# Patient Record
Sex: Female | Born: 1995 | Race: White | Hispanic: No | Marital: Single | Smoking: Current every day smoker
Health system: Southern US, Community
[De-identification: ages and names within clinical notes are randomized; demographics above are authoritative.]

## PROBLEM LIST (undated history)

## (undated) ENCOUNTER — Inpatient Hospital Stay (HOSPITAL_COMMUNITY): Payer: Self-pay

## (undated) DIAGNOSIS — F909 Attention-deficit hyperactivity disorder, unspecified type: Secondary | ICD-10-CM

## (undated) DIAGNOSIS — F40240 Claustrophobia: Secondary | ICD-10-CM

## (undated) DIAGNOSIS — H7191 Unspecified cholesteatoma, right ear: Secondary | ICD-10-CM

## (undated) HISTORY — PX: WISDOM TOOTH EXTRACTION: SHX21

## (undated) HISTORY — PX: OTHER SURGICAL HISTORY: SHX169

---
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
## 2017-08-21 ENCOUNTER — Encounter (HOSPITAL_COMMUNITY): Payer: Self-pay | Admitting: *Deleted

## 2017-08-21 ENCOUNTER — Emergency Department (HOSPITAL_COMMUNITY): Payer: No Typology Code available for payment source

## 2017-08-21 ENCOUNTER — Emergency Department (HOSPITAL_COMMUNITY)
Admission: EM | Admit: 2017-08-21 | Discharge: 2017-08-21 | Disposition: A | Discharge status: Home / Self Care | Payer: No Typology Code available for payment source | Attending: Emergency Medicine | Admitting: Emergency Medicine

## 2017-08-21 DIAGNOSIS — Y92410 Unspecified street and highway as the place of occurrence of the external cause: Secondary | ICD-10-CM | POA: Insufficient documentation

## 2017-08-21 DIAGNOSIS — S1091XA Abrasion of unspecified part of neck, initial encounter: Secondary | ICD-10-CM | POA: Insufficient documentation

## 2017-08-21 DIAGNOSIS — S0091XA Abrasion of unspecified part of head, initial encounter: Secondary | ICD-10-CM | POA: Diagnosis not present

## 2017-08-21 DIAGNOSIS — S060X0A Concussion without loss of consciousness, initial encounter: Secondary | ICD-10-CM

## 2017-08-21 DIAGNOSIS — F17210 Nicotine dependence, cigarettes, uncomplicated: Secondary | ICD-10-CM | POA: Diagnosis not present

## 2017-08-21 DIAGNOSIS — S298XXA Other specified injuries of thorax, initial encounter: Secondary | ICD-10-CM | POA: Diagnosis not present

## 2017-08-21 DIAGNOSIS — S838X2A Sprain of other specified parts of left knee, initial encounter: Secondary | ICD-10-CM | POA: Diagnosis not present

## 2017-08-21 DIAGNOSIS — Y999 Unspecified external cause status: Secondary | ICD-10-CM | POA: Insufficient documentation

## 2017-08-21 DIAGNOSIS — Z79899 Other long term (current) drug therapy: Secondary | ICD-10-CM | POA: Diagnosis not present

## 2017-08-21 DIAGNOSIS — S838X1A Sprain of other specified parts of right knee, initial encounter: Secondary | ICD-10-CM | POA: Diagnosis not present

## 2017-08-21 DIAGNOSIS — Y9389 Activity, other specified: Secondary | ICD-10-CM | POA: Insufficient documentation

## 2017-08-21 DIAGNOSIS — S29001A Unspecified injury of muscle and tendon of front wall of thorax, initial encounter: Secondary | ICD-10-CM | POA: Diagnosis present

## 2017-08-21 MED ORDER — TETRACAINE HCL 0.5 % OP SOLN
1.0000 [drp] | Freq: Once | OPHTHALMIC | Status: AC
  Administered 2017-08-21: 2 [drp] via OPHTHALMIC
  Filled 2017-08-21: qty 4

## 2017-08-21 MED ORDER — IBUPROFEN 600 MG PO TABS: 600.0000 mg | ORAL_TABLET | Freq: Three times a day (TID) | ORAL | 0 refills | Status: DC | PRN

## 2017-08-21 MED ORDER — ERYTHROMYCIN 5 MG/GM OP OINT
TOPICAL_OINTMENT | Freq: Three times a day (TID) | OPHTHALMIC | Status: DC
  Administered 2017-08-21: 06:00:00 via OPHTHALMIC
  Filled 2017-08-21: qty 3.5

## 2017-08-21 MED ORDER — FENTANYL CITRATE (PF) 100 MCG/2ML IJ SOLN
50.0000 ug | Freq: Once | INTRAMUSCULAR | Status: AC
  Administered 2017-08-21: 50 ug via INTRAVENOUS
  Filled 2017-08-21: qty 2

## 2017-08-21 MED ORDER — FLUORESCEIN SODIUM 0.6 MG OP STRP
ORAL_STRIP | OPHTHALMIC | Status: AC
  Administered 2017-08-21: 1
  Filled 2017-08-21: qty 1

## 2017-08-21 NOTE — ED Notes (Signed)
Dried blood removed from pt forehead & brow. Abrasions cleaned & bleeding controled.
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
## 2017-08-21 NOTE — ED Notes (Signed)
Left eye flushed w/ 30ml of normal saline.

## 2017-08-21 NOTE — ED Notes (Signed)
Pt alert & oriented x4, stable gait. Patient given discharge instructions, paperwork & prescription(s). Patient  instructed to stop at the registration desk to finish any additional paperwork. Patient verbalized understanding. Pt left department w/ no further questions. 

## 2017-08-21 NOTE — ED Notes (Signed)
Pt ambulated around the nurses station w/o complication.
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
## 2018-06-09 ENCOUNTER — Other Ambulatory Visit: Payer: Self-pay | Admitting: Obstetrics and Gynecology

## 2018-06-09 DIAGNOSIS — O3680X0 Pregnancy with inconclusive fetal viability, not applicable or unspecified: Secondary | ICD-10-CM

## 2018-06-10 ENCOUNTER — Ambulatory Visit (INDEPENDENT_AMBULATORY_CARE_PROVIDER_SITE_OTHER): Payer: Medicaid Other

## 2018-06-10 ENCOUNTER — Encounter (INDEPENDENT_AMBULATORY_CARE_PROVIDER_SITE_OTHER): Payer: Self-pay

## 2018-06-10 ENCOUNTER — Other Ambulatory Visit: Payer: Self-pay | Admitting: Obstetrics and Gynecology

## 2018-06-10 ENCOUNTER — Other Ambulatory Visit: Payer: Medicaid Other

## 2018-06-10 DIAGNOSIS — O3680X0 Pregnancy with inconclusive fetal viability, not applicable or unspecified: Secondary | ICD-10-CM

## 2018-06-10 NOTE — Progress Notes (Signed)
US 6+1 wks,single IUP w/ys,crl 1.59 mm,no fht visualized,normal ovaries bilat,GS 1.76 cm,labs ordered today and f/u ultrasound in 10 days,per Victorino DikeJennifer

## 2018-06-11 LAB — BETA HCG QUANT (REF LAB): hCG Quant: 25438 m[IU]/mL

## 2018-06-11 LAB — PROGESTERONE: Progesterone: 6.2 ng/mL

## 2018-06-12 ENCOUNTER — Telehealth: Payer: Self-pay | Admitting: Adult Health

## 2018-06-12 NOTE — Telephone Encounter (Signed)
Left message to call about labs 

## 2018-06-13 ENCOUNTER — Telehealth: Payer: Self-pay | Admitting: Adult Health

## 2018-06-13 MED ORDER — PROGESTERONE MICRONIZED 200 MG PO CAPS: ORAL_CAPSULE | ORAL | 1 refills | Status: DC

## 2018-06-13 NOTE — Telephone Encounter (Signed)
Patient called stating that she is returning Jennifer's phone call regarding her lab work. Please contact pt

## 2018-06-13 NOTE — Telephone Encounter (Signed)
Pt aware of labs and that progesterone is low at 6.2, will rx Prometrium 200 mg in vagina at hs, keep US appt

## 2018-06-20 ENCOUNTER — Other Ambulatory Visit: Payer: Self-pay | Admitting: Adult Health

## 2018-06-20 DIAGNOSIS — O3680X0 Pregnancy with inconclusive fetal viability, not applicable or unspecified: Secondary | ICD-10-CM

## 2018-06-23 ENCOUNTER — Ambulatory Visit (INDEPENDENT_AMBULATORY_CARE_PROVIDER_SITE_OTHER): Payer: Medicaid Other

## 2018-06-23 ENCOUNTER — Encounter: Payer: Self-pay | Admitting: Adult Health

## 2018-06-23 ENCOUNTER — Ambulatory Visit (INDEPENDENT_AMBULATORY_CARE_PROVIDER_SITE_OTHER): Payer: Medicaid Other | Admitting: Adult Health

## 2018-06-23 VITALS — BP 118/72 | HR 69 | Ht 67.0 in | Wt 199.0 lb

## 2018-06-23 DIAGNOSIS — O039 Complete or unspecified spontaneous abortion without complication: Secondary | ICD-10-CM

## 2018-06-23 DIAGNOSIS — Z3A01 Less than 8 weeks gestation of pregnancy: Secondary | ICD-10-CM | POA: Diagnosis not present

## 2018-06-23 DIAGNOSIS — O3680X0 Pregnancy with inconclusive fetal viability, not applicable or unspecified: Secondary | ICD-10-CM

## 2018-06-23 NOTE — Progress Notes (Signed)
  Subjective:     Patient ID: Monica Vazquez, female   DOB: 05/06/1996, 22 y.o.   MRN: [id number]  HPI Monica Vazquez is a 22 year old white female in for F/U US and US was essentially unchanged from 7/9, except sac more irregular   Review of Systems No bleeding Reviewed past medical,surgical, social and family history. Reviewed medications and allergies.     Objective:   Physical Exam BP 118/72 (BP Location: Left Arm, Patient Position: Sitting, Cuff Size: Normal)   Pulse 69   Ht 5\' 7"  (1.702 m)   Wt 199 lb (90.3 kg)   LMP 04/28/2018 (Exact Date)   BMI 31.17 kg/m    Discussed that US showed irregular GS at 6+4 weeks(Sze<dates), no real growth and CRL 1.88 mm, no FHM. Discussed options of following for now or cytotec, will discuss with partner and let me know. Assessment:     Miscarriage     Plan:    Call me in next day or so and let me know if wants to follow for now or wants Cytotec  Check QHCG and ABO RH F/U in 1 week Review handout on miscarriage

## 2018-06-23 NOTE — Patient Instructions (Signed)

## 2018-06-23 NOTE — Progress Notes (Signed)
T/V US: 6+4 wks irregular GS,crl 1.88 mm,no fht,normal ovaries bilat

## 2018-06-24 LAB — ABO/RH: RH TYPE: POSITIVE

## 2018-06-24 LAB — BETA HCG QUANT (REF LAB): hCG Quant: 7780 m[IU]/mL

## 2018-06-25 ENCOUNTER — Telehealth: Payer: Self-pay | Admitting: Adult Health

## 2018-06-25 ENCOUNTER — Inpatient Hospital Stay (HOSPITAL_COMMUNITY)
Admission: AD | Admit: 2018-06-25 | Discharge: 2018-06-26 | Disposition: A | Discharge status: Home / Self Care | Payer: Medicaid Other | Source: Ambulatory Visit | Attending: Obstetrics & Gynecology | Admitting: Obstetrics & Gynecology

## 2018-06-25 DIAGNOSIS — O021 Missed abortion: Secondary | ICD-10-CM | POA: Insufficient documentation

## 2018-06-25 DIAGNOSIS — Z3A08 8 weeks gestation of pregnancy: Secondary | ICD-10-CM | POA: Insufficient documentation

## 2018-06-25 DIAGNOSIS — O99341 Other mental disorders complicating pregnancy, first trimester: Secondary | ICD-10-CM | POA: Insufficient documentation

## 2018-06-25 DIAGNOSIS — F17210 Nicotine dependence, cigarettes, uncomplicated: Secondary | ICD-10-CM | POA: Insufficient documentation

## 2018-06-25 DIAGNOSIS — F909 Attention-deficit hyperactivity disorder, unspecified type: Secondary | ICD-10-CM | POA: Insufficient documentation

## 2018-06-25 DIAGNOSIS — O99331 Smoking (tobacco) complicating pregnancy, first trimester: Secondary | ICD-10-CM | POA: Insufficient documentation

## 2018-06-25 NOTE — Telephone Encounter (Signed)
Left message that blood type A+ and pregnancy numbers are dropping, and let me know if wants to continue to follow or wants cytotec, can let me know next week at visit.

## 2018-06-25 NOTE — MAU Note (Signed)
Pt states she is here for a 2nd opinion. States she was told that she is 6 weeks ago and was told that they could not find a heartbeat. States she had blood work for hormone level and was not told if they had gone up or down. Pt denies vaginal bleeding or pain. [facility]

## 2018-06-26 DIAGNOSIS — O021 Missed abortion: Secondary | ICD-10-CM | POA: Diagnosis not present

## 2018-06-26 DIAGNOSIS — Z3A08 8 weeks gestation of pregnancy: Secondary | ICD-10-CM | POA: Diagnosis not present

## 2018-06-26 DIAGNOSIS — F909 Attention-deficit hyperactivity disorder, unspecified type: Secondary | ICD-10-CM | POA: Diagnosis not present

## 2018-06-26 DIAGNOSIS — O99331 Smoking (tobacco) complicating pregnancy, first trimester: Secondary | ICD-10-CM | POA: Diagnosis not present

## 2018-06-26 DIAGNOSIS — O99341 Other mental disorders complicating pregnancy, first trimester: Secondary | ICD-10-CM | POA: Diagnosis not present

## 2018-06-26 DIAGNOSIS — F17210 Nicotine dependence, cigarettes, uncomplicated: Secondary | ICD-10-CM | POA: Diagnosis not present

## 2018-06-26 NOTE — MAU Provider Note (Signed)
History     CSN: [id number]  Arrival date and time: 06/25/18 2032   First Provider Initiated Contact with Patient 06/26/18 0004      Chief Complaint  Patient presents with  . Possible Pregnancy   Monica Vazquez is a 22 y.o. G1P0000 who is approx 8 weeks by LMP. She states that she is here for a second opinion. [facility] and per the patient she had an [name] and it was "too early", and then she had another [name] and "there was no heart beat". She states that she was offered cytotec by her OB office, but was not sure if she wanted to take it. She is here today for a second opinion. She denies any bleeding or pain.    OB History    Gravida  1   Para  0   Term  0   Preterm  0   AB  0   Living  0     SAB  0   TAB  0   Ectopic  0   Multiple  0   Live Births              Past Medical History:  Diagnosis Date  . ADHD (attention deficit hyperactivity disorder)   . 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 Claustrophobia     Past Surgical History:  Procedure Laterality Date  . implanon    . TYMPANOMASTOIDECTOMY Right 09/12/2015   Procedure: RIGHT MODIFIED RADICAL TYMPANOMASTOIDECTOMY;  Surgeon: Newman Pies, MD;  Location: [facility];  Service: ENT;  Laterality: Right;  . WISDOM TOOTH EXTRACTION      Family History  Problem Relation Age of Onset  . Hypertension Father   . Heart disease Father   . Renal cancer Father     Social History   Tobacco Use  . Smoking status: Current Every Day Smoker    Packs/day: 0.00    Years: 3.00    Pack years: 0.00    Types: Cigarettes  . Smokeless tobacco: Never Used  . Tobacco comment: smokes 3-4 cig daily  Substance Use Topics  . Alcohol use: No  . Drug use: No    Allergies:  Allergies  Allergen Reactions  . Hydrocodone Itching    SKIN BURNING    Medications Prior to Admission  Medication Sig Dispense Refill Last Dose  . progesterone (PROMETRIUM) 200 MG capsule Place 1  capsule in vagina at HS 30 capsule 1 Taking    Review of Systems  Constitutional: Negative for chills and fever.  Gastrointestinal: Negative for nausea and vomiting.  Genitourinary: Negative for dysuria, frequency, pelvic pain, vaginal bleeding and vaginal discharge.   Physical Exam   Blood pressure 126/79, pulse 97, temperature 98.2 F (36.8 C), temperature source Oral, resp. rate 18, height 5\' 7"  (1.702 m), weight 199 lb (90.3 kg), last menstrual period 04/28/2018, SpO2 97 %.  Physical Exam  Nursing note and vitals reviewed. Constitutional: She is oriented to person, place, and time. She appears well-developed and well-nourished. No distress.  HENT:  Head: Normocephalic.  Cardiovascular: Normal rate.  Respiratory: Effort normal.  GI: Soft. There is no tenderness. There is no rebound.  Neurological: She is alert and oriented to person, place, and time.  Skin: Skin is warm and dry.  Psychiatric: She has a normal mood and affect.    MAU Course  Procedures  MDM Reviewed [name] and HCG from 06/09/18 at [facility] and [name] and HCG  from 7/22 with the patient. Told her that this is indicative of a miscarriage, and that waiting or taking cytotec options at this time. Patient states that her office had not sent in cytotec. Offered to send in rx for her at this time. She declines at this time and states, "I need to think about this some more". Emotional support provided. Advised patient to FU with her OB office for further management as needed.   Assessment and Plan   1. Missed abortion    DC home Comfort measures reviewed  Bleeding precautions RX: none  Return to MAU as needed FU with OB as planned  Follow-up Information    [facility] Follow up.   Specialty:  Obstetrics and Gynecology Contact information: [address] [place] [id number] [phone]           Thressa ShellerHeather Pieter Fooks 06/26/2018, 12:08 AM

## 2018-06-26 NOTE — Discharge Instructions (Signed)

## 2018-06-30 ENCOUNTER — Encounter: Payer: Self-pay | Admitting: Adult Health

## 2018-06-30 ENCOUNTER — Ambulatory Visit (INDEPENDENT_AMBULATORY_CARE_PROVIDER_SITE_OTHER): Payer: Medicaid Other | Admitting: Adult Health

## 2018-06-30 VITALS — BP 111/76 | HR 75 | Ht 67.0 in | Wt 202.0 lb

## 2018-06-30 DIAGNOSIS — O039 Complete or unspecified spontaneous abortion without complication: Secondary | ICD-10-CM | POA: Diagnosis not present

## 2018-06-30 NOTE — Progress Notes (Signed)
  Subjective:     Patient ID: Monica Vazquez, female   DOB: 04/17/1996, 22 y.o.   MRN: [id number]  HPI Monica Vazquez is a 22 year old white female, she had US with no growth or FHM and QHCG dropping, no bleeding as of yet, ABO RH is A+.  Review of Systems No bleeding yet Reviewed past medical,surgical, social and family history. Reviewed medications and allergies.     Objective:   Physical Exam BP 111/76 (BP Location: Left Arm, Patient Position: Sitting, Cuff Size: Small)   Pulse 75   Ht 5\' 7"  (1.702 m)   Wt 202 lb (91.6 kg)   LMP 04/28/2018 (Exact Date)   BMI 31.64 kg/m  Skin warm and dry. Lungs: clear to ausculation bilaterally. Cardiovascular: regular rate and rhythm. She still wants to wait for now, [facility],     Assessment:     1. Miscarriage       Plan:     Check QHCG F/U in 2 weeks

## 2018-07-01 ENCOUNTER — Telehealth: Payer: Self-pay | Admitting: Adult Health

## 2018-07-01 LAB — BETA HCG QUANT (REF LAB): hCG Quant: 3329 m[IU]/mL

## 2018-07-01 NOTE — Telephone Encounter (Signed)
Pt aware that numbers have dropped to [phone],329, keep appt as scheduled.

## 2018-07-14 ENCOUNTER — Ambulatory Visit (INDEPENDENT_AMBULATORY_CARE_PROVIDER_SITE_OTHER): Payer: Self-pay | Admitting: Adult Health

## 2018-07-14 ENCOUNTER — Encounter (INDEPENDENT_AMBULATORY_CARE_PROVIDER_SITE_OTHER): Payer: Self-pay

## 2018-07-14 ENCOUNTER — Encounter: Payer: Self-pay | Admitting: Adult Health

## 2018-07-14 VITALS — BP 103/60 | HR 76 | Ht 67.0 in | Wt 203.0 lb

## 2018-07-14 DIAGNOSIS — O039 Complete or unspecified spontaneous abortion without complication: Secondary | ICD-10-CM

## 2018-07-14 NOTE — Progress Notes (Signed)
  Subjective:     Patient ID: Monica Vazquez, female   DOB: 05/17/1996, 22 y.o.   MRN: [id number]  HPI Monica Vazquez is a 22 year old white female back in follow up on miscarriage, and she did bleed for about 6 days, starting 8/5.Last QHCG was 3,329 on 06/30/18. Blood type is A+.   Review of Systems Had bleeding about 8-5 till 8-11, some clots Reviewed past medical,surgical, social and family history. Reviewed medications and allergies.     Objective:   Physical Exam BP 103/60 (BP Location: Left Arm, Patient Position: Sitting, Cuff Size: Large)   Pulse 76   Ht 5\' 7"  (1.702 m)   Wt 203 lb (92.1 kg)   LMP 07/07/2018 (Approximate)   BMI 31.79 kg/m  Skin warm and dry. Neck: mid line trachea, normal thyroid, good ROM, no lymphadenopathy noted. Lungs: clear to ausculation bilaterally. Cardiovascular: regular rate and rhythm.    Assessment:     1. Miscarriage       Plan:     [facility] Use condoms for now,when QHCG<5 can resume trying to get pregnant  F/U prn

## 2018-07-15 ENCOUNTER — Telehealth: Payer: Self-pay | Admitting: Adult Health

## 2018-07-15 LAB — BETA HCG QUANT (REF LAB): hCG Quant: 261 m[IU]/mL

## 2018-07-15 NOTE — Telephone Encounter (Signed)
[phone], recheck in 2 weeks

## 2018-08-28 ENCOUNTER — Emergency Department (HOSPITAL_COMMUNITY)
Admission: EM | Admit: 2018-08-28 | Discharge: 2018-08-28 | Disposition: A | Discharge status: Home / Self Care | Payer: Medicaid Other | Attending: Emergency Medicine | Admitting: Emergency Medicine

## 2018-08-28 ENCOUNTER — Other Ambulatory Visit: Payer: Self-pay

## 2018-08-28 ENCOUNTER — Encounter (HOSPITAL_COMMUNITY): Payer: Self-pay | Admitting: *Deleted

## 2018-08-28 DIAGNOSIS — F17210 Nicotine dependence, cigarettes, uncomplicated: Secondary | ICD-10-CM | POA: Insufficient documentation

## 2018-08-28 DIAGNOSIS — H9201 Otalgia, right ear: Secondary | ICD-10-CM | POA: Insufficient documentation

## 2018-08-28 MED ORDER — OFLOXACIN 0.3 % OT SOLN: 10.0000 [drp] | Freq: Two times a day (BID) | OTIC | 0 refills | Status: DC

## 2018-08-28 NOTE — Discharge Instructions (Signed)
We recommend the use of ofloxacin eardrops as prescribed for 14 days.  You may take 600 mg ibuprofen every 6 hours as needed for ongoing ear ache. Follow up with Dr. Suszanne Conners to ensure resolution of symptoms.

## 2018-08-28 NOTE — ED Triage Notes (Signed)
States she was dx. With a ear disease, c/o seh thinks its coming back c/o left earache x 1 month getting worse.

## 2018-08-28 NOTE — ED Provider Notes (Signed)
[facility] EMERGENCY DEPARTMENT Provider Note   CSN: [id number] Arrival date & time: 08/28/18  0348     History   Chief Complaint Chief Complaint  Patient presents with  . Otalgia    HPI Monica Vazquez is a 22 y.o. female.   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 Notes pain x 3 months which has worsened over the past week. She states that she has experienced increased discharge from the right ear canal with intermittent bleeding. Pain unrelieved with OTC medications. She has been unable to follow up with Dr. Suszanne Conners secondary to loss of insurance coverage. No associated fevers, trauma.  The history is provided by the patient. No language interpreter was used.  Otalgia     Past Medical History:  Diagnosis Date  . ADHD (attention deficit hyperactivity disorder)   . 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 Claustrophobia     There are no active problems to display for this patient.   Past Surgical History:  Procedure Laterality Date  . implanon    . TYMPANOMASTOIDECTOMY Right 09/12/2015   Procedure: RIGHT MODIFIED RADICAL TYMPANOMASTOIDECTOMY;  Surgeon: Newman Pies, MD;  Location:  SUR[facility]ERY C[facility];  Service: ENT;  Laterality: Right;  . WISDOM TOOTH EXTRACTION       OB History    Gravida  1   Para  0   Term  0   Preterm  0   AB  0   Living  0     SAB  0   TAB  0   Ectopic  0   Multiple  0   Live Births               Home Medications    Prior to Admission medications   Medication Sig Start Date End Date Taking? Authorizing Provider  ofloxacin (FLOXIN) 0.3 % OTIC solution Place 10 drops into the right ear 2 (two) times daily. 08/28/18   Antony Madura, PA-C    Family History Family History  Problem Relation Age of Onset  . Hypertension Father   . Heart disease Father   . Renal cancer Father     Social History Social History    Tobacco Use  . Smoking status: Current Every Day Smoker    Packs/day: 0.00    Years: 3.00    Pack years: 0.00    Types: Cigarettes  . Smokeless tobacco: Never Used  . Tobacco comment: smokes 3-4 cig daily  Substance Use Topics  . Alcohol use: No  . Drug use: No     Allergies   Hydrocodone   Review of Systems Review of Systems  HENT: Positive for ear pain.   Ten systems reviewed and are negative for acute change, except as noted in the HPI.    Physical Exam Updated Vital Signs BP 137/77 (BP Location: Right Arm)   Pulse 72   Temp 97.8 F (36.6 C) (Oral)   Resp 18   Ht 5\' 7"  (1.702 m)   Wt 90.7 kg   LMP 07/07/2018 (Approximate)   SpO2 100%   BMI 31.32 kg/m   Physical Exam  Constitutional: She is oriented to person, place, and time. She appears well-developed and well-nourished. No distress.  Nontoxic appearing and in NAD  HENT:  Head: Normocephalic and atraumatic.  Normal left external ear, canal and TM. Right ear with large amount of thick, clumped, waxy  appearing discharge; foul smelling. No active bleeding. TM difficult to discern given hx, but visualized area noted to be scarred. No distinct evidence of TM perforation.  Eyes: Conjunctivae and EOM are normal. No scleral icterus.  Neck: Normal range of motion.  No meningismus   Pulmonary/Chest: Effort normal. No respiratory distress.  Respirations even and unlabored  Musculoskeletal: Normal range of motion.  Neurological: She is alert and oriented to person, place, and time. She exhibits normal muscle tone. Coordination normal.  Skin: Skin is warm and dry. No rash noted. She is not diaphoretic. No erythema. No pallor.  Psychiatric: She has a normal mood and affect. Her behavior is normal.  Nursing note and vitals reviewed.    ED Treatments / Results  Labs (all labs ordered are listed, but only abnormal results are displayed) Labs Reviewed - No data to display  EKG None  Radiology No results  found.  Procedures Procedures (including critical care time)  Medications Ordered in ED Medications - No data to display   Initial Impression / Assessment and Plan / ED Course  I have reviewed the triage vital signs and the nursing notes.  Pertinent labs & imaging results that were available during my care of the patient were reviewed by me and considered in my medical decision making (see chart for details).     Patient presenting for chronic, recurrent R sided otalgia which has been worse x 1 week. Symptoms associated with discharge/drainage from the ear canal with intermittent bleeding. Exam concerning for recurrence of prior cholesteatoma.  Symptoms previously managed with surgery, but patient reports loss of insurance coverage. Discharge in the ear is thick, waxy, foul smelling. The patient is afebrile without meningismus. No erythema posterior to external R ear.   Will manage with topical Ofloxacin otic. Ciprodex would be a better choice, but options are a bit limited due to medication cost. Patient advised to continue Motrin 400mg  q 6 hours for pain. ENT follow up encouraged. Return precautions discussed and provided. Patient discharged in stable condition with no unaddressed concerns.   Final Clinical Impressions(s) / ED Diagnoses   Final diagnoses:  Otalgia of right ear    ED Discharge Orders         Ordered    ofloxacin (FLOXIN) 0.3 % OTIC solution  2 times daily     08/28/18 0443           Antony Madura, PA-C 08/28/18 0454    Dione Booze, MD 08/28/18 [phone]

## 2019-01-28 ENCOUNTER — Other Ambulatory Visit: Payer: Self-pay

## 2019-01-28 ENCOUNTER — Emergency Department (HOSPITAL_COMMUNITY): Payer: Medicaid Other

## 2019-01-28 ENCOUNTER — Emergency Department (HOSPITAL_COMMUNITY)
Admission: EM | Admit: 2019-01-28 | Discharge: 2019-01-28 | Disposition: A | Discharge status: Home / Self Care | Payer: Medicaid Other | Attending: Emergency Medicine | Admitting: Emergency Medicine

## 2019-01-28 ENCOUNTER — Encounter (HOSPITAL_COMMUNITY): Payer: Self-pay

## 2019-01-28 DIAGNOSIS — N390 Urinary tract infection, site not specified: Secondary | ICD-10-CM | POA: Diagnosis not present

## 2019-01-28 DIAGNOSIS — F1390 Sedative, hypnotic, or anxiolytic use, unspecified, uncomplicated: Secondary | ICD-10-CM | POA: Diagnosis not present

## 2019-01-28 DIAGNOSIS — R569 Unspecified convulsions: Secondary | ICD-10-CM | POA: Diagnosis present

## 2019-01-28 DIAGNOSIS — F17210 Nicotine dependence, cigarettes, uncomplicated: Secondary | ICD-10-CM | POA: Insufficient documentation

## 2019-01-28 DIAGNOSIS — F1490 Cocaine use, unspecified, uncomplicated: Secondary | ICD-10-CM | POA: Diagnosis not present

## 2019-01-28 DIAGNOSIS — F1190 Opioid use, unspecified, uncomplicated: Secondary | ICD-10-CM | POA: Insufficient documentation

## 2019-01-28 DIAGNOSIS — F1590 Other stimulant use, unspecified, uncomplicated: Secondary | ICD-10-CM | POA: Diagnosis not present

## 2019-01-28 DIAGNOSIS — F1290 Cannabis use, unspecified, uncomplicated: Secondary | ICD-10-CM | POA: Diagnosis not present

## 2019-01-28 LAB — CBC WITH DIFFERENTIAL/PLATELET
Abs Immature Granulocytes: 0.07 10*3/uL (ref 0.00–0.07)
BASOS ABS: 0 10*3/uL (ref 0.0–0.1)
Basophils Relative: 0 %
EOS PCT: 0 %
Eosinophils Absolute: 0 10*3/uL (ref 0.0–0.5)
HEMATOCRIT: 43.5 % (ref 36.0–46.0)
HEMOGLOBIN: 13.8 g/dL (ref 12.0–15.0)
IMMATURE GRANULOCYTES: 1 %
LYMPHS ABS: 1.6 10*3/uL (ref 0.7–4.0)
LYMPHS PCT: 11 %
MCH: 28 pg (ref 26.0–34.0)
MCHC: 31.7 g/dL (ref 30.0–36.0)
MCV: 88.4 fL (ref 80.0–100.0)
MONO ABS: 1 10*3/uL (ref 0.1–1.0)
MONOS PCT: 7 %
NEUTROS ABS: 11.6 10*3/uL — AB (ref 1.7–7.7)
Neutrophils Relative %: 81 %
Platelets: 249 10*3/uL (ref 150–400)
RBC: 4.92 MIL/uL (ref 3.87–5.11)
RDW: 13.9 % (ref 11.5–15.5)
WBC: 14.3 10*3/uL — ABNORMAL HIGH (ref 4.0–10.5)
nRBC: 0 % (ref 0.0–0.2)

## 2019-01-28 LAB — URINALYSIS, ROUTINE W REFLEX MICROSCOPIC
Bilirubin Urine: NEGATIVE
Glucose, UA: NEGATIVE mg/dL
Ketones, ur: 5 mg/dL — AB
Nitrite: POSITIVE — AB
Protein, ur: NEGATIVE mg/dL
Specific Gravity, Urine: 1.019 (ref 1.005–1.030)
pH: 5 (ref 5.0–8.0)

## 2019-01-28 LAB — RAPID URINE DRUG SCREEN, HOSP PERFORMED
Amphetamines: POSITIVE — AB
BARBITURATES: NOT DETECTED
Benzodiazepines: POSITIVE — AB
COCAINE: POSITIVE — AB
Opiates: POSITIVE — AB
Tetrahydrocannabinol: POSITIVE — AB

## 2019-01-28 LAB — COMPREHENSIVE METABOLIC PANEL
ALT: 13 U/L (ref 0–44)
AST: 17 U/L (ref 15–41)
Albumin: 4.4 g/dL (ref 3.5–5.0)
Alkaline Phosphatase: 50 U/L (ref 38–126)
Anion gap: 10 (ref 5–15)
BUN: 7 mg/dL (ref 6–20)
CO2: 21 mmol/L — AB (ref 22–32)
Calcium: 9.2 mg/dL (ref 8.9–10.3)
Chloride: 107 mmol/L (ref 98–111)
Creatinine, Ser: 0.61 mg/dL (ref 0.44–1.00)
GFR calc Af Amer: >60 mL/min (ref >60)
GFR calc non Af Amer: >60 mL/min (ref >60)
Glucose, Bld: 119 mg/dL — ABNORMAL HIGH (ref 70–99)
Potassium: 3.4 mmol/L — ABNORMAL LOW (ref 3.5–5.1)
Sodium: 138 mmol/L (ref 135–145)
Total Bilirubin: 0.4 mg/dL (ref 0.3–1.2)
Total Protein: 7.3 g/dL (ref 6.5–8.1)

## 2019-01-28 LAB — PREGNANCY, URINE: Preg Test, Ur: NEGATIVE

## 2019-01-28 MED ORDER — CEPHALEXIN 500 MG PO CAPS: 500.0000 mg | ORAL_CAPSULE | Freq: Four times a day (QID) | ORAL | 0 refills | Status: DC

## 2019-01-28 MED ORDER — CEPHALEXIN 500 MG PO CAPS
1000.0000 mg | ORAL_CAPSULE | Freq: Once | ORAL | Status: AC
  Administered 2019-01-28: 1000 mg via ORAL
  Filled 2019-01-28: qty 2

## 2019-01-28 NOTE — ED Notes (Signed)
Patient transported to CT 

## 2019-01-28 NOTE — ED Notes (Signed)
Trula Ore, RN at bedside

## 2019-01-28 NOTE — ED Provider Notes (Signed)
[facility] EMERGENCY DEPARTMENT Provider Note   CSN: [id number] Arrival date & time: 01/28/19  1449    History   Chief Complaint Chief Complaint  Patient presents with  . Seizures    HPI Monica Vazquez is a 23 y.o. female.     Level 5 caveat for uncertainty of history.  Patient reports she had a "seizure" at a local motel today.  She was with her boyfriend and they were "doing drugs".  I asked her how she knew she had a seizure and she replied " I'm not sure".  No postictal behavior noted in the emergency department.  Previous history of seizures.  She claims to be normally healthy.  PMH ADHD     Past Medical History:  Diagnosis Date  . ADHD (attention deficit hyperactivity disorder)   . 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 Claustrophobia     There are no active problems to display for this patient.   Past Surgical History:  Procedure Laterality Date  . implanon    . TYMPANOMASTOIDECTOMY Right 09/12/2015   Procedure: RIGHT MODIFIED RADICAL TYMPANOMASTOIDECTOMY;  Surgeon: Newman Pies, MD;  Location: [facility];  Service: ENT;  Laterality: Right;  . WISDOM TOOTH EXTRACTION       OB History    Gravida  1   Para  0   Term  0   Preterm  0   AB  0   Living  0     SAB  0   TAB  0   Ectopic  0   Multiple  0   Live Births               Home Medications    Prior to Admission medications   Medication Sig Start Date End Date Taking? Authorizing Provider  oxyCODONE (OXY IR/ROXICODONE) 5 MG immediate release tablet Take 5 mg by mouth daily as needed for severe pain.   Yes [provider]  cephALEXin (KEFLEX) 500 MG capsule Take 1 capsule (500 mg total) by mouth 4 (four) times daily. 01/28/19   Donnetta Hutching, MD    Family History Family History  Problem Relation Age of Onset  . Hypertension Father   . Heart disease Father   . Renal cancer Father     Social History Social History   Tobacco Use  . Smoking status:  Current Every Day Smoker    Packs/day: 0.00    Years: 3.00    Pack years: 0.00    Types: Cigarettes  . Smokeless tobacco: Never Used  . Tobacco comment: smokes 3-4 cig daily  Substance Use Topics  . Alcohol use: No  . Drug use: No     Allergies   Hydrocodone   Review of Systems Review of Systems  All other systems reviewed and are negative.    Physical Exam Updated Vital Signs BP 129/79   Pulse 68   Temp 98.5 F (36.9 C) (Oral)   Resp 14   Ht 5\' 7"  (1.702 m)   LMP 12/07/2018   SpO2 100%   BMI 31.32 kg/m   Physical Exam Vitals signs and nursing note reviewed.  Constitutional:      Appearance: She is well-developed.  HENT:     Head: Normocephalic and atraumatic.  Eyes:     Conjunctiva/sclera: Conjunctivae normal.  Neck:     Musculoskeletal: Neck supple.  Cardiovascular:     Rate and Rhythm: Normal rate and regular rhythm.  Pulmonary:     Effort:  Pulmonary effort is normal.     Breath sounds: Normal breath sounds.  Abdominal:     General: Bowel sounds are normal.     Palpations: Abdomen is soft.  Musculoskeletal: Normal range of motion.  Skin:    General: Skin is warm and dry.  Neurological:     Mental Status: She is alert and oriented to person, place, and time.  Psychiatric:        Behavior: Behavior normal.      ED Treatments / Results  Labs (all labs ordered are listed, but only abnormal results are displayed) Labs Reviewed  CBC WITH DIFFERENTIAL/PLATELET - Abnormal; Notable for the following components:      Result Value   WBC 14.3 (*)    Neutro Abs 11.6 (*)    All other components within normal limits  COMPREHENSIVE METABOLIC PANEL - Abnormal; Notable for the following components:   Potassium 3.4 (*)    CO2 21 (*)    Glucose, Bld 119 (*)    All other components within normal limits  RAPID URINE DRUG SCREEN, HOSP PERFORMED - Abnormal; Notable for the following components:   Opiates POSITIVE (*)    Cocaine POSITIVE (*)     Benzodiazepines POSITIVE (*)    Amphetamines POSITIVE (*)    Tetrahydrocannabinol POSITIVE (*)    All other components within normal limits  URINALYSIS, ROUTINE W REFLEX MICROSCOPIC - Abnormal; Notable for the following components:   Color, Urine AMBER (*)    APPearance CLOUDY (*)    Hgb urine dipstick SMALL (*)    Ketones, ur 5 (*)    Nitrite POSITIVE (*)    Leukocytes,Ua MODERATE (*)    Bacteria, UA FEW (*)    All other components within normal limits  PREGNANCY, URINE    EKG EKG Interpretation  Date/Time:  Wednesday January 28 2019 15:14:28 EST Ventricular Rate:  84 PR Interval:    QRS Duration: 95 QT Interval:  366 QTC Calculation: 433 R Axis:   108 Text Interpretation:  Sinus rhythm Borderline right axis deviation Confirmed by Donnetta Hutching ([id number]) on 01/28/2019 3:32:33 PM   Radiology Ct Head Wo Contrast  Result Date: 01/28/2019 CLINICAL DATA:  Seizure EXAM: CT HEAD WITHOUT CONTRAST TECHNIQUE: Contiguous axial images were obtained from the base of the skull through the vertex without intravenous contrast. COMPARISON:  August 21, 2017 FINDINGS: Brain: The ventricles are normal in size and configuration. There is no intracranial mass, hemorrhage, extra-axial fluid collection, or midline shift. Brain parenchyma appears normal. No demonstrable acute infarct. Vascular: No hyperdense vessel. There is no appreciable vascular calcification. Skull: The bony calvarium appears intact. Sinuses/Orbits: There is mucosal thickening in several ethmoid air cells. Other visualized paranasal sinuses are clear. Visualized orbits appear symmetric bilaterally. Other: Mastoids on the left are clear. The patient has had a previous mastoidectomy on the right. There is a radiopaque foreign body in the right external auditory canal. IMPRESSION: Brain parenchyma appears unremarkable.  No mass or hemorrhage. Status post right mastoidectomy. Radiopaque foreign body in the right external auditory canal. There  is mucosal thickening in several ethmoid air cells. Electronically Signed   By: Bretta Bang III M.D.   On: 01/28/2019 16:45    Procedures Procedures (including critical care time)  Medications Ordered in ED Medications  cephALEXin (KEFLEX) capsule 1,000 mg (1,000 mg Oral Given 01/28/19 2008)     Initial Impression / Assessment and Plan / ED Course  I have reviewed the triage vital signs and the nursing notes.  Pertinent labs & imaging results that were available during my care of the patient were reviewed by me and considered in my medical decision making (see chart for details).        Patient has a normal physical exam.  History not specifically suggestive of a seizure.  CT head negative.  Drug screen positive for opiates, cocaine, amphetamines, THC, benzodiazepines.  I suspect that polypharmacy played a role in her symptom complex.  I discussed the findings with the patient and her mother.  We will also Rx for a urinary tract infection with cephalexin 500 mg.   CRITICAL CARE Performed by: Donnetta Hutching Total critical care time: 30 minutes Critical care time was exclusive of separately billable procedures and treating other patients. Critical care was necessary to treat or prevent imminent or life-threatening deterioration. Critical care was time spent personally by me on the following activities: development of treatment plan with patient and/or surrogate as well as nursing, discussions with consultants, evaluation of patient's response to treatment, examination of patient, obtaining history from patient or surrogate, ordering and performing treatments and interventions, ordering and review of laboratory studies, ordering and review of radiographic studies, pulse oximetry and re-evaluation of patient's condition.  Final Clinical Impressions(s) / ED Diagnoses   Final diagnoses:  Urinary tract infection without hematuria, site unspecified    ED Discharge Orders          Ordered    cephALEXin (KEFLEX) 500 MG capsule  4 times daily     01/28/19 2038           Donnetta Hutching, MD 01/28/19 2209

## 2019-01-28 NOTE — ED Notes (Signed)
Patient attempted to provide urine specimen.  Unable to provide at this time.  Patient stated that she went to bathroom approximately 1 hour ago and did not notify anyone she was going to the bathroom.  We advised patient that we needed urine specimen asap.  She stated she will try again in 15-20 minutes.  She has urine cup for specimen.

## 2019-01-28 NOTE — Discharge Instructions (Signed)
Increase fluids.  Prescription for antibiotic.  You need to get a primary care relationship.  Phone number given.

## 2019-01-28 NOTE — ED Notes (Signed)
Patient given oral fluids. Patient comfortable.

## 2019-01-28 NOTE — ED Triage Notes (Addendum)
Pts boyfriend stated she had a seizure and turned blue. Boyfriend started CPR and stated she stopped breathing.  Was postictal upon EMS arrival. Boyfriend threw cold water all over her. Pt alert and oriented upon arrival to ED

## 2019-01-28 NOTE — ED Notes (Signed)
Notified by another nurse that patient walked out in hospital gown. This nurse went outside and found patient on sidewalk smoking. Advised patient she could not leave with IV in place. Patient states she removed IV herself due to pain to IV site. Patient states she does not want to leave. Escorted patient back to room and advised of need for urine sample. Patient ambulated to bathroom to obtain urine sample.

## 2019-01-28 NOTE — ED Notes (Addendum)
Pt admits to smoking weed, taking a Roxi, and also drinking alcohol. Does NOT want her family aware that she took any drugs.

## 2019-03-16 ENCOUNTER — Telehealth: Payer: Self-pay | Admitting: *Deleted

## 2019-03-16 NOTE — Telephone Encounter (Signed)
Spoke with pt. Has appt tomorrow for pregnancy test. Advised we can do this as webex visit. Pt installed app while on the phone. Advised we will call as close to her appt time as possible. Pt voiced understanding. JSY

## 2019-03-17 ENCOUNTER — Encounter: Payer: Self-pay | Admitting: Adult Health

## 2019-03-17 ENCOUNTER — Other Ambulatory Visit: Payer: Self-pay

## 2019-03-17 ENCOUNTER — Ambulatory Visit (INDEPENDENT_AMBULATORY_CARE_PROVIDER_SITE_OTHER): Payer: Self-pay | Admitting: Adult Health

## 2019-03-17 VITALS — Wt 165.0 lb

## 2019-03-17 DIAGNOSIS — N926 Irregular menstruation, unspecified: Secondary | ICD-10-CM | POA: Insufficient documentation

## 2019-03-17 DIAGNOSIS — Z3202 Encounter for pregnancy test, result negative: Secondary | ICD-10-CM

## 2019-03-17 NOTE — Progress Notes (Addendum)
   TELEHEALTH VIRTUAL GYNECOLOGY VISIT ENCOUNTER NOTE  I connected with Monica Vazquez on 03/17/19 at  2:30 PM EDT by telephone at home and verified that I am speaking with the correct person using two identifiers.   I discussed the limitations, risks, security and privacy concerns of performing an evaluation and management service by telephone and the availability of in person appointments. I also discussed with the patient that there may be a patient responsible charge related to this service. The patient expressed understanding and agreed to proceed.   History:  Monica Vazquez is a 23 y.o. [id number] female being evaluated today for having missed 2 periods and had 5+HPTs then 5 negtive HPTs.LMP was 12/07/2018 for 3 weeks and then skipped February and March.Bleeding started Thursday and stopped yesterday, no cramping involved.  She denies any abnormal vaginal discharge, pelvic pain or other concerns.      Past Medical History:  Diagnosis Date  . ADHD (attention deficit hyperactivity disorder)   . 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 Claustrophobia    Past Surgical History:  Procedure Laterality Date  . implanon    . TYMPANOMASTOIDECTOMY Right 09/12/2015   Procedure: RIGHT MODIFIED RADICAL TYMPANOMASTOIDECTOMY;  Surgeon: Newman Pies, MD;  Location: [facility];  Service: ENT;  Laterality: Right;  . WISDOM TOOTH EXTRACTION     The following portions of the patient's history were reviewed and updated as appropriate: allergies, current medications, past family history, past medical history, past social history, past surgical history and problem list.   Health Maintenance: Pap at 23.  Review of Systems:  Pertinent items noted in HPI and remainder of comprehensive ROS otherwise negative.  Physical Exam:   General:  Alert, oriented and cooperative.   Mental Status: Normal mood and affect perceived. Normal judgment and thought content.  Physical exam deferred due to nature of  the encounter Blood type is A+. Will check [facility] and talk after results back, she is aware that could be miscarriage.   Labs and Imaging No results found for this or any previous visit (from the past 336 hour(s)). No results found.    Assessment and Plan:     1. Missed periods - Beta hCG quant (ref lab)  2. Negative pregnancy test - Beta hCG quant (ref lab) Will talk when results back and determine follow up then.   Go ahead and take OTC PNV, no alcohol or drugs    I discussed the assessment and treatment plan with the patient. The patient was provided an opportunity to ask questions and all were answered. The patient agreed with the plan and demonstrated an understanding of the instructions.   The patient was advised to call back or seek an in-person evaluation/go to the ED if the symptoms worsen or if the condition fails to improve as anticipated.  I provided 10  minutes of non-face-to-face time during this encounter.   Cyril Mourning, NP [facility] [employer], [facility]

## 2019-03-20 ENCOUNTER — Telehealth: Payer: Self-pay | Admitting: *Deleted

## 2019-03-20 LAB — BETA HCG QUANT (REF LAB): hCG Quant: <1 m[IU]/mL

## 2019-03-20 NOTE — Telephone Encounter (Signed)
-----   Message from Adline Potter, NP sent at 03/20/2019 12:31 PM EDT ----- Will you let her know that [facility] ,1 so it is negative

## 2019-03-20 NOTE — Telephone Encounter (Signed)
Left message @ 12:38 pm. JSY 

## 2019-03-23 ENCOUNTER — Telehealth: Payer: Self-pay | Admitting: *Deleted

## 2019-03-23 NOTE — Telephone Encounter (Signed)
Mailbox full. No other number listed in chart. Encounter closed. JSY

## 2019-03-23 NOTE — Telephone Encounter (Signed)
Pt aware quant was negative. JSY

## 2019-03-30 ENCOUNTER — Telehealth: Payer: Self-pay | Admitting: *Deleted

## 2019-03-30 NOTE — Telephone Encounter (Signed)
Spoke with pt letting her know again that quant was negative. Pt stated she had no further questions for me. JSY

## 2019-03-30 NOTE — Telephone Encounter (Signed)
Patient's baby daddy called, wants to know if she was really pregnant.  He was extremely rude, doesn't understand why we can't talk to him, tried explaining that to him and advised him that I was not going to continue to argue with him.  I told him someone would call back, but they would HAVE TO speak to her, not him.  [phone]

## 2019-04-30 ENCOUNTER — Emergency Department (HOSPITAL_COMMUNITY): Payer: Medicaid Other

## 2019-04-30 ENCOUNTER — Emergency Department (HOSPITAL_COMMUNITY)
Admission: EM | Admit: 2019-04-30 | Discharge: 2019-05-01 | Disposition: A | Discharge status: Home / Self Care | Payer: Medicaid Other | Attending: Emergency Medicine | Admitting: Emergency Medicine

## 2019-04-30 ENCOUNTER — Other Ambulatory Visit: Payer: Self-pay

## 2019-04-30 ENCOUNTER — Encounter (HOSPITAL_COMMUNITY): Payer: Self-pay | Admitting: *Deleted

## 2019-04-30 DIAGNOSIS — Y9289 Other specified places as the place of occurrence of the external cause: Secondary | ICD-10-CM | POA: Insufficient documentation

## 2019-04-30 DIAGNOSIS — Y9389 Activity, other specified: Secondary | ICD-10-CM | POA: Diagnosis not present

## 2019-04-30 DIAGNOSIS — R569 Unspecified convulsions: Secondary | ICD-10-CM | POA: Insufficient documentation

## 2019-04-30 DIAGNOSIS — Y998 Other external cause status: Secondary | ICD-10-CM | POA: Diagnosis not present

## 2019-04-30 DIAGNOSIS — S0990XA Unspecified injury of head, initial encounter: Secondary | ICD-10-CM | POA: Insufficient documentation

## 2019-04-30 DIAGNOSIS — W19XXXA Unspecified fall, initial encounter: Secondary | ICD-10-CM | POA: Insufficient documentation

## 2019-04-30 LAB — COMPREHENSIVE METABOLIC PANEL
ALT: 13 U/L (ref 0–44)
AST: 16 U/L (ref 15–41)
Albumin: 4 g/dL (ref 3.5–5.0)
Alkaline Phosphatase: 47 U/L (ref 38–126)
Anion gap: 11 (ref 5–15)
BUN: 13 mg/dL (ref 6–20)
CO2: 23 mmol/L (ref 22–32)
Calcium: 9.1 mg/dL (ref 8.9–10.3)
Chloride: 107 mmol/L (ref 98–111)
Creatinine, Ser: 0.76 mg/dL (ref 0.44–1.00)
GFR calc Af Amer: >60 mL/min (ref >60)
GFR calc non Af Amer: >60 mL/min (ref >60)
Glucose, Bld: 97 mg/dL (ref 70–99)
Potassium: 3.6 mmol/L (ref 3.5–5.1)
Sodium: 141 mmol/L (ref 135–145)
Total Bilirubin: 0.4 mg/dL (ref 0.3–1.2)
Total Protein: 7.1 g/dL (ref 6.5–8.1)

## 2019-04-30 LAB — CBC WITH DIFFERENTIAL/PLATELET
Abs Immature Granulocytes: 0.04 10*3/uL (ref 0.00–0.07)
Basophils Absolute: 0 10*3/uL (ref 0.0–0.1)
Basophils Relative: 0 %
Eosinophils Absolute: 0.1 10*3/uL (ref 0.0–0.5)
Eosinophils Relative: 1 %
HCT: 42.9 % (ref 36.0–46.0)
Hemoglobin: 13.6 g/dL (ref 12.0–15.0)
Immature Granulocytes: 0 %
Lymphocytes Relative: 15 %
Lymphs Abs: 1.8 10*3/uL (ref 0.7–4.0)
MCH: 27.9 pg (ref 26.0–34.0)
MCHC: 31.7 g/dL (ref 30.0–36.0)
MCV: 88.1 fL (ref 80.0–100.0)
Monocytes Absolute: 1.2 10*3/uL — ABNORMAL HIGH (ref 0.1–1.0)
Monocytes Relative: 10 %
Neutro Abs: 9 10*3/uL — ABNORMAL HIGH (ref 1.7–7.7)
Neutrophils Relative %: 74 %
Platelets: 208 10*3/uL (ref 150–400)
RBC: 4.87 MIL/uL (ref 3.87–5.11)
RDW: 13.2 % (ref 11.5–15.5)
WBC: 12.1 10*3/uL — ABNORMAL HIGH (ref 4.0–10.5)
nRBC: 0 % (ref 0.0–0.2)

## 2019-04-30 LAB — ETHANOL: Alcohol, Ethyl (B): <10 mg/dL (ref <10)

## 2019-04-30 NOTE — Discharge Instructions (Addendum)
Test showed no obvious abnormalities.  You will need to see a neurologist.  Phone number given.  No driving until cleared by neurologist.

## 2019-04-30 NOTE — ED Triage Notes (Signed)
Pt brought in by [facility] for c/o witnessed seizure; witness states pt hit her head. No hematoma present, when ems arrived they state pt was post-ictal and somewhat confused; pt states she has been stressed lately and she is not currently not on any medications

## 2019-04-30 NOTE — ED Provider Notes (Addendum)
Marland Kitchen. [facility] EMERGENCY DEPARTMENT Provider Note   CSN: [id number] Arrival date & time: 04/30/19  2048    History   Chief Complaint Chief Complaint  Patient presents with  . Seizures    HPI Monica Vazquez is a 23 y.o. female.     Level 5 caveat for amnesia to event.  Patient reports "seizure" today.  She stated she was "shaking all over" and confused after the episode which lasted 2 minutes.  EMS reports this was a witnessed event.  Apparently she hit her head.  This happened 2-3 times in the past.  She has had no formal seizure work-up.  She uses marijuana but no other street drugs.  No alcohol.  No chronic health problems.  Taking no medications.     Past Medical History:  Diagnosis Date  . ADHD (attention deficit hyperactivity disorder)   . 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 Claustrophobia     Patient Active Problem List   Diagnosis Date Noted  . Missed periods 03/17/2019  . Negative pregnancy test 03/17/2019    Past Surgical History:  Procedure Laterality Date  . implanon    . TYMPANOMASTOIDECTOMY Right 09/12/2015   Procedure: RIGHT MODIFIED RADICAL TYMPANOMASTOIDECTOMY;  Surgeon: Newman PiesSu Teoh, MD;  Location: [facility];  Service: ENT;  Laterality: Right;  . WISDOM TOOTH EXTRACTION       OB History    Gravida  2   Para  0   Term  0   Preterm  0   AB  1   Living  0     SAB  0   TAB  0   Ectopic  0   Multiple  0   Live Births               Home Medications    Prior to Admission medications   Not on File    Family History Family History  Problem Relation Age of Onset  . Hypertension Father   . Heart disease Father   . Renal cancer Father     Social History Social History   Tobacco Use  . Smoking status: Current Every Day Smoker    Packs/day: 0.00    Years: 3.00    Pack years: 0.00    Types: Cigarettes  . Smokeless tobacco: Never Used  . Tobacco comment: smokes 3-4 cig daily  Substance Use Topics  .  Alcohol use: No  . Drug use: No     Allergies   Hydrocodone   Review of Systems Review of Systems  Unable to perform ROS: Other     Physical Exam Updated Vital Signs BP 111/65 (BP Location: Left Arm)   Pulse 78   Temp 98.1 F (36.7 C) (Oral)   Resp 16   Ht 5\' 7"  (1.702 m)   Wt 81.6 kg   LMP 12/07/2018   SpO2 98%   BMI 28.19 kg/m   Physical Exam Vitals signs and nursing note reviewed.  Constitutional:      Appearance: She is well-developed.  HENT:     Head: Normocephalic and atraumatic.  Eyes:     Conjunctiva/sclera: Conjunctivae normal.  Neck:     Musculoskeletal: Neck supple.  Cardiovascular:     Rate and Rhythm: Normal rate and regular rhythm.  Pulmonary:     Effort: Pulmonary effort is normal.     Breath sounds: Normal breath sounds.  Abdominal:     General: Bowel sounds are normal.  Palpations: Abdomen is soft.  Musculoskeletal: Normal range of motion.  Skin:    General: Skin is warm and dry.  Neurological:     Mental Status: She is alert and oriented to person, place, and time.     Comments: moving all 4 extremities.  No obvious neurological deficits  Psychiatric:        Behavior: Behavior normal.      ED Treatments / Results  Labs (all labs ordered are listed, but only abnormal results are displayed) Labs Reviewed  CBC WITH DIFFERENTIAL/PLATELET - Abnormal; Notable for the following components:      Result Value   WBC 12.1 (*)    Neutro Abs 9.0 (*)    Monocytes Absolute 1.2 (*)    All other components within normal limits  COMPREHENSIVE METABOLIC PANEL  ETHANOL  RAPID URINE DRUG SCREEN, HOSP PERFORMED    EKG EKG Interpretation  Date/Time:  Thursday Apr 30 2019 20:54:49 EDT Ventricular Rate:  83 PR Interval:    QRS Duration: 95 QT Interval:  362 QTC Calculation: 426 R Axis:   108 Text Interpretation:  Sinus rhythm Short PR interval Borderline right axis deviation Confirmed by Donnetta Hutching ([id number]) on 04/30/2019 10:01:52 PM    Radiology No results found.  Procedures Procedures (including critical care time)  Medications Ordered in ED Medications - No data to display   Initial Impression / Assessment and Plan / ED Course  I have reviewed the triage vital signs and the nursing notes.  Pertinent labs & imaging results that were available during my care of the patient were reviewed by me and considered in my medical decision making (see chart for details).       Patient is alert and oriented x3 with no obvious post ictal symptoms.  Will obtain CT head, labs, drug screen. 2300: No evidence of seizures.  Discussed with Dr. Blinda Leatherwood.  CT head and CT cervical spine negative for acute injuries.  Will obtain work-up as an outpatient. Final Clinical Impressions(s) / ED Diagnoses   Final diagnoses:  Seizure-like activity [facility])    ED Discharge Orders    None       Donnetta Hutching, MD 04/30/19 2209    Donnetta Hutching, MD 05/09/19 [phone]

## 2019-05-01 LAB — RAPID URINE DRUG SCREEN, HOSP PERFORMED
Amphetamines: POSITIVE — AB
Barbiturates: NOT DETECTED
Benzodiazepines: POSITIVE — AB
Cocaine: POSITIVE — AB
Opiates: POSITIVE — AB
Tetrahydrocannabinol: POSITIVE — AB

## 2019-05-01 MED ORDER — LEVETIRACETAM 500 MG PO TABS: 500.0000 mg | ORAL_TABLET | Freq: Two times a day (BID) | ORAL | 2 refills | Status: DC

## 2019-05-01 NOTE — ED Provider Notes (Addendum)
Patient signed out to me to follow-up on CT scans.  Patient was seen by Dr. Adriana Simas earlier today after what sounds like a seizure.  Patient did fall and hit her head.  CT head and cervical spine were performed and neither shows any pathology.  Remainder of her work-up was unremarkable.  She has done well here in the ER.  I reevaluated her and she is awake and alert without complaints currently.  She does tell me that this is possibly the third time that this is happened.  Did not bite her tongue or have incontinence of urine but it does sound like there was a period of confusion and possible postictal state today, therefore will prescribe her Keppra to initiate as an outpatient prior to evaluation by neurology.  Drug Screen, however, is grossly positive.  This likely has something to do with her episode today and prior episodes.   Gilda Crease, MD 05/01/19 Moses Manners    Gilda Crease, MD 05/01/19 [phone]

## 2020-05-28 DIAGNOSIS — Z03818 Encounter for observation for suspected exposure to other biological agents ruled out: Secondary | ICD-10-CM | POA: Diagnosis not present

## 2020-06-10 DIAGNOSIS — Z03818 Encounter for observation for suspected exposure to other biological agents ruled out: Secondary | ICD-10-CM | POA: Diagnosis not present

## 2020-06-24 DIAGNOSIS — Z03818 Encounter for observation for suspected exposure to other biological agents ruled out: Secondary | ICD-10-CM | POA: Diagnosis not present

## 2020-07-08 DIAGNOSIS — Z03818 Encounter for observation for suspected exposure to other biological agents ruled out: Secondary | ICD-10-CM | POA: Diagnosis not present

## 2020-07-22 DIAGNOSIS — Z03818 Encounter for observation for suspected exposure to other biological agents ruled out: Secondary | ICD-10-CM | POA: Diagnosis not present

## 2020-08-05 DIAGNOSIS — Z03818 Encounter for observation for suspected exposure to other biological agents ruled out: Secondary | ICD-10-CM | POA: Diagnosis not present

## 2020-08-19 DIAGNOSIS — Z03818 Encounter for observation for suspected exposure to other biological agents ruled out: Secondary | ICD-10-CM | POA: Diagnosis not present

## 2020-09-02 DIAGNOSIS — Z03818 Encounter for observation for suspected exposure to other biological agents ruled out: Secondary | ICD-10-CM | POA: Diagnosis not present

## 2020-09-12 ENCOUNTER — Encounter (HOSPITAL_COMMUNITY): Payer: Self-pay | Admitting: *Deleted

## 2020-09-12 ENCOUNTER — Other Ambulatory Visit: Payer: Self-pay

## 2020-09-12 ENCOUNTER — Emergency Department (HOSPITAL_COMMUNITY)
Admission: EM | Admit: 2020-09-12 | Discharge: 2020-09-12 | Disposition: A | Discharge status: Home / Self Care | Payer: HRSA Program | Attending: Emergency Medicine | Admitting: Emergency Medicine

## 2020-09-12 DIAGNOSIS — Z5321 Procedure and treatment not carried out due to patient leaving prior to being seen by health care provider: Secondary | ICD-10-CM | POA: Diagnosis not present

## 2020-09-12 DIAGNOSIS — R112 Nausea with vomiting, unspecified: Secondary | ICD-10-CM | POA: Diagnosis not present

## 2020-09-12 DIAGNOSIS — R109 Unspecified abdominal pain: Secondary | ICD-10-CM | POA: Insufficient documentation

## 2020-09-12 DIAGNOSIS — U071 COVID-19: Secondary | ICD-10-CM | POA: Insufficient documentation

## 2020-09-12 DIAGNOSIS — R509 Fever, unspecified: Secondary | ICD-10-CM | POA: Diagnosis present

## 2020-09-12 LAB — RESPIRATORY PANEL BY RT PCR (FLU A&B, COVID)
Influenza A by PCR: NEGATIVE
Influenza B by PCR: NEGATIVE
SARS Coronavirus 2 by RT PCR: POSITIVE — AB

## 2020-09-12 NOTE — ED Triage Notes (Signed)
Nausea and vomiting with abdominal pain. Also c/o fever intermittent

## 2020-09-16 DIAGNOSIS — Z03818 Encounter for observation for suspected exposure to other biological agents ruled out: Secondary | ICD-10-CM | POA: Diagnosis not present

## 2020-09-27 ENCOUNTER — Other Ambulatory Visit: Payer: Self-pay

## 2020-09-27 ENCOUNTER — Other Ambulatory Visit: Payer: Medicaid Other

## 2020-09-27 DIAGNOSIS — Z20822 Contact with and (suspected) exposure to covid-19: Secondary | ICD-10-CM | POA: Diagnosis not present

## 2020-09-28 LAB — NOVEL CORONAVIRUS, NAA: SARS-CoV-2, NAA: NOT DETECTED

## 2020-09-28 LAB — SARS-COV-2, NAA 2 DAY TAT

## 2020-09-30 DIAGNOSIS — Z03818 Encounter for observation for suspected exposure to other biological agents ruled out: Secondary | ICD-10-CM | POA: Diagnosis not present

## 2020-10-05 ENCOUNTER — Ambulatory Visit (INDEPENDENT_AMBULATORY_CARE_PROVIDER_SITE_OTHER): Payer: Self-pay | Admitting: *Deleted

## 2020-10-05 ENCOUNTER — Other Ambulatory Visit: Payer: Self-pay | Admitting: Obstetrics & Gynecology

## 2020-10-05 ENCOUNTER — Other Ambulatory Visit: Payer: Self-pay

## 2020-10-05 ENCOUNTER — Other Ambulatory Visit: Payer: Self-pay | Admitting: Advanced Practice Midwife

## 2020-10-05 VITALS — BP 108/61 | HR 72

## 2020-10-05 DIAGNOSIS — O3680X0 Pregnancy with inconclusive fetal viability, not applicable or unspecified: Secondary | ICD-10-CM

## 2020-10-05 DIAGNOSIS — N926 Irregular menstruation, unspecified: Secondary | ICD-10-CM

## 2020-10-05 DIAGNOSIS — Z3201 Encounter for pregnancy test, result positive: Secondary | ICD-10-CM

## 2020-10-05 LAB — POCT URINE PREGNANCY: Preg Test, Ur: POSITIVE — AB

## 2020-10-05 MED ORDER — PRENATAL VITAMINS 28-0.8 MG PO TABS: 1.0000 | ORAL_TABLET | Freq: Every day | ORAL | 11 refills | Status: DC

## 2020-10-05 NOTE — Progress Notes (Addendum)
   NURSE VISIT- PREGNANCY CONFIRMATION   SUBJECTIVE:  Monica Vazquez is a 24 y.o. G51P0010 female at [redacted]w[redacted]d by uncertain LMP of Patient's last menstrual period was 07/17/2020 (within weeks). Here for pregnancy confirmation.  Home pregnancy test: positive x 3  She reports nausea.  She is taking prenatal vitamins.    OBJECTIVE:  BP 108/61 (BP Location: Left Arm, Patient Position: Sitting, Cuff Size: Normal)   Pulse 72   LMP 07/17/2020 (Within Weeks)   Appears well, in no apparent distress OB History  Gravida Para Term Preterm AB Living  2 0 0 0 1 0  SAB TAB Ectopic Multiple Live Births  0 0 0 0      # Outcome Date GA Lbr Len/2nd Weight Sex Delivery Anes PTL Lv  2 Current           1 AB             Results for orders placed or performed in visit on 10/05/20 (from the past 24 hour(s))  POCT urine pregnancy   Collection Time: 10/05/20 10:53 AM  Result Value Ref Range   Preg Test, Ur Positive (A) Negative    ASSESSMENT: Positive pregnancy test, [redacted]w[redacted]d by LMP    PLAN: Schedule for dating ultrasound in next available  Prenatal vitamins: note routed to Philipp Deputy to send prescription   Nausea medicines: not currently needed   OB packet given: Yes  Annamarie Dawley  10/05/2020 10:54 AM   Chart reviewed for nurse visit. Agree with plan of care.  Arabella Merles, CNM 10/05/2020 4:54 PM

## 2020-10-05 NOTE — Progress Notes (Deleted)
pre

## 2020-10-06 ENCOUNTER — Ambulatory Visit: Payer: Medicaid Other

## 2020-10-06 ENCOUNTER — Ambulatory Visit (INDEPENDENT_AMBULATORY_CARE_PROVIDER_SITE_OTHER): Payer: Medicaid Other

## 2020-10-06 DIAGNOSIS — Z3A11 11 weeks gestation of pregnancy: Secondary | ICD-10-CM | POA: Diagnosis not present

## 2020-10-06 DIAGNOSIS — O3680X0 Pregnancy with inconclusive fetal viability, not applicable or unspecified: Secondary | ICD-10-CM | POA: Diagnosis not present

## 2020-10-06 NOTE — Progress Notes (Signed)
[name] 10 wks,single IUP,fhr 166 bpm,CRL 31.45 mm,normal right ovary,left ovary not visualized

## 2020-10-12 ENCOUNTER — Other Ambulatory Visit: Payer: Self-pay

## 2020-10-12 ENCOUNTER — Inpatient Hospital Stay (HOSPITAL_COMMUNITY)
Admission: AD | Admit: 2020-10-12 | Discharge: 2020-10-12 | Disposition: A | Discharge status: Home / Self Care | Payer: Medicaid Other | Attending: Obstetrics and Gynecology | Admitting: Obstetrics and Gynecology

## 2020-10-12 ENCOUNTER — Encounter (HOSPITAL_COMMUNITY): Payer: Self-pay | Admitting: Obstetrics and Gynecology

## 2020-10-12 DIAGNOSIS — O210 Mild hyperemesis gravidarum: Secondary | ICD-10-CM | POA: Insufficient documentation

## 2020-10-12 DIAGNOSIS — F17210 Nicotine dependence, cigarettes, uncomplicated: Secondary | ICD-10-CM | POA: Insufficient documentation

## 2020-10-12 DIAGNOSIS — Z3A10 10 weeks gestation of pregnancy: Secondary | ICD-10-CM | POA: Insufficient documentation

## 2020-10-12 DIAGNOSIS — O219 Vomiting of pregnancy, unspecified: Secondary | ICD-10-CM

## 2020-10-12 DIAGNOSIS — O99331 Smoking (tobacco) complicating pregnancy, first trimester: Secondary | ICD-10-CM | POA: Insufficient documentation

## 2020-10-12 LAB — URINALYSIS, ROUTINE W REFLEX MICROSCOPIC
Bilirubin Urine: NEGATIVE
Glucose, UA: NEGATIVE mg/dL
Hgb urine dipstick: NEGATIVE
Ketones, ur: 80 mg/dL — AB
Nitrite: NEGATIVE
Protein, ur: 100 mg/dL — AB
Specific Gravity, Urine: 1.03 (ref 1.005–1.030)
pH: 5 (ref 5.0–8.0)

## 2020-10-12 LAB — CBC
HCT: 42.8 % (ref 36.0–46.0)
Hemoglobin: 14.1 g/dL (ref 12.0–15.0)
MCH: 28 pg (ref 26.0–34.0)
MCHC: 32.9 g/dL (ref 30.0–36.0)
MCV: 85.1 fL (ref 80.0–100.0)
Platelets: 213 10*3/uL (ref 150–400)
RBC: 5.03 MIL/uL (ref 3.87–5.11)
RDW: 13.3 % (ref 11.5–15.5)
WBC: 13.1 10*3/uL — ABNORMAL HIGH (ref 4.0–10.5)
nRBC: 0 % (ref 0.0–0.2)

## 2020-10-12 LAB — COMPREHENSIVE METABOLIC PANEL
ALT: 12 U/L (ref 0–44)
AST: 15 U/L (ref 15–41)
Albumin: 3.6 g/dL (ref 3.5–5.0)
Alkaline Phosphatase: 40 U/L (ref 38–126)
Anion gap: 9 (ref 5–15)
BUN: 9 mg/dL (ref 6–20)
CO2: 21 mmol/L — ABNORMAL LOW (ref 22–32)
Calcium: 9.1 mg/dL (ref 8.9–10.3)
Chloride: 104 mmol/L (ref 98–111)
Creatinine, Ser: 0.6 mg/dL (ref 0.44–1.00)
GFR, Estimated: >60 mL/min (ref >60)
Glucose, Bld: 112 mg/dL — ABNORMAL HIGH (ref 70–99)
Potassium: 3.5 mmol/L (ref 3.5–5.1)
Sodium: 134 mmol/L — ABNORMAL LOW (ref 135–145)
Total Bilirubin: 0.5 mg/dL (ref 0.3–1.2)
Total Protein: 6.7 g/dL (ref 6.5–8.1)

## 2020-10-12 MED ORDER — LACTATED RINGERS IV SOLN: INTRAVENOUS | Status: DC

## 2020-10-12 MED ORDER — FAMOTIDINE IN NACL 20-0.9 MG/50ML-% IV SOLN
20.0000 mg | Freq: Once | INTRAVENOUS | Status: AC
  Administered 2020-10-12: 20 mg via INTRAVENOUS
  Filled 2020-10-12: qty 50

## 2020-10-12 MED ORDER — FAMOTIDINE 20 MG PO TABS: 20.0000 mg | ORAL_TABLET | Freq: Every day | ORAL | 1 refills | Status: DC

## 2020-10-12 MED ORDER — ONDANSETRON 4 MG PO TBDP
4.0000 mg | ORAL_TABLET | Freq: Once | ORAL | Status: AC
  Administered 2020-10-12: 4 mg via ORAL
  Filled 2020-10-12: qty 1

## 2020-10-12 MED ORDER — ONDANSETRON 4 MG PO TBDP: 4.0000 mg | ORAL_TABLET | Freq: Three times a day (TID) | ORAL | 2 refills | Status: DC | PRN

## 2020-10-12 MED ORDER — LACTATED RINGERS IV BOLUS
1000.0000 mL | Freq: Once | INTRAVENOUS | Status: AC
  Administered 2020-10-12: 1000 mL via INTRAVENOUS

## 2020-10-12 NOTE — MAU Note (Signed)
.   Monica Vazquez is a 24 y.o. at [redacted]w[redacted]d here in MAU reporting: she has not been able to keep anything down for 3 days. Lower abdominal on and off when she Is vomiting. Denies any VB or abnormal discharge LMP: 07/17/2020 Onset of complaint: 3 days Pain score: 4 Vitals:   10/12/20 1047  BP: 112/80  Pulse: (!) 113  Resp: 16  Temp: 97.8 F (36.6 C)  SpO2: 100%   FHT:165 Lab orders placed from triage: UA

## 2020-10-12 NOTE — Discharge Instructions (Signed)
Morning Sickness  Morning sickness is when a woman feels nauseous during pregnancy. This nauseous feeling may or may not come with vomiting. It often occurs in the morning, but it can be a problem at any time of day. Morning sickness is most common during the first trimester. In some cases, it may continue throughout pregnancy. Although morning sickness is unpleasant, it is usually harmless unless the woman develops severe and continual vomiting (hyperemesis gravidarum), a condition that requires more intense treatment. What are the causes? The exact cause of this condition is not known, but it seems to be related to normal hormonal changes that occur in pregnancy. What increases the risk? You are more likely to develop this condition if:  You experienced nausea or vomiting before your pregnancy.  You had morning sickness during a previous pregnancy.  You are pregnant with more than one baby, such as twins. What are the signs or symptoms? Symptoms of this condition include:  Nausea.  Vomiting. How is this diagnosed? This condition is usually diagnosed based on your signs and symptoms. How is this treated? In many cases, treatment is not needed for this condition. Making some changes to what you eat may help to control symptoms. Your health care provider may also prescribe or recommend:  Vitamin B6 supplements.  Anti-nausea medicines.  Ginger. Follow these instructions at home: Medicines  Take over-the-counter and prescription medicines only as told by your health care provider. Do not use any prescription, over-the-counter, or herbal medicines for morning sickness without first talking with your health care provider.  Taking multivitamins before getting pregnant can prevent or decrease the severity of morning sickness in most women. Eating and drinking  Eat a piece of dry toast or crackers before getting out of bed in the morning.  Eat 5 or 6 small meals a day.  Eat dry and  bland foods, such as rice or a baked potato. Foods that are high in carbohydrates are often helpful.  Avoid greasy, fatty, and spicy foods.  Have someone cook for you if the smell of any food causes nausea and vomiting.  If you feel nauseous after taking prenatal vitamins, take the vitamins at night or with a snack.  Snack on protein foods between meals if you are hungry. Nuts, yogurt, and cheese are good options.  Drink fluids throughout the day.  Try ginger ale made with real ginger, ginger tea made from fresh grated ginger, or ginger candies. General instructions  Do not use any products that contain nicotine or tobacco, such as cigarettes and e-cigarettes. If you need help quitting, ask your health care provider.  Get an air purifier to keep the air in your house free of odors.  Get plenty of fresh air.  Try to avoid odors that trigger your nausea.  Consider trying these methods to help relieve symptoms: ? Wearing an acupressure wristband. These wristbands are often worn for seasickness. ? Acupuncture. Contact a health care provider if:  Your home remedies are not working and you need medicine.  You feel dizzy or light-headed.  You are losing weight. Get help right away if:  You have persistent and uncontrolled nausea and vomiting.  You faint.  You have severe pain in your abdomen. Summary  Morning sickness is when a woman feels nauseous during pregnancy. This nauseous feeling may or may not come with vomiting.  Morning sickness is most common during the first trimester.  It often occurs in the morning, but it can be a problem at   any time of day.  In many cases, treatment is not needed for this condition. Making some changes to what you eat may help to control symptoms. This information is not intended to replace advice given to you by your health care provider. Make sure you discuss any questions you have with your health care provider. Document Revised:  11/01/2017 Document Reviewed: 12/22/2016 Elsevier Patient Education  2020 [employer]   ____________________________________________________________________________   Hyperemesis Gravidarum Hyperemesis gravidarum is a severe form of nausea and vomiting that happens during pregnancy. Hyperemesis is worse than morning sickness. It may cause you to have nausea or vomiting all day for many days. It may keep you from eating and drinking enough food and liquids, which can lead to dehydration, malnutrition, and weight loss. Hyperemesis usually occurs during the first half (the first 20 weeks) of pregnancy. It often goes away once a woman is in her second half of pregnancy. However, sometimes hyperemesis continues through an entire pregnancy. What are the causes? The cause of this condition is not known. It may be related to changes in chemicals (hormones) in the body during pregnancy, such as the high level of pregnancy hormone (human chorionic gonadotropin) or the increase in the female sex hormone (estrogen). What are the signs or symptoms? Symptoms of this condition include:  Nausea that does not go away.  Vomiting that does not allow you to keep any food down.  Weight loss.  Body fluid loss (dehydration).  Having no desire to eat, or not liking food that you have previously enjoyed. How is this diagnosed? This condition may be diagnosed based on:  A physical exam.  Your medical history.  Your symptoms.  Blood tests.  Urine tests. How is this treated? This condition is managed by controlling symptoms. This may include:  Following an eating plan. This can help lessen nausea and vomiting.  Taking prescription medicines. An eating plan and medicines are often used together to help control symptoms. If medicines do not help relieve nausea and vomiting, you may need to receive fluids through an IV at the hospital. Follow these instructions at home: Eating and drinking   Avoid  the following: ? Drinking fluids with meals. Try not to drink anything during the 30 minutes before and after your meals. ? Drinking more than 1 cup of fluid at a time. ? Eating foods that trigger your symptoms. These may include spicy foods, coffee, high-fat foods, very sweet foods, and acidic foods. ? Skipping meals. Nausea can be more intense on an empty stomach. If you cannot tolerate food, do not force it. Try sucking on ice chips or other frozen items and make up for missed calories later. ? Lying down within 2 hours after eating. ? Being exposed to environmental triggers. These may include food smells, smoky rooms, closed spaces, rooms with strong smells, warm or humid places, overly loud and noisy rooms, and rooms with motion or flickering lights. Try eating meals in a well-ventilated area that is free of strong smells. ? Quick and sudden changes in your movement. ? Taking iron pills and multivitamins that contain iron. If you take prescription iron pills, do not stop taking them unless your health care provider approves. ? Preparing food. The smell of food can spoil your appetite or trigger nausea.  To help relieve your symptoms: ? Listen to your body. Everyone is different and has different preferences. Find what works best for you. ? Eat and drink slowly. ? Eat 5-6 small meals daily instead   of 3 large meals. Eating small meals and snacks can help you avoid an empty stomach. ? In the morning, before getting out of bed, eat a couple of crackers to avoid moving around on an empty stomach. ? Try eating starchy foods as these are usually tolerated well. Examples include cereal, toast, bread, potatoes, pasta, rice, and pretzels. ? Include at least 1 serving of protein with your meals and snacks. Protein options include lean meats, poultry, seafood, beans, nuts, nut butters, eggs, cheese, and yogurt. ? Try eating a protein-rich snack before bed. Examples of a protein-rick snack include cheese  and crackers or a peanut butter sandwich made with 1 slice of whole-wheat bread and 1 tsp (5 g) of peanut butter. ? Eat or suck on things that have ginger in them. It may help relieve nausea. Add  tsp ground ginger to hot tea or choose ginger tea. ? Try drinking 100% fruit juice or an electrolyte drink. An electrolyte drink contains sodium, potassium, and chloride. ? Drink fluids that are cold, clear, and carbonated or sour. Examples include lemonade, ginger ale, lemon-lime soda, ice water, and sparkling water. ? Brush your teeth or use a mouth rinse after meals. ? Talk with your health care provider about starting a supplement of vitamin B6. General instructions  Take over-the-counter and prescription medicines only as told by your health care provider.  Follow instructions from your health care provider about eating or drinking restrictions.  Continue to take your prenatal vitamins as told by your health care provider. If you are having trouble taking your prenatal vitamins, talk with your health care provider about different options.  Keep all follow-up and pre-birth (prenatal) visits as told by your health care provider. This is important. Contact a health care provider if:  You have pain in your abdomen.  You have a severe headache.  You have vision problems.  You are losing weight.  You feel weak or dizzy. Get help right away if:  You cannot drink fluids without vomiting.  You vomit blood.  You have constant nausea and vomiting.  You are very weak.  You faint.  You have a fever and your symptoms suddenly get worse. Summary  Hyperemesis gravidarum is a severe form of nausea and vomiting that happens during pregnancy.  Making some changes to your eating habits may help relieve nausea and vomiting.  This condition may be managed with medicine.  If medicines do not help relieve nausea and vomiting, you may need to receive fluids through an IV at the hospital. This  information is not intended to replace advice given to you by your health care provider. Make sure you discuss any questions you have with your health care provider. Document Revised: 12/09/2017 Document Reviewed: 07/18/2016 Elsevier Patient Education  2020 [employer]  

## 2020-10-12 NOTE — MAU Provider Note (Signed)
History     CSN: [id number]  Arrival date and time: 10/12/20 1031   First Provider Initiated Contact with Patient 10/12/20 1116      Chief Complaint  Patient presents with  . Abdominal Pain  . Morning Sickness   Monica Vazquez is a 24 y.o. G2P0010 at [redacted]w[redacted]d who presents to MAU for nausea and vomiting. Patient is not actively vomiting while in MAU.  Onset: 3-4 days ago Location: stomach Duration: 3-4 days Character: nausea intermittent, vomiting x 7-8 times per day, is able to drink Sprite, reports is able to keep down food for 15-20 minutes Aggravating/Associated: none/none Relieving: none Treatment: none, pt did not call OB  Pt denies VB, LOF, ctx, decreased FM, vaginal discharge/odor/itching. Pt denies abdominal pain, constipation, diarrhea, or urinary problems. Pt denies fever, chills, fatigue, sweating or changes in appetite. Pt denies SOB or chest pain. Pt denies dizziness, HA, light-headedness, weakness.  Problems this pregnancy include: none. Allergies? hydrocodone Current medications/supplements? suboxone [facility], next appt 10/21/2020   OB History    Gravida  2   Para  0   Term  0   Preterm  0   AB  1   Living  0     SAB  0   TAB  0   Ectopic  0   Multiple  0   Live Births              Past Medical History:  Diagnosis Date  . ADHD (attention deficit hyperactivity disorder)   . 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 Claustrophobia     Past Surgical History:  Procedure Laterality Date  . implanon    . TYMPANOMASTOIDECTOMY Right 09/12/2015   Procedure: RIGHT MODIFIED RADICAL TYMPANOMASTOIDECTOMY;  Surgeon: Newman Pies, MD;  Location: [facility];  Service: ENT;  Laterality: Right;  . WISDOM TOOTH EXTRACTION      Family History  Problem Relation Age of Onset  . Hypertension Father   . Heart disease Father   . Renal cancer Father     Social History   Tobacco Use  . Smoking status:  Current Every Day Smoker    Packs/day: 0.00    Years: 3.00    Pack years: 0.00    Types: Cigarettes  . Smokeless tobacco: Never Used  . Tobacco comment: smokes 3-4 cig daily  Substance Use Topics  . Alcohol use: No  . Drug use: Not Currently    Allergies:  Allergies  Allergen Reactions  . Hydrocodone Itching    SKIN BURNING    No medications prior to admission.    Review of Systems  Constitutional: Negative for chills, diaphoresis, fatigue and fever.  Eyes: Negative for visual disturbance.  Respiratory: Negative for shortness of breath.   Cardiovascular: Negative for chest pain.  Gastrointestinal: Positive for nausea and vomiting. Negative for abdominal pain, constipation and diarrhea.  Genitourinary: Negative for dysuria, flank pain, frequency, pelvic pain, urgency, vaginal bleeding and vaginal discharge.  Neurological: Negative for dizziness, weakness, light-headedness and headaches.   Physical Exam   Blood pressure 124/62, pulse 79, temperature 97.8 F (36.6 C), resp. rate 15, height 5\' 7"  (1.702 m), weight 78.5 kg, last menstrual period 07/17/2020, SpO2 100 %.  Patient Vitals for the past 24 hrs:  BP Temp Pulse Resp SpO2 Height Weight  10/12/20 1629 124/62 -- 79 15 100 % -- --  10/12/20 1047 112/80 97.8 F (36.6 C) (!) 113 16 100 % 5\' 7"  (1.702  m) 78.5 kg   Physical Exam Vitals and nursing note reviewed.  Constitutional:      General: She is not in acute distress.    Appearance: Normal appearance. She is not ill-appearing, toxic-appearing or diaphoretic.  HENT:     Head: Normocephalic and atraumatic.  Pulmonary:     Effort: Pulmonary effort is normal.  Neurological:     Mental Status: She is alert and oriented to person, place, and time.  Psychiatric:        Mood and Affect: Mood normal.        Behavior: Behavior normal.        Thought Content: Thought content normal.        Judgment: Judgment normal.    Results for orders placed or performed during the  hospital encounter of 10/12/20 (from the past 24 hour(s))  Urinalysis, Routine w reflex microscopic Urine, Clean Catch     Status: Abnormal   Collection Time: 10/12/20 11:02 AM  Result Value Ref Range   Color, Urine AMBER (A) YELLOW   APPearance CLOUDY (A) CLEAR   Specific Gravity, Urine 1.030 1.005 - 1.030   pH 5.0 5.0 - 8.0   Glucose, UA NEGATIVE NEGATIVE mg/dL   Hgb urine dipstick NEGATIVE NEGATIVE   Bilirubin Urine NEGATIVE NEGATIVE   Ketones, ur 80 (A) NEGATIVE mg/dL   Protein, ur 419 (A) NEGATIVE mg/dL   Nitrite NEGATIVE NEGATIVE   Leukocytes,Ua TRACE (A) NEGATIVE   RBC / HPF 0-5 0 - 5 RBC/hpf   WBC, UA 0-5 0 - 5 WBC/hpf   Bacteria, UA FEW (A) NONE SEEN   Squamous Epithelial / LPF 6-10 0 - 5   Mucus PRESENT    Non Squamous Epithelial 0-5 (A) NONE SEEN  CBC     Status: Abnormal   Collection Time: 10/12/20 12:13 PM  Result Value Ref Range   WBC 13.1 (H) 4.0 - 10.5 K/uL   RBC 5.03 3.87 - 5.11 MIL/uL   Hemoglobin 14.1 12.0 - 15.0 g/dL   HCT 37.9 36 - 46 %   MCV 85.1 80.0 - 100.0 fL   MCH 28.0 26.0 - 34.0 pg   MCHC 32.9 30.0 - 36.0 g/dL   RDW 02.4 09.7 - 35.3 %   Platelets 213 150 - 400 K/uL   nRBC 0.0 0.0 - 0.2 %  Comprehensive metabolic panel     Status: Abnormal   Collection Time: 10/12/20 12:13 PM  Result Value Ref Range   Sodium 134 (L) 135 - 145 mmol/L   Potassium 3.5 3.5 - 5.1 mmol/L   Chloride 104 98 - 111 mmol/L   CO2 21 (L) 22 - 32 mmol/L   Glucose, Bld 112 (H) 70 - 99 mg/dL   BUN 9 6 - 20 mg/dL   Creatinine, Ser 2.99 0.44 - 1.00 mg/dL   Calcium 9.1 8.9 - 24.2 mg/dL   Total Protein 6.7 6.5 - 8.1 g/dL   Albumin 3.6 3.5 - 5.0 g/dL   AST 15 15 - 41 U/L   ALT 12 0 - 44 U/L   Alkaline Phosphatase 40 38 - 126 U/L   Total Bilirubin 0.5 0.3 - 1.2 mg/dL   GFR, Estimated >68 >34 mL/min   Anion gap 9 5 - 15   No results found.  MAU Course  Procedures  MDM -N/V in pregnancy -weight today 78.5kg -UA: amber/cloudy/80ketones/100PRO/trace leuks/few bacteria,  sending urine for culture -CBC: WNL -CMP: WNL -consulted with Dr. Adrian Blackwater re: suboxone and QT prolongation with anti-emetics typically  used in pregnancy. Suggests getting EKG prior to administration of medication and if normal, can use Zofran and send home with RX for Zofran -EKG: WNL (consulted with cardiology) -1L LR + 20mg  Pepcid + 4mg  Zofran ODT given, pt reports NV now resolved -pt able to urinate after fluids -PO challenge successful -pt discharged to home in stable condition  Orders Placed This Encounter  Procedures  . Culture, OB Urine    Standing Status:   Standing    Number of Occurrences:   1  . Urinalysis, Routine w reflex microscopic Urine, Clean Catch    Standing Status:   Standing    Number of Occurrences:   1  . CBC    Standing Status:   Standing    Number of Occurrences:   1  . Comprehensive metabolic panel    Standing Status:   Standing    Number of Occurrences:   1  . EKG 12-Lead    Standing Status:   Standing    Number of Occurrences:   1    Order Specific Question:   Reason for Exam    Answer:   medication  . Insert peripheral IV    Standing Status:   Standing    Number of Occurrences:   1  . Discharge patient    Order Specific Question:   Discharge disposition    Answer:   01-Home or Self Care [1]    Order Specific Question:   Discharge patient date    Answer:   10/12/2020   Meds ordered this encounter  Medications  . lactated ringers bolus 1,000 mL  . famotidine (PEPCID) IVPB 20 mg premix  . lactated ringers infusion  . ondansetron (ZOFRAN-ODT) disintegrating tablet 4 mg  . famotidine (PEPCID) 20 MG tablet    Sig: Take 1 tablet (20 mg total) by mouth daily.    Dispense:  30 tablet    Refill:  1    Order Specific Question:   Supervising Provider    Answer:   ERVIN, MICHAEL L [[id number]]  . ondansetron (ZOFRAN ODT) 4 MG disintegrating tablet    Sig: Take 1 tablet (4 mg total) by mouth every 8 (eight) hours as needed for nausea or vomiting.     Dispense:  20 tablet    Refill:  2    Order Specific Question:   Supervising Provider    Answer:   Alysia PennaERVIN, MICHAEL L [[id number]]    Assessment and Plan   1. Nausea and vomiting in pregnancy     Allergies as of 10/12/2020      Reactions   Hydrocodone Itching   SKIN BURNING      Medication List    TAKE these medications   amphetamine-dextroamphetamine 30 MG 24 hr capsule Commonly known as: ADDERALL XR Take 30 mg by mouth at bedtime.   buprenorphine-naloxone 8-2 mg Subl SL tablet Commonly known as: SUBOXONE Place 1 tablet under the tongue daily. 8mg  in the morning   famotidine 20 MG tablet Commonly known as: Pepcid Take 1 tablet (20 mg total) by mouth daily.   ondansetron 4 MG disintegrating tablet Commonly known as: Zofran ODT Take 1 tablet (4 mg total) by mouth every 8 (eight) hours as needed for nausea or vomiting.   Prenatal Vitamins 28-0.8 MG Tabs Take 1 tablet by mouth daily.       -will call with culture results, if positive -pt advised to take medications around the clock and not to stop taking if feeling better -discussed  nonpharmacologic and pharmacologic treatments of N/V -discussed normal expectations for N/V in pregnancy -pt discharged to home in stable condition  Monica Vazquez 10/12/2020, 4:50 PM

## 2020-10-14 ENCOUNTER — Other Ambulatory Visit: Payer: Self-pay | Admitting: Women's Health

## 2020-10-14 DIAGNOSIS — Z03818 Encounter for observation for suspected exposure to other biological agents ruled out: Secondary | ICD-10-CM | POA: Diagnosis not present

## 2020-10-14 LAB — CULTURE, OB URINE: Culture: >=100000 — AB

## 2020-10-14 MED ORDER — CEFADROXIL 500 MG PO CAPS: 500.0000 mg | ORAL_CAPSULE | Freq: Two times a day (BID) | ORAL | 0 refills | Status: AC

## 2020-10-14 NOTE — Progress Notes (Signed)
RX Duricef for UTI.  Marylen Ponto, NP  11:42 AM 10/14/2020

## 2020-10-18 DIAGNOSIS — Z3490 Encounter for supervision of normal pregnancy, unspecified, unspecified trimester: Secondary | ICD-10-CM | POA: Insufficient documentation

## 2020-10-18 DIAGNOSIS — O0990 Supervision of high risk pregnancy, unspecified, unspecified trimester: Secondary | ICD-10-CM | POA: Insufficient documentation

## 2020-10-19 ENCOUNTER — Other Ambulatory Visit: Payer: Self-pay | Admitting: Obstetrics & Gynecology

## 2020-10-19 DIAGNOSIS — Z3682 Encounter for antenatal screening for nuchal translucency: Secondary | ICD-10-CM

## 2020-10-20 ENCOUNTER — Encounter: Payer: Self-pay | Admitting: Advanced Practice Midwife

## 2020-10-20 DIAGNOSIS — F1120 Opioid dependence, uncomplicated: Secondary | ICD-10-CM | POA: Insufficient documentation

## 2020-10-21 ENCOUNTER — Ambulatory Visit (INDEPENDENT_AMBULATORY_CARE_PROVIDER_SITE_OTHER): Payer: Medicaid Other | Admitting: Advanced Practice Midwife

## 2020-10-21 ENCOUNTER — Ambulatory Visit (INDEPENDENT_AMBULATORY_CARE_PROVIDER_SITE_OTHER): Payer: Medicaid Other

## 2020-10-21 ENCOUNTER — Other Ambulatory Visit: Payer: Self-pay

## 2020-10-21 ENCOUNTER — Encounter: Payer: Self-pay | Admitting: Advanced Practice Midwife

## 2020-10-21 VITALS — BP 104/66 | HR 70 | Wt 179.4 lb

## 2020-10-21 DIAGNOSIS — Z3682 Encounter for antenatal screening for nuchal translucency: Secondary | ICD-10-CM

## 2020-10-21 DIAGNOSIS — Z3481 Encounter for supervision of other normal pregnancy, first trimester: Secondary | ICD-10-CM | POA: Diagnosis not present

## 2020-10-21 DIAGNOSIS — Z3400 Encounter for supervision of normal first pregnancy, unspecified trimester: Secondary | ICD-10-CM

## 2020-10-21 DIAGNOSIS — Z331 Pregnant state, incidental: Secondary | ICD-10-CM

## 2020-10-21 DIAGNOSIS — Z3480 Encounter for supervision of other normal pregnancy, unspecified trimester: Secondary | ICD-10-CM | POA: Diagnosis not present

## 2020-10-21 DIAGNOSIS — Z1389 Encounter for screening for other disorder: Secondary | ICD-10-CM

## 2020-10-21 DIAGNOSIS — F419 Anxiety disorder, unspecified: Secondary | ICD-10-CM | POA: Insufficient documentation

## 2020-10-21 DIAGNOSIS — O2341 Unspecified infection of urinary tract in pregnancy, first trimester: Secondary | ICD-10-CM | POA: Insufficient documentation

## 2020-10-21 DIAGNOSIS — Z3A12 12 weeks gestation of pregnancy: Secondary | ICD-10-CM | POA: Diagnosis not present

## 2020-10-21 DIAGNOSIS — F1120 Opioid dependence, uncomplicated: Secondary | ICD-10-CM

## 2020-10-21 DIAGNOSIS — F17200 Nicotine dependence, unspecified, uncomplicated: Secondary | ICD-10-CM

## 2020-10-21 DIAGNOSIS — O99320 Drug use complicating pregnancy, unspecified trimester: Secondary | ICD-10-CM

## 2020-10-21 LAB — POCT URINALYSIS DIPSTICK OB
Blood, UA: NEGATIVE
Glucose, UA: NEGATIVE
Ketones, UA: NEGATIVE
Leukocytes, UA: NEGATIVE
Nitrite, UA: NEGATIVE
POC,PROTEIN,UA: NEGATIVE

## 2020-10-21 MED ORDER — BLOOD PRESSURE MONITOR MISC: 0 refills | Status: DC

## 2020-10-21 NOTE — Progress Notes (Signed)
   NURSE VISIT- [facility]  SUBJECTIVE:  Monica Vazquez is a 24 y.o. G78P0010 female here for [facility] NIPT and [facility] Screening . She is [redacted]w[redacted]d pregnant.   OBJECTIVE:  Appears well, in no apparent distress  Blood work drawn from right [facility] without difficulty. 1 attempt(s).   ASSESSMENT: Pregnancy [redacted]w[redacted]d Pan[facility] NIPT and [facility] Carrier Screening  PLAN: [facility] portal information given and instructed patient how to access results   Jobe Marker  10/21/2020 12:42 PM

## 2020-10-21 NOTE — Patient Instructions (Addendum)
Monica Vazquez, I greatly value your feedback.  If you receive a survey following your visit with [name] today, we appreciate you taking the time to fill it out.  Thanks, Monica Vazquez CNM  NICU tour/consult with neonatal nurse-practitioner  [phone]  [facility] ([address] [place], [place] [address]) Entrance C, located off of [address] Free 24/7 valet parking   Nausea & Vomiting  Have saltine crackers or pretzels by your bed and eat a few bites before you raise your head out of bed in the morning  Eat small frequent meals throughout the day instead of large meals  Drink plenty of fluids throughout the day to stay hydrated, just don't drink a lot of fluids with your meals.  This can make your stomach fill up faster making you feel sick  Do not brush your teeth right after you eat  Products with real ginger are good for nausea, like ginger ale and ginger hard candy Make sure it says made with real ginger!  Sucking on sour candy like lemon heads is also good for nausea  If your prenatal vitamins make you nauseated, take them at night so you will sleep through the nausea  Sea Bands  If you feel like you need medicine for the nausea & vomiting please let us know  If you are unable to keep any fluids or food down please let us know   Constipation  Drink plenty of fluid, preferably water, throughout the day  Eat foods high in fiber such as fruits, vegetables, and grains  Exercise, such as walking, is a good way to keep your bowels regular  Drink warm fluids, especially warm prune juice, or decaf coffee  Eat a 1/2 cup of real oatmeal (not instant), 1/2 cup applesauce, and 1/2-1 cup warm prune juice every day  If needed, you may take Colace (docusate sodium) stool softener once or twice a day to help keep the stool soft.   If you still are having problems with constipation, you may take Miralax once daily as needed to help keep your bowels  regular.   Home Blood Pressure Monitoring for Patients   Your provider has recommended that you check your blood pressure (BP) at least once a week at home. If you do not have a blood pressure cuff at home, one will be provided for you. Contact your provider if you have not received your monitor within 1 week.   Helpful Tips for Accurate Home Blood Pressure Checks  . Don't smoke, exercise, or drink caffeine 30 minutes before checking your BP . Use the restroom before checking your BP (a full bladder can raise your pressure) . Relax in a comfortable upright chair . Feet on the ground . Left arm resting comfortably on a flat surface at the level of your heart . Legs uncrossed . Back supported . Sit quietly and don't talk . Place the cuff on your bare arm . Adjust snuggly, so that only two fingertips can fit between your skin and the top of the cuff . Check 2 readings separated by at least one minute . Keep a log of your BP readings . For a visual, please reference this diagram: http://ccnc.care/bpdiagram  Provider Name: [facility]     Phone: [phone]  Zone 1: ALL CLEAR  Continue to monitor your symptoms:  . BP reading is less than 140 (top number) or less than 90 (bottom number)  . No right upper  stomach pain . No headaches or seeing spots . No feeling nauseated or throwing up . No swelling in face and hands  Zone 2: CAUTION Call your doctor's office for any of the following:  . BP reading is greater than 140 (top number) or greater than 90 (bottom number)  . Stomach pain under your ribs in the middle or right side . Headaches or seeing spots . Feeling nauseated or throwing up . Swelling in face and hands  Zone 3: EMERGENCY  Seek immediate medical care if you have any of the following:  . BP reading is greater than160 (top number) or greater than 110 (bottom number) . Severe headaches not improving with Tylenol . Serious difficulty catching your breath . Any  worsening symptoms from Zone 2    First Trimester of Pregnancy The first trimester of pregnancy is from week 1 until the end of week 12 (months 1 through 3). A week after a sperm fertilizes an egg, the egg will implant on the wall of the uterus. This embryo will begin to develop into a baby. Genes from you and your partner are forming the baby. The female genes determine whether the baby is a boy or a girl. At 6-8 weeks, the eyes and face are formed, and the heartbeat can be seen on ultrasound. At the end of 12 weeks, all the baby's organs are formed.  Now that you are pregnant, you will want to do everything you can to have a healthy baby. Two of the most important things are to get good prenatal care and to follow your health care provider's instructions. Prenatal care is all the medical care you receive before the baby's birth. This care will help prevent, find, and treat any problems during the pregnancy and childbirth. BODY CHANGES Your body goes through many changes during pregnancy. The changes vary from woman to woman.   You may gain or lose a couple of pounds at first.  You may feel sick to your stomach (nauseous) and throw up (vomit). If the vomiting is uncontrollable, call your health care provider.  You may tire easily.  You may develop headaches that can be relieved by medicines approved by your health care provider.  You may urinate more often. Painful urination may mean you have a bladder infection.  You may develop heartburn as a result of your pregnancy.  You may develop constipation because certain hormones are causing the muscles that push waste through your intestines to slow down.  You may develop hemorrhoids or swollen, bulging veins (varicose veins).  Your breasts may begin to grow larger and become tender. Your nipples may stick out more, and the tissue that surrounds them (areola) may become darker.  Your gums may bleed and may be sensitive to brushing and  flossing.  Dark spots or blotches (chloasma, mask of pregnancy) may develop on your face. This will likely fade after the baby is born.  Your menstrual periods will stop.  You may have a loss of appetite.  You may develop cravings for certain kinds of food.  You may have changes in your emotions from day to day, such as being excited to be pregnant or being concerned that something may go wrong with the pregnancy and baby.  You may have more vivid and strange dreams.  You may have changes in your hair. These can include thickening of your hair, rapid growth, and changes in texture. Some women also have hair loss during or after pregnancy, or hair  that feels dry or thin. Your hair will most likely return to normal after your baby is born. WHAT TO EXPECT AT YOUR PRENATAL VISITS During a routine prenatal visit:  You will be weighed to make sure you and the baby are growing normally.  Your blood pressure will be taken.  Your abdomen will be measured to track your baby's growth.  The fetal heartbeat will be listened to starting around week 10 or 12 of your pregnancy.  Test results from any previous visits will be discussed. Your health care provider may ask you:  How you are feeling.  If you are feeling the baby move.  If you have had any abnormal symptoms, such as leaking fluid, bleeding, severe headaches, or abdominal cramping.  If you have any questions. Other tests that may be performed during your first trimester include:  Blood tests to find your blood type and to check for the presence of any previous infections. They will also be used to check for low iron levels (anemia) and Rh antibodies. Later in the pregnancy, blood tests for diabetes will be done along with other tests if problems develop.  Urine tests to check for infections, diabetes, or protein in the urine.  An ultrasound to confirm the proper growth and development of the baby.  An amniocentesis to check for  possible genetic problems.  Fetal screens for spina bifida and Down syndrome.  You may need other tests to make sure you and the baby are doing well. HOME CARE INSTRUCTIONS  Medicines  Follow your health care provider's instructions regarding medicine use. Specific medicines may be either safe or unsafe to take during pregnancy.  Take your prenatal vitamins as directed.  If you develop constipation, try taking a stool softener if your health care provider approves. Diet  Eat regular, well-balanced meals. Choose a variety of foods, such as meat or vegetable-based protein, fish, milk and low-fat dairy products, vegetables, fruits, and whole grain breads and cereals. Your health care provider will help you determine the amount of weight gain that is right for you.  Avoid raw meat and uncooked cheese. These carry germs that can cause birth defects in the baby.  Eating four or five small meals rather than three large meals a day may help relieve nausea and vomiting. If you start to feel nauseous, eating a few soda crackers can be helpful. Drinking liquids between meals instead of during meals also seems to help nausea and vomiting.  If you develop constipation, eat more high-fiber foods, such as fresh vegetables or fruit and whole grains. Drink enough fluids to keep your urine clear or pale yellow. Activity and Exercise  Exercise only as directed by your health care provider. Exercising will help you:  Control your weight.  Stay in shape.  Be prepared for labor and delivery.  Experiencing pain or cramping in the lower abdomen or low back is a good sign that you should stop exercising. Check with your health care provider before continuing normal exercises.  Try to avoid standing for long periods of time. Move your legs often if you must stand in one place for a long time.  Avoid heavy lifting.  Wear low-heeled shoes, and practice good posture.  You may continue to have sex unless  your health care provider directs you otherwise. Relief of Pain or Discomfort  Wear a good support bra for breast tenderness.    Take warm sitz baths to soothe any pain or discomfort caused by hemorrhoids. Use hemorrhoid  cream if your health care provider approves.    Rest with your legs elevated if you have leg cramps or low back pain.  If you develop varicose veins in your legs, wear support hose. Elevate your feet for 15 minutes, 3-4 times a day. Limit salt in your diet. Prenatal Care  Schedule your prenatal visits by the twelfth week of pregnancy. They are usually scheduled monthly at first, then more often in the last 2 months before delivery.  Write down your questions. Take them to your prenatal visits.  Keep all your prenatal visits as directed by your health care provider. Safety  Wear your seat belt at all times when driving.  Make a list of emergency phone numbers, including numbers for family, friends, the hospital, and police and fire departments. General Tips  Ask your health care provider for a referral to a local prenatal education class. Begin classes no later than at the beginning of month 6 of your pregnancy.  Ask for help if you have counseling or nutritional needs during pregnancy. Your health care provider can offer advice or refer you to specialists for help with various needs.  Do not use hot tubs, steam rooms, or saunas.  Do not douche or use tampons or scented sanitary pads.  Do not cross your legs for long periods of time.  Avoid cat litter boxes and soil used by cats. These carry germs that can cause birth defects in the baby and possibly loss of the fetus by miscarriage or stillbirth.  Avoid all smoking, herbs, alcohol, and medicines not prescribed by your health care provider. Chemicals in these affect the formation and growth of the baby.  Schedule a dentist appointment. At home, brush your teeth with a soft toothbrush and be gentle when you  floss. SEEK MEDICAL CARE IF:   You have dizziness.  You have mild pelvic cramps, pelvic pressure, or nagging pain in the abdominal area.  You have persistent nausea, vomiting, or diarrhea.  You have a bad smelling vaginal discharge.  You have pain with urination.  You notice increased swelling in your face, hands, legs, or ankles. SEEK IMMEDIATE MEDICAL CARE IF:   You have a fever.  You are leaking fluid from your vagina.  You have spotting or bleeding from your vagina.  You have severe abdominal cramping or pain.  You have rapid weight gain or loss.  You vomit blood or material that looks like coffee grounds.  You are exposed to [place] measles and have never had them.  You are exposed to fifth disease or chickenpox.  You develop a severe headache.  You have shortness of breath.  You have any kind of trauma, such as from a fall or a car accident. Document Released: 11/13/2001 Document Revised: 04/05/2014 Document Reviewed: 09/29/2013 [facility] Patient Information 2015 [place], [place]. This information is not intended to replace advice given to you by your health care provider. Make sure you discuss any questions you have with your health care provider.

## 2020-10-21 NOTE — Progress Notes (Signed)
INITIAL OBSTETRICAL VISIT Patient name: Monica Vazquez MRN [id number]  Date of birth: [birth year] Chief Complaint:   Initial Prenatal Visit  History of Present Illness:   Monica Vazquez is a 24 y.o. G91P0010 Caucasian female at [redacted]w[redacted]d by [name] at 10 weeks with an Estimated Date of Delivery: 05/04/21 being seen today for her initial obstetrical visit.   Her obstetrical history is significant for G2P0, SAB 2019.   Today she reports feeling pretty well; is buying suboxone off the street.  Depression screen [facility] 2/9 10/21/2020 10/21/2020 06/30/2018  Decreased Interest 1 1 1   Down, Depressed, Hopeless 0 0 0  PHQ - 2 Score 1 1 1   Altered sleeping 1 1 1   Tired, decreased energy 1 1 1   Change in appetite 0 0 0  Feeling bad or failure about yourself  0 0 0  Trouble concentrating 0 0 0  Moving slowly or fidgety/restless 0 0 0  Suicidal thoughts 0 0 0  PHQ-9 Score 3 3 3   Difficult doing work/chores - - Not difficult at all    Patient's last menstrual period was 07/17/2020 (within weeks). Last pap due at next visit.  Review of Systems:   Pertinent items are noted in HPI Denies cramping/contractions, leakage of fluid, vaginal bleeding, abnormal vaginal discharge w/ itching/odor/irritation, headaches, visual changes, shortness of breath, chest pain, abdominal pain, severe nausea/vomiting, or problems with urination or bowel movements unless otherwise stated above.  Pertinent History Reviewed:  Reviewed past medical,surgical, social, obstetrical and family history.  Reviewed problem list, medications and allergies. OB History  Gravida Para Term Preterm AB Living  2 0 0 0 1 0  SAB TAB Ectopic Multiple Live Births  1 0 0 0      # Outcome Date GA Lbr Len/2nd Weight Sex Delivery Anes PTL Lv  2 Current           1 SAB 07/2018           Physical Assessment:   Vitals:   10/21/20 1200  BP: 104/66  Pulse: 70  Weight: 179 lb 6.4 oz (81.4 kg)  Body mass index is 28.1 kg/m.       Physical  Examination:  General appearance - well appearing, and in no distress  Mental status - alert, oriented to person, place, and time  Psych:  She has a normal mood and affect  Skin - warm and dry, normal color, no suspicious lesions noted  Chest - effort normal, all lung fields clear to auscultation bilaterally  Heart - normal rate and regular rhythm  Abdomen - soft, nontender  Extremities:  No swelling or varicosities noted  Pelvic - not indicated  Thin prep pap is not done     TODAY'S NT 12+1 wks,measurements c/w dates,crl 57.81 mm,NB present,NT 1.1 mm,normal ovaries,anterior placenta,fhr 166 bpm  Results for orders placed or performed in visit on 10/21/20 (from the past 24 hour(s))  POC Urinalysis Dipstick OB   Collection Time: 10/21/20 11:59 AM  Result Value Ref Range   Color, UA     Clarity, UA     Glucose, UA Negative Negative   Bilirubin, UA     Ketones, UA neg    Spec Grav, UA     Blood, UA neg    pH, UA     POC,PROTEIN,UA Negative Negative, Trace, Small (1+), Moderate (2+), Large (3+), 4+   Urobilinogen, UA     Nitrite, UA neg    Leukocytes, UA Negative Negative  Appearance     Odor      Assessment & Plan:  1) Low-Risk Pregnancy G2P0010 at 104w1d with an Estimated Date of Delivery: 05/04/21   2) Initial OB visit  3) Hx substance abuse with current suboxone use, getting from the street; Tish will set up a virtual visit with Dr Adrian Blackwater or Shawnie Pons  4) Recent tx for UTI, POC today  5) Anxiety, no meds  6) Smoker  Meds:  Meds ordered this encounter  Medications  . Blood Pressure Monitor MISC    Sig: For regular home bp monitoring during pregnancy    Dispense:  1 each    Refill:  0    Initial labs obtained Continue prenatal vitamins Reviewed n/v relief measures and warning s/s to report Reviewed recommended weight gain based on pre-gravid BMI Encouraged well-balanced diet Genetic & carrier screening discussed: requests Panorama and Horizon 14 , requests  NT/IT Ultrasound discussed; fetal survey: requested CCNC completed> form faxed if has or is planning to apply for medicaid The nature of [facility] [employer] with multiple MDs and other Advanced Practice Providers was explained to patient; also emphasized that fellows, residents, and students are part of our team. Given order for home bp cuff. Check bp weekly, let us know if >140/90.   No indications for ASA therapy or early HgbA1c (per uptodate)    Follow-up: Return in about 4 weeks (around 11/18/2020) for LROB, in person, 2nd IT.   Orders Placed This Encounter  Procedures  . GC/Chlamydia Probe Amp  . Urine Culture  . Integrated 1  . Genetic Screening  . CBC/D/Plt+RPR+Rh+ABO+Rub Ab...  . Pain Management Screening Profile (10S)  . POC Urinalysis Dipstick OB    Arabella Merles [facility] 10/21/2020 5:57 PM

## 2020-10-21 NOTE — Progress Notes (Signed)
[name] 12+1 wks,measurements c/w dates,crl 57.81 mm,NB present,NT 1.1 mm,normal ovaries,anterior placenta,fhr 166 bpm

## 2020-10-22 ENCOUNTER — Encounter: Payer: Self-pay | Admitting: Advanced Practice Midwife

## 2020-10-22 DIAGNOSIS — Z283 Underimmunization status: Secondary | ICD-10-CM | POA: Insufficient documentation

## 2020-10-22 DIAGNOSIS — Z2839 Other underimmunization status: Secondary | ICD-10-CM | POA: Insufficient documentation

## 2020-10-22 LAB — INTEGRATED 1

## 2020-10-24 LAB — CBC/D/PLT+RPR+RH+ABO+RUB AB...
Antibody Screen: NEGATIVE
Basophils Absolute: 0 10*3/uL (ref 0.0–0.2)
Basos: 0 %
EOS (ABSOLUTE): 0.3 10*3/uL (ref 0.0–0.4)
Eos: 3 %
HCV Ab: 0.4 s/co ratio (ref 0.0–0.9)
HIV Screen 4th Generation wRfx: NONREACTIVE
Hematocrit: 37.6 % (ref 34.0–46.6)
Hemoglobin: 12.6 g/dL (ref 11.1–15.9)
Hepatitis B Surface Ag: NEGATIVE
Immature Grans (Abs): 0 10*3/uL (ref 0.0–0.1)
Immature Granulocytes: 0 %
Lymphocytes Absolute: 2 10*3/uL (ref 0.7–3.1)
Lymphs: 19 %
MCH: 28.6 pg (ref 26.6–33.0)
MCHC: 33.5 g/dL (ref 31.5–35.7)
MCV: 86 fL (ref 79–97)
Monocytes Absolute: 0.6 10*3/uL (ref 0.1–0.9)
Monocytes: 5 %
Neutrophils Absolute: 7.9 10*3/uL — ABNORMAL HIGH (ref 1.4–7.0)
Neutrophils: 73 %
Platelets: 178 10*3/uL (ref 150–450)
RBC: 4.4 x10E6/uL (ref 3.77–5.28)
RDW: 13.7 % (ref 11.7–15.4)
RPR Ser Ql: NONREACTIVE
Rh Factor: POSITIVE
Rubella Antibodies, IGG: <0.9 index — ABNORMAL LOW (ref >0.99)
WBC: 11 10*3/uL — ABNORMAL HIGH (ref 3.4–10.8)

## 2020-10-24 LAB — INTEGRATED 1
Crown Rump Length: 57.8 mm
Gest. Age on Collection Date: 12.1 weeks
Maternal Age at EDD: 23.1 yr
Nuchal Translucency (NT): 1.1 mm
Number of Fetuses: 1
PAPP-A Value: 637 ng/mL
Weight: 179 [lb_av]

## 2020-10-24 LAB — HCV INTERPRETATION

## 2020-10-25 DIAGNOSIS — Z3480 Encounter for supervision of other normal pregnancy, unspecified trimester: Secondary | ICD-10-CM | POA: Diagnosis not present

## 2020-10-25 LAB — URINE CULTURE

## 2020-10-25 LAB — GC/CHLAMYDIA PROBE AMP
Chlamydia trachomatis, NAA: NEGATIVE
Neisseria Gonorrhoeae by PCR: NEGATIVE

## 2020-10-26 LAB — PMP SCREEN PROFILE (10S), URINE
Amphetamine Scrn, Ur: NEGATIVE ng/mL
BARBITURATE SCREEN URINE: NEGATIVE ng/mL
BENZODIAZEPINE SCREEN, URINE: NEGATIVE ng/mL
CANNABINOIDS UR QL SCN: POSITIVE ng/mL — AB
Cocaine (Metab) Scrn, Ur: POSITIVE ng/mL — AB
Creatinine(Crt), U: 114.2 mg/dL (ref 20.0–300.0)
Methadone Screen, Urine: NEGATIVE ng/mL
OXYCODONE+OXYMORPHONE UR QL SCN: POSITIVE ng/mL — AB
Opiate Scrn, Ur: NEGATIVE ng/mL
Ph of Urine: 6 (ref 4.5–8.9)
Phencyclidine Qn, Ur: NEGATIVE ng/mL
Propoxyphene Scrn, Ur: NEGATIVE ng/mL

## 2020-10-28 DIAGNOSIS — Z03818 Encounter for observation for suspected exposure to other biological agents ruled out: Secondary | ICD-10-CM | POA: Diagnosis not present

## 2020-10-29 ENCOUNTER — Encounter: Payer: Self-pay | Admitting: Advanced Practice Midwife

## 2020-10-29 DIAGNOSIS — F1290 Cannabis use, unspecified, uncomplicated: Secondary | ICD-10-CM | POA: Insufficient documentation

## 2020-10-29 DIAGNOSIS — F1410 Cocaine abuse, uncomplicated: Secondary | ICD-10-CM | POA: Insufficient documentation

## 2020-11-09 ENCOUNTER — Encounter: Payer: Self-pay | Admitting: Women's Health

## 2020-11-09 DIAGNOSIS — D563 Thalassemia minor: Secondary | ICD-10-CM | POA: Insufficient documentation

## 2020-11-09 DIAGNOSIS — O285 Abnormal chromosomal and genetic finding on antenatal screening of mother: Secondary | ICD-10-CM | POA: Insufficient documentation

## 2020-11-11 ENCOUNTER — Telehealth: Payer: Self-pay | Admitting: Pediatrics

## 2020-11-11 ENCOUNTER — Encounter: Payer: Self-pay | Admitting: Family Medicine

## 2020-11-11 ENCOUNTER — Other Ambulatory Visit: Payer: Self-pay

## 2020-11-11 ENCOUNTER — Telehealth: Payer: Self-pay | Admitting: Obstetrics and Gynecology

## 2020-11-11 ENCOUNTER — Other Ambulatory Visit (HOSPITAL_COMMUNITY)
Admission: RE | Admit: 2020-11-11 | Discharge: 2020-11-11 | Disposition: A | Discharge status: Home / Self Care | Payer: Medicaid Other | Source: Ambulatory Visit | Attending: Family Medicine | Admitting: Family Medicine

## 2020-11-11 ENCOUNTER — Ambulatory Visit (INDEPENDENT_AMBULATORY_CARE_PROVIDER_SITE_OTHER): Payer: Medicaid Other | Admitting: Family Medicine

## 2020-11-11 VITALS — BP 102/61 | HR 91 | Wt 178.5 lb

## 2020-11-11 DIAGNOSIS — F419 Anxiety disorder, unspecified: Secondary | ICD-10-CM

## 2020-11-11 DIAGNOSIS — O99320 Drug use complicating pregnancy, unspecified trimester: Secondary | ICD-10-CM

## 2020-11-11 DIAGNOSIS — Z03818 Encounter for observation for suspected exposure to other biological agents ruled out: Secondary | ICD-10-CM | POA: Diagnosis not present

## 2020-11-11 DIAGNOSIS — O2341 Unspecified infection of urinary tract in pregnancy, first trimester: Secondary | ICD-10-CM

## 2020-11-11 DIAGNOSIS — Z23 Encounter for immunization: Secondary | ICD-10-CM

## 2020-11-11 DIAGNOSIS — O99321 Drug use complicating pregnancy, first trimester: Secondary | ICD-10-CM

## 2020-11-11 DIAGNOSIS — F1120 Opioid dependence, uncomplicated: Secondary | ICD-10-CM

## 2020-11-11 DIAGNOSIS — Z283 Underimmunization status: Secondary | ICD-10-CM

## 2020-11-11 DIAGNOSIS — F1410 Cocaine abuse, uncomplicated: Secondary | ICD-10-CM

## 2020-11-11 DIAGNOSIS — Z3480 Encounter for supervision of other normal pregnancy, unspecified trimester: Secondary | ICD-10-CM

## 2020-11-11 DIAGNOSIS — R87610 Atypical squamous cells of undetermined significance on cytologic smear of cervix (ASC-US): Secondary | ICD-10-CM | POA: Diagnosis not present

## 2020-11-11 DIAGNOSIS — F17200 Nicotine dependence, unspecified, uncomplicated: Secondary | ICD-10-CM

## 2020-11-11 DIAGNOSIS — O09899 Supervision of other high risk pregnancies, unspecified trimester: Secondary | ICD-10-CM

## 2020-11-11 DIAGNOSIS — O99891 Other specified diseases and conditions complicating pregnancy: Secondary | ICD-10-CM

## 2020-11-11 DIAGNOSIS — F1290 Cannabis use, unspecified, uncomplicated: Secondary | ICD-10-CM

## 2020-11-11 MED ORDER — BUPRENORPHINE HCL-NALOXONE HCL 4-1 MG SL FILM: 4.0000 mg | ORAL_FILM | Freq: Two times a day (BID) | SUBLINGUAL | 0 refills | Status: DC

## 2020-11-11 MED ORDER — BUPRENORPHINE HCL-NALOXONE HCL 8-2 MG SL SUBL: 1.0000 | SUBLINGUAL_TABLET | Freq: Every day | SUBLINGUAL | 0 refills | Status: DC

## 2020-11-11 MED ORDER — NALOXONE HCL 4 MG/0.1ML NA LIQD: NASAL | 1 refills | Status: DC

## 2020-11-11 NOTE — Telephone Encounter (Signed)
OB Telephone Note Nurse called stating pt is calling saying narcan was at pharmacy but not the suboxone. It looks like it was printed and not sent electronically  This was sent in to her pharmacy  Cornelia Copa MD Attending [facility] [employer] (Faculty Practice) 11/11/2020 Time: [phone]

## 2020-11-11 NOTE — Addendum Note (Signed)
Addended by: Merian Capron on: 11/11/2020 04:27 PM   Modules accepted: Orders

## 2020-11-11 NOTE — Patient Instructions (Signed)
 Second Trimester of Pregnancy The second trimester is from week 14 through week 27 (months 4 through 6). The second trimester is often a time when you feel your best. Your body has adjusted to being pregnant, and you begin to feel better physically. Usually, morning sickness has lessened or quit completely, you may have more energy, and you may have an increase in appetite. The second trimester is also a time when the fetus is growing rapidly. At the end of the sixth month, the fetus is about 9 inches long and weighs about 1 pounds. You will likely begin to feel the baby move (quickening) between 16 and 20 weeks of pregnancy. Body changes during your second trimester Your body continues to go through many changes during your second trimester. The changes vary from woman to woman.  Your weight will continue to increase. You will notice your lower abdomen bulging out.  You may begin to get stretch marks on your hips, abdomen, and breasts.  You may develop headaches that can be relieved by medicines. The medicines should be approved by your health care provider.  You may urinate more often because the fetus is pressing on your bladder.  You may develop or continue to have heartburn as a result of your pregnancy.  You may develop constipation because certain hormones are causing the muscles that push waste through your intestines to slow down.  You may develop hemorrhoids or swollen, bulging veins (varicose veins).  You may have back pain. This is caused by: ? Weight gain. ? Pregnancy hormones that are relaxing the joints in your pelvis. ? A shift in weight and the muscles that support your balance.  Your breasts will continue to grow and they will continue to become tender.  Your gums may bleed and may be sensitive to brushing and flossing.  Dark spots or blotches (chloasma, mask of pregnancy) may develop on your face. This will likely fade after the baby is born.  A dark line from  your belly button to the pubic area (linea nigra) may appear. This will likely fade after the baby is born.  You may have changes in your hair. These can include thickening of your hair, rapid growth, and changes in texture. Some women also have hair loss during or after pregnancy, or hair that feels dry or thin. Your hair will most likely return to normal after your baby is born. What to expect at prenatal visits During a routine prenatal visit:  You will be weighed to make sure you and the fetus are growing normally.  Your blood pressure will be taken.  Your abdomen will be measured to track your baby's growth.  The fetal heartbeat will be listened to.  Any test results from the previous visit will be discussed. Your health care provider may ask you:  How you are feeling.  If you are feeling the baby move.  If you have had any abnormal symptoms, such as leaking fluid, bleeding, severe headaches, or abdominal cramping.  If you are using any tobacco products, including cigarettes, chewing tobacco, and electronic cigarettes.  If you have any questions. Other tests that may be performed during your second trimester include:  Blood tests that check for: ? Low iron levels (anemia). ? High blood sugar that affects pregnant women (gestational diabetes) between 24 and 28 weeks. ? Rh antibodies. This is to check for a protein on red blood cells (Rh factor).  Urine tests to check for infections, diabetes, or protein in   the urine.  An ultrasound to confirm the proper growth and development of the baby.  An amniocentesis to check for possible genetic problems.  Fetal screens for spina bifida and Down syndrome.  HIV (human immunodeficiency virus) testing. Routine prenatal testing includes screening for HIV, unless you choose not to have this test. Follow these instructions at home: Medicines  Follow your health care provider's instructions regarding medicine use. Specific medicines  may be either safe or unsafe to take during pregnancy.  Take a prenatal vitamin that contains at least 600 micrograms (mcg) of folic acid.  If you develop constipation, try taking a stool softener if your health care provider approves. Eating and drinking   Eat a balanced diet that includes fresh fruits and vegetables, whole grains, good sources of protein such as meat, eggs, or tofu, and low-fat dairy. Your health care provider will help you determine the amount of weight gain that is right for you.  Avoid raw meat and uncooked cheese. These carry germs that can cause birth defects in the baby.  If you have low calcium intake from food, talk to your health care provider about whether you should take a daily calcium supplement.  Limit foods that are high in fat and processed sugars, such as fried and sweet foods.  To prevent constipation: ? Drink enough fluid to keep your urine clear or pale yellow. ? Eat foods that are high in fiber, such as fresh fruits and vegetables, whole grains, and beans. Activity  Exercise only as directed by your health care provider. Most women can continue their usual exercise routine during pregnancy. Try to exercise for 30 minutes at least 5 days a week. Stop exercising if you experience uterine contractions.  Avoid heavy lifting, wear low heel shoes, and practice good posture.  A sexual relationship may be continued unless your health care provider directs you otherwise. Relieving pain and discomfort  Wear a good support bra to prevent discomfort from breast tenderness.  Take warm sitz baths to soothe any pain or discomfort caused by hemorrhoids. Use hemorrhoid cream if your health care provider approves.  Rest with your legs elevated if you have leg cramps or low back pain.  If you develop varicose veins, wear support hose. Elevate your feet for 15 minutes, 3-4 times a day. Limit salt in your diet. Prenatal Care  Write down your questions. Take  them to your prenatal visits.  Keep all your prenatal visits as told by your health care provider. This is important. Safety  Wear your seat belt at all times when driving.  Make a list of emergency phone numbers, including numbers for family, friends, the hospital, and police and fire departments. General instructions  Ask your health care provider for a referral to a local prenatal education class. Begin classes no later than the beginning of month 6 of your pregnancy.  Ask for help if you have counseling or nutritional needs during pregnancy. Your health care provider can offer advice or refer you to specialists for help with various needs.  Do not use hot tubs, steam rooms, or saunas.  Do not douche or use tampons or scented sanitary pads.  Do not cross your legs for long periods of time.  Avoid cat litter boxes and soil used by cats. These carry germs that can cause birth defects in the baby and possibly loss of the fetus by miscarriage or stillbirth.  Avoid all smoking, herbs, alcohol, and unprescribed drugs. Chemicals in these products can affect the   formation and growth of the baby.  Do not use any products that contain nicotine or tobacco, such as cigarettes and e-cigarettes. If you need help quitting, ask your health care provider.  Visit your dentist if you have not gone yet during your pregnancy. Use a soft toothbrush to brush your teeth and be gentle when you floss. Contact a health care provider if:  You have dizziness.  You have mild pelvic cramps, pelvic pressure, or nagging pain in the abdominal area.  You have persistent nausea, vomiting, or diarrhea.  You have a bad smelling vaginal discharge.  You have pain when you urinate. Get help right away if:  You have a fever.  You are leaking fluid from your vagina.  You have spotting or bleeding from your vagina.  You have severe abdominal cramping or pain.  You have rapid weight gain or weight loss.  You  have shortness of breath with chest pain.  You notice sudden or extreme swelling of your face, hands, ankles, feet, or legs.  You have not felt your baby move in over an hour.  You have severe headaches that do not go away when you take medicine.  You have vision changes. Summary  The second trimester is from week 14 through week 27 (months 4 through 6). It is also a time when the fetus is growing rapidly.  Your body goes through many changes during pregnancy. The changes vary from woman to woman.  Avoid all smoking, herbs, alcohol, and unprescribed drugs. These chemicals affect the formation and growth your baby.  Do not use any tobacco products, such as cigarettes, chewing tobacco, and e-cigarettes. If you need help quitting, ask your health care provider.  Contact your health care provider if you have any questions. Keep all prenatal visits as told by your health care provider. This is important. This information is not intended to replace advice given to you by your health care provider. Make sure you discuss any questions you have with your health care provider. Document Revised: 03/13/2019 Document Reviewed: 12/25/2016 [employer] Patient Education  2020 [employer]   Contraception Choices Contraception, also called birth control, refers to methods or devices that prevent pregnancy. Hormonal methods Contraceptive implant  A contraceptive implant is a thin, plastic tube that contains a hormone. It is inserted into the upper part of the arm. It can remain in place for up to 3 years. Progestin-only injections Progestin-only injections are injections of progestin, a synthetic form of the hormone progesterone. They are given every 3 months by a health care provider. Birth control pills  Birth control pills are pills that contain hormones that prevent pregnancy. They must be taken once a day, preferably at the same time each day. Birth control patch  The birth control patch  contains hormones that prevent pregnancy. It is placed on the skin and must be changed once a week for three weeks and removed on the fourth week. A prescription is needed to use this method of contraception. Vaginal ring  A vaginal ring contains hormones that prevent pregnancy. It is placed in the vagina for three weeks and removed on the fourth week. After that, the process is repeated with a new ring. A prescription is needed to use this method of contraception. Emergency contraceptive Emergency contraceptives prevent pregnancy after unprotected sex. They come in pill form and can be taken up to 5 days after sex. They work best the sooner they are taken after having sex. Most emergency contraceptives are available   without a prescription. This method should not be used as your only form of birth control. Barrier methods Female condom  A female condom is a thin sheath that is worn over the penis during sex. Condoms keep sperm from going inside a woman's body. They can be used with a spermicide to increase their effectiveness. They should be disposed after a single use. Female condom  A female condom is a soft, loose-fitting sheath that is put into the vagina before sex. The condom keeps sperm from going inside a woman's body. They should be disposed after a single use. Diaphragm  A diaphragm is a soft, dome-shaped barrier. It is inserted into the vagina before sex, along with a spermicide. The diaphragm blocks sperm from entering the uterus, and the spermicide kills sperm. A diaphragm should be left in the vagina for 6-8 hours after sex and removed within 24 hours. A diaphragm is prescribed and fitted by a health care provider. A diaphragm should be replaced every 1-2 years, after giving birth, after gaining more than 15 lb (6.8 kg), and after pelvic surgery. Cervical cap  A cervical cap is a round, soft latex or plastic cup that fits over the cervix. It is inserted into the vagina before sex, along  with spermicide. It blocks sperm from entering the uterus. The cap should be left in place for 6-8 hours after sex and removed within 48 hours. A cervical cap must be prescribed and fitted by a health care provider. It should be replaced every 2 years. Sponge  A sponge is a soft, circular piece of polyurethane foam with spermicide on it. The sponge helps block sperm from entering the uterus, and the spermicide kills sperm. To use it, you make it wet and then insert it into the vagina. It should be inserted before sex, left in for at least 6 hours after sex, and removed and thrown away within 30 hours. Spermicides Spermicides are chemicals that kill or block sperm from entering the cervix and uterus. They can come as a cream, jelly, suppository, foam, or tablet. A spermicide should be inserted into the vagina with an applicator at least 10-15 minutes before sex to allow time for it to work. The process must be repeated every time you have sex. Spermicides do not require a prescription. Intrauterine contraception Intrauterine device (IUD) An IUD is a T-shaped device that is put in a woman's uterus. There are two types:  Hormone IUD.This type contains progestin, a synthetic form of the hormone progesterone. This type can stay in place for 3-5 years.  Copper IUD.This type is wrapped in copper wire. It can stay in place for 10 years.  Permanent methods of contraception Female tubal ligation In this method, a woman's fallopian tubes are sealed, tied, or blocked during surgery to prevent eggs from traveling to the uterus. Hysteroscopic sterilization In this method, a small, flexible insert is placed into each fallopian tube. The inserts cause scar tissue to form in the fallopian tubes and block them, so sperm cannot reach an egg. The procedure takes about 3 months to be effective. Another form of birth control must be used during those 3 months. Female sterilization This is a procedure to tie off the  tubes that carry sperm (vasectomy). After the procedure, the man can still ejaculate fluid (semen). Natural planning methods Natural family planning In this method, a couple does not have sex on days when the woman could become pregnant. Calendar method This means keeping track of the length   of each menstrual cycle, identifying the days when pregnancy can happen, and not having sex on those days. Ovulation method In this method, a couple avoids sex during ovulation. Symptothermal method This method involves not having sex during ovulation. The woman typically checks for ovulation by watching changes in her temperature and in the consistency of cervical mucus. Post-ovulation method In this method, a couple waits to have sex until after ovulation. Summary  Contraception, also called birth control, means methods or devices that prevent pregnancy.  Hormonal methods of contraception include implants, injections, pills, patches, vaginal rings, and emergency contraceptives.  Barrier methods of contraception can include female condoms, female condoms, diaphragms, cervical caps, sponges, and spermicides.  There are two types of IUDs (intrauterine devices). An IUD can be put in a woman's uterus to prevent pregnancy for 3-5 years.  Permanent sterilization can be done through a procedure for males, females, or both.  Natural family planning methods involve not having sex on days when the woman could become pregnant. This information is not intended to replace advice given to you by your health care provider. Make sure you discuss any questions you have with your health care provider. Document Revised: 11/21/2017 Document Reviewed: 12/22/2016 Else[employer] Patient Education  2020 [employer]   Breastfeeding  Choosing to breastfeed is one of the best decisions you can make for yourself and your baby. A change in hormones during pregnancy causes your breasts to make breast milk in your milk-producing  glands. Hormones prevent breast milk from being released before your baby is born. They also prompt milk flow after birth. Once breastfeeding has begun, thoughts of your baby, as well as his or her sucking or crying, can stimulate the release of milk from your milk-producing glands. Benefits of breastfeeding Research shows that breastfeeding offers many health benefits for infants and mothers. It also offers a cost-free and convenient way to feed your baby. For your baby  Your first milk (colostrum) helps your baby's digestive system to function better.  Special cells in your milk (antibodies) help your baby to fight off infections.  Breastfed babies are less likely to develop asthma, allergies, obesity, or type 2 diabetes. They are also at lower risk for sudden infant death syndrome (SIDS).  Nutrients in breast milk are better able to meet your baby's needs compared to infant formula.  Breast milk improves your baby's brain development. For you  Breastfeeding helps to create a very special bond between you and your baby.  Breastfeeding is convenient. Breast milk costs nothing and is always available at the correct temperature.  Breastfeeding helps to burn calories. It helps you to lose the weight that you gained during pregnancy.  Breastfeeding makes your uterus return faster to its size before pregnancy. It also slows bleeding (lochia) after you give birth.  Breastfeeding helps to lower your risk of developing type 2 diabetes, osteoporosis, rheumatoid arthritis, cardiovascular disease, and breast, ovarian, uterine, and endometrial cancer later in life. Breastfeeding basics Starting breastfeeding  Find a comfortable place to sit or lie down, with your neck and back well-supported.  Place a pillow or a rolled-up blanket under your baby to bring him or her to the level of your breast (if you are seated). Nursing pillows are specially designed to help support your arms and your baby while  you breastfeed.  Make sure that your baby's tummy (abdomen) is facing your abdomen.  Gently massage your breast. With your fingertips, massage from the outer edges of your breast inward toward   the nipple. This encourages milk flow. If your milk flows slowly, you may need to continue this action during the feeding.  Support your breast with 4 fingers underneath and your thumb above your nipple (make the letter "C" with your hand). Make sure your fingers are well away from your nipple and your baby's mouth.  Stroke your baby's lips gently with your finger or nipple.  When your baby's mouth is open wide enough, quickly bring your baby to your breast, placing your entire nipple and as much of the areola as possible into your baby's mouth. The areola is the colored area around your nipple. ? More areola should be visible above your baby's upper lip than below the lower lip. ? Your baby's lips should be opened and extended outward (flanged) to ensure an adequate, comfortable latch. ? Your baby's tongue should be between his or her lower gum and your breast.  Make sure that your baby's mouth is correctly positioned around your nipple (latched). Your baby's lips should create a seal on your breast and be turned out (everted).  It is common for your baby to suck about 2-3 minutes in order to start the flow of breast milk. Latching Teaching your baby how to latch onto your breast properly is very important. An improper latch can cause nipple pain, decreased milk supply, and poor weight gain in your baby. Also, if your baby is not latched onto your nipple properly, he or she may swallow some air during feeding. This can make your baby fussy. Burping your baby when you switch breasts during the feeding can help to get rid of the air. However, teaching your baby to latch on properly is still the best way to prevent fussiness from swallowing air while breastfeeding. Signs that your baby has successfully  latched onto your nipple  Silent tugging or silent sucking, without causing you pain. Infant's lips should be extended outward (flanged).  Swallowing heard between every 3-4 sucks once your milk has started to flow (after your let-down milk reflex occurs).  Muscle movement above and in front of his or her ears while sucking. Signs that your baby has not successfully latched onto your nipple  Sucking sounds or smacking sounds from your baby while breastfeeding.  Nipple pain. If you think your baby has not latched on correctly, slip your finger into the corner of your baby's mouth to break the suction and place it between your baby's gums. Attempt to start breastfeeding again. Signs of successful breastfeeding Signs from your baby  Your baby will gradually decrease the number of sucks or will completely stop sucking.  Your baby will fall asleep.  Your baby's body will relax.  Your baby will retain a small amount of milk in his or her mouth.  Your baby will let go of your breast by himself or herself. Signs from you  Breasts that have increased in firmness, weight, and size 1-3 hours after feeding.  Breasts that are softer immediately after breastfeeding.  Increased milk volume, as well as a change in milk consistency and color by the fifth day of breastfeeding.  Nipples that are not sore, cracked, or bleeding. Signs that your baby is getting enough milk  Wetting at least 1-2 diapers during the first 24 hours after birth.  Wetting at least 5-6 diapers every 24 hours for the first week after birth. The urine should be clear or pale yellow by the age of 5 days.  Wetting 6-8 diapers every 24 hours as   your baby continues to grow and develop.  At least 3 stools in a 24-hour period by the age of 5 days. The stool should be soft and yellow.  At least 3 stools in a 24-hour period by the age of 7 days. The stool should be seedy and yellow.  No loss of weight greater than 10% of  birth weight during the first 3 days of life.  Average weight gain of 4-7 oz (113-198 g) per week after the age of 4 days.  Consistent daily weight gain by the age of 5 days, without weight loss after the age of 2 weeks. After a feeding, your baby may spit up a small amount of milk. This is normal. Breastfeeding frequency and duration Frequent feeding will help you make more milk and can prevent sore nipples and extremely full breasts (breast engorgement). Breastfeed when you feel the need to reduce the fullness of your breasts or when your baby shows signs of hunger. This is called "breastfeeding on demand." Signs that your baby is hungry include:  Increased alertness, activity, or restlessness.  Movement of the head from side to side.  Opening of the mouth when the corner of the mouth or cheek is stroked (rooting).  Increased sucking sounds, smacking lips, cooing, sighing, or squeaking.  Hand-to-mouth movements and sucking on fingers or hands.  Fussing or crying. Avoid introducing a pacifier to your baby in the first 4-6 weeks after your baby is born. After this time, you may choose to use a pacifier. Research has shown that pacifier use during the first year of a baby's life decreases the risk of sudden infant death syndrome (SIDS). Allow your baby to feed on each breast as long as he or she wants. When your baby unlatches or falls asleep while feeding from the first breast, offer the second breast. Because newborns are often sleepy in the first few weeks of life, you may need to awaken your baby to get him or her to feed. Breastfeeding times will vary from baby to baby. However, the following rules can serve as a guide to help you make sure that your baby is properly fed:  Newborns (babies 4 weeks of age or younger) may breastfeed every 1-3 hours.  Newborns should not go without breastfeeding for longer than 3 hours during the day or 5 hours during the night.  You should breastfeed  your baby a minimum of 8 times in a 24-hour period. Breast milk pumping     Pumping and storing breast milk allows you to make sure that your baby is exclusively fed your breast milk, even at times when you are unable to breastfeed. This is especially important if you go back to work while you are still breastfeeding, or if you are not able to be present during feedings. Your lactation consultant can help you find a method of pumping that works best for you and give you guidelines about how long it is safe to store breast milk. Caring for your breasts while you breastfeed Nipples can become dry, cracked, and sore while breastfeeding. The following recommendations can help keep your breasts moisturized and healthy:  Avoid using soap on your nipples.  Wear a supportive bra designed especially for nursing. Avoid wearing underwire-style bras or extremely tight bras (sports bras).  Air-dry your nipples for 3-4 minutes after each feeding.  Use only cotton bra pads to absorb leaked breast milk. Leaking of breast milk between feedings is normal.  Use lanolin on your nipples   after breastfeeding. Lanolin helps to maintain your skin's normal moisture barrier. Pure lanolin is not harmful (not toxic) to your baby. You may also hand express a few drops of breast milk and gently massage that milk into your nipples and allow the milk to air-dry. In the first few weeks after giving birth, some women experience breast engorgement. Engorgement can make your breasts feel heavy, warm, and tender to the touch. Engorgement peaks within 3-5 days after you give birth. The following recommendations can help to ease engorgement:  Completely empty your breasts while breastfeeding or pumping. You may want to start by applying warm, moist heat (in the shower or with warm, water-soaked hand towels) just before feeding or pumping. This increases circulation and helps the milk flow. If your baby does not completely empty your  breasts while breastfeeding, pump any extra milk after he or she is finished.  Apply ice packs to your breasts immediately after breastfeeding or pumping, unless this is too uncomfortable for you. To do this: ? Put ice in a plastic bag. ? Place a towel between your skin and the bag. ? Leave the ice on for 20 minutes, 2-3 times a day.  Make sure that your baby is latched on and positioned properly while breastfeeding. If engorgement persists after 48 hours of following these recommendations, contact your health care provider or a lactation consultant. Overall health care recommendations while breastfeeding  Eat 3 healthy meals and 3 snacks every day. Well-nourished mothers who are breastfeeding need an additional 450-500 calories a day. You can meet this requirement by increasing the amount of a balanced diet that you eat.  Drink enough water to keep your urine pale yellow or clear.  Rest often, relax, and continue to take your prenatal vitamins to prevent fatigue, stress, and low vitamin and mineral levels in your body (nutrient deficiencies).  Do not use any products that contain nicotine or tobacco, such as cigarettes and e-cigarettes. Your baby may be harmed by chemicals from cigarettes that pass into breast milk and exposure to secondhand smoke. If you need help quitting, ask your health care provider.  Avoid alcohol.  Do not use illegal drugs or marijuana.  Talk with your health care provider before taking any medicines. These include over-the-counter and prescription medicines as well as vitamins and herbal supplements. Some medicines that may be harmful to your baby can pass through breast milk.  It is possible to become pregnant while breastfeeding. If birth control is desired, ask your health care provider about options that will be safe while breastfeeding your baby. Where to find more information: [employer]: www.llli.org Contact a health care provider  if:  You feel like you want to stop breastfeeding or have become frustrated with breastfeeding.  Your nipples are cracked or bleeding.  Your breasts are red, tender, or warm.  You have: ? Painful breasts or nipples. ? A swollen area on either breast. ? A fever or chills. ? Nausea or vomiting. ? Drainage other than breast milk from your nipples.  Your breasts do not become full before feedings by the fifth day after you give birth.  You feel sad and depressed.  Your baby is: ? Too sleepy to eat well. ? Having trouble sleeping. ? More than 1 week old and wetting fewer than 6 diapers in a 24-hour period. ? Not gaining weight by 5 days of age.  Your baby has fewer than 3 stools in a 24-hour period.  Your baby's skin or   the white parts of his or her eyes become yellow. Get help right away if:  Your baby is overly tired (lethargic) and does not want to wake up and feed.  Your baby develops an unexplained fever. Summary  Breastfeeding offers many health benefits for infant and mothers.  Try to breastfeed your infant when he or she shows early signs of hunger.  Gently tickle or stroke your baby's lips with your finger or nipple to allow the baby to open his or her mouth. Bring the baby to your breast. Make sure that much of the areola is in your baby's mouth. Offer one side and burp the baby before you offer the other side.  Talk with your health care provider or lactation consultant if you have questions or you face problems as you breastfeed. This information is not intended to replace advice given to you by your health care provider. Make sure you discuss any questions you have with your health care provider. Document Revised: 02/13/2018 Document Reviewed: 12/21/2016 Elsevier Patient Education  2020 Elsevier Inc.  

## 2020-11-11 NOTE — Progress Notes (Signed)
Subjective:  Monica Vazquez is a 24 y.o. G2P0010 at [redacted]w[redacted]d being seen today for ongoing prenatal care.  She is currently monitored for the following issues for this high-risk pregnancy and has Encounter for supervision of normal pregnancy, antepartum; Suboxone maintenance treatment complicating pregnancy, antepartum ([place]); Smoker; Anxiety; Maternal UTI (urinary tract infection), first trimester; Rubella non-immune status, antepartum; Cocaine abuse affecting pregnancy in first trimester ([place]); Marijuana use; and Abn chromsoml and genetic find on antenat screen of mother on their problem list.  Patient reports no complaints.  Contractions: Not present. Vag. Bleeding: None.  Movement: Absent. Denies leaking of fluid.   The following portions of the patient's history were reviewed and updated as appropriate: allergies, current medications, past family history, past medical history, past social history, past surgical history and problem list. Problem list updated.  Objective:   Vitals:   11/11/20 1104  BP: 102/61  Pulse: 91  Weight: 178 lb 8 oz (81 kg)    Fetal Status: Fetal Heart Rate (bpm): 145   Movement: Absent     General:  Alert, oriented and cooperative. Patient is in no acute distress.  Skin: Skin is warm and dry. No rash noted.   Cardiovascular: Normal heart rate noted  Respiratory: Normal respiratory effort, no problems with respiration noted  Abdomen: Soft, gravid, appropriate for gestational age. Pain/Pressure: Present     Pelvic: Vag. Bleeding: None     Normal external genitalia, normal vaginal mucosa and cervix        Extremities: Normal range of motion.  Edema: None  Mental Status: Normal mood and affect. Normal behavior. Normal judgment and thought content.   Urinalysis:      Assessment and Plan:  Pregnancy: G2P0010 at [redacted]w[redacted]d  1. Supervision of other normal pregnancy, antepartum Patient transferring from [facility] for suboxone treatment, see below Reviewed  structure of Faculty practice with patient, that they may not always see the same provider, and that physicians, fellows, CNM, NP, PA, residents, and medical students may all be involved in their care. NT normal, after visit noted that she needs second blood draw for integrated testing, will draw at next visit in 2 weeks Recently had COVID and received monoclonal Ab's, would like to receive vaccine, instructed to wait 90 days post-Ab's and then OK for vaccine Prenatal labs notable for rubella NI, otherwise unremarkable  2. Suboxone maintenance treatment complicating pregnancy, antepartum ([place]) Reviewed hx with patient, currently in remission and stable on suboxone Previously with polysubstance use disorder including opioids, cocaine Has only used suboxone since finding out she was pregnant Currently feels like suboxone works for her though does start to have withdrawal in AM Until now has been obtaining suboxone illicitly, rx sent for $Remove'4mg'CljtiNj$  BID, discussed that we can increase total dose to $Remov'12mg'ujJREn$  to help with AM cravings if she feels that her symptoms are not well controlled Reviewed evidence that stable treatment on suboxone has the best outcomes for mother and baby Briefly reviewed typical NICU course for NAS infants, will send referral to NICU OUD team for formal consult Will discuss SW implications at future visit  3. Smoker Discuss at next visit  4. Anxiety Offer [place] at next visit  5. Rubella non-immune status, antepartum MMR PP  6. Cocaine abuse affecting pregnancy in first trimester ([place]) In remission, no use this pregnancy  7. Marijuana use Not active currently  8. Maternal UTI (urinary tract infection), first trimester TOC with mixed urogenital flora  Preterm labor symptoms and general obstetric precautions  including but not limited to vaginal bleeding, contractions, leaking of fluid and fetal movement were reviewed in detail with the patient. Please refer to After Visit Summary  for other counseling recommendations.  Return in 2 weeks (on 11/25/2020) for [facility], needs MD, ob visit.   Clarnce Flock, MD

## 2020-11-12 NOTE — Telephone Encounter (Signed)
Appreciate speedy consult.

## 2020-11-14 NOTE — BH Specialist Note (Signed)
Integrated Behavioral Health via Telemedicine Visit  11/14/2020 GREYSEN SWANTON [id number]  Pt did not arrive to video visit and did not answer the phone ; Female answered number listed and said "she's at home now", and said she could be reached at [phone]; Left HIPPA-compliant message to call back [name] from [facility] [employer] at [facility] at [phone] (main office) or [phone] (Tenlee Wollin's office).  ; left MyChart message for patient.    Valetta Close Elsye Mccollister, LCSW

## 2020-11-15 ENCOUNTER — Ambulatory Visit: Payer: HRSA Program | Admitting: Clinical

## 2020-11-15 DIAGNOSIS — Z91199 Patient's noncompliance with other medical treatment and regimen due to unspecified reason: Secondary | ICD-10-CM

## 2020-11-16 LAB — CYTOLOGY - PAP
Comment: NEGATIVE
Diagnosis: UNDETERMINED — AB
High risk HPV: NEGATIVE

## 2020-11-17 ENCOUNTER — Encounter: Payer: Medicaid Other | Admitting: Advanced Practice Midwife

## 2020-11-17 ENCOUNTER — Telehealth: Payer: Self-pay | Admitting: Clinical

## 2020-11-17 ENCOUNTER — Encounter: Payer: Self-pay | Admitting: *Deleted

## 2020-11-17 NOTE — Telephone Encounter (Signed)
Attempt to reschedule pt's missed appointment; Left HIPPA-compliant message to call back [name] from [employer] for [employer] at [facility] at [phone] [facility] office).

## 2020-11-18 ENCOUNTER — Other Ambulatory Visit: Payer: Self-pay | Admitting: Family Medicine

## 2020-11-18 MED ORDER — ONDANSETRON 4 MG PO TBDP: 4.0000 mg | ORAL_TABLET | Freq: Three times a day (TID) | ORAL | 2 refills | Status: DC | PRN

## 2020-11-24 ENCOUNTER — Encounter: Payer: Self-pay | Admitting: Family Medicine

## 2020-11-24 ENCOUNTER — Ambulatory Visit (INDEPENDENT_AMBULATORY_CARE_PROVIDER_SITE_OTHER): Payer: Medicaid Other | Admitting: Family Medicine

## 2020-11-24 ENCOUNTER — Other Ambulatory Visit: Payer: Self-pay

## 2020-11-24 ENCOUNTER — Telehealth: Payer: Self-pay | Admitting: Family Medicine

## 2020-11-24 VITALS — BP 109/74 | HR 104 | Wt 182.5 lb

## 2020-11-24 DIAGNOSIS — F1120 Opioid dependence, uncomplicated: Secondary | ICD-10-CM

## 2020-11-24 DIAGNOSIS — O99320 Drug use complicating pregnancy, unspecified trimester: Secondary | ICD-10-CM

## 2020-11-24 DIAGNOSIS — O09899 Supervision of other high risk pregnancies, unspecified trimester: Secondary | ICD-10-CM

## 2020-11-24 DIAGNOSIS — F419 Anxiety disorder, unspecified: Secondary | ICD-10-CM

## 2020-11-24 DIAGNOSIS — Z03818 Encounter for observation for suspected exposure to other biological agents ruled out: Secondary | ICD-10-CM | POA: Diagnosis not present

## 2020-11-24 DIAGNOSIS — Z3480 Encounter for supervision of other normal pregnancy, unspecified trimester: Secondary | ICD-10-CM

## 2020-11-24 DIAGNOSIS — O99891 Other specified diseases and conditions complicating pregnancy: Secondary | ICD-10-CM

## 2020-11-24 DIAGNOSIS — Z283 Underimmunization status: Secondary | ICD-10-CM

## 2020-11-24 DIAGNOSIS — F17200 Nicotine dependence, unspecified, uncomplicated: Secondary | ICD-10-CM

## 2020-11-24 MED ORDER — BUPRENORPHINE HCL-NALOXONE HCL 4-1 MG SL FILM: 4.0000 mg | ORAL_FILM | SUBLINGUAL | 0 refills | Status: DC

## 2020-11-24 MED ORDER — FAMOTIDINE 20 MG PO TABS: 20.0000 mg | ORAL_TABLET | Freq: Every day | ORAL | 1 refills | Status: DC

## 2020-11-24 NOTE — Patient Instructions (Signed)
 Second Trimester of Pregnancy The second trimester is from week 14 through week 27 (months 4 through 6). The second trimester is often a time when you feel your best. Your body has adjusted to being pregnant, and you begin to feel better physically. Usually, morning sickness has lessened or quit completely, you may have more energy, and you may have an increase in appetite. The second trimester is also a time when the fetus is growing rapidly. At the end of the sixth month, the fetus is about 9 inches long and weighs about 1 pounds. You will likely begin to feel the baby move (quickening) between 16 and 20 weeks of pregnancy. Body changes during your second trimester Your body continues to go through many changes during your second trimester. The changes vary from woman to woman.  Your weight will continue to increase. You will notice your lower abdomen bulging out.  You may begin to get stretch marks on your hips, abdomen, and breasts.  You may develop headaches that can be relieved by medicines. The medicines should be approved by your health care provider.  You may urinate more often because the fetus is pressing on your bladder.  You may develop or continue to have heartburn as a result of your pregnancy.  You may develop constipation because certain hormones are causing the muscles that push waste through your intestines to slow down.  You may develop hemorrhoids or swollen, bulging veins (varicose veins).  You may have back pain. This is caused by: ? Weight gain. ? Pregnancy hormones that are relaxing the joints in your pelvis. ? A shift in weight and the muscles that support your balance.  Your breasts will continue to grow and they will continue to become tender.  Your gums may bleed and may be sensitive to brushing and flossing.  Dark spots or blotches (chloasma, mask of pregnancy) may develop on your face. This will likely fade after the baby is born.  A dark line from  your belly button to the pubic area (linea nigra) may appear. This will likely fade after the baby is born.  You may have changes in your hair. These can include thickening of your hair, rapid growth, and changes in texture. Some women also have hair loss during or after pregnancy, or hair that feels dry or thin. Your hair will most likely return to normal after your baby is born. What to expect at prenatal visits During a routine prenatal visit:  You will be weighed to make sure you and the fetus are growing normally.  Your blood pressure will be taken.  Your abdomen will be measured to track your baby's growth.  The fetal heartbeat will be listened to.  Any test results from the previous visit will be discussed. Your health care provider may ask you:  How you are feeling.  If you are feeling the baby move.  If you have had any abnormal symptoms, such as leaking fluid, bleeding, severe headaches, or abdominal cramping.  If you are using any tobacco products, including cigarettes, chewing tobacco, and electronic cigarettes.  If you have any questions. Other tests that may be performed during your second trimester include:  Blood tests that check for: ? Low iron levels (anemia). ? High blood sugar that affects pregnant women (gestational diabetes) between 24 and 28 weeks. ? Rh antibodies. This is to check for a protein on red blood cells (Rh factor).  Urine tests to check for infections, diabetes, or protein in   the urine.  An ultrasound to confirm the proper growth and development of the baby.  An amniocentesis to check for possible genetic problems.  Fetal screens for spina bifida and Down syndrome.  HIV (human immunodeficiency virus) testing. Routine prenatal testing includes screening for HIV, unless you choose not to have this test. Follow these instructions at home: Medicines  Follow your health care provider's instructions regarding medicine use. Specific medicines  may be either safe or unsafe to take during pregnancy.  Take a prenatal vitamin that contains at least 600 micrograms (mcg) of folic acid.  If you develop constipation, try taking a stool softener if your health care provider approves. Eating and drinking   Eat a balanced diet that includes fresh fruits and vegetables, whole grains, good sources of protein such as meat, eggs, or tofu, and low-fat dairy. Your health care provider will help you determine the amount of weight gain that is right for you.  Avoid raw meat and uncooked cheese. These carry germs that can cause birth defects in the baby.  If you have low calcium intake from food, talk to your health care provider about whether you should take a daily calcium supplement.  Limit foods that are high in fat and processed sugars, such as fried and sweet foods.  To prevent constipation: ? Drink enough fluid to keep your urine clear or pale yellow. ? Eat foods that are high in fiber, such as fresh fruits and vegetables, whole grains, and beans. Activity  Exercise only as directed by your health care provider. Most women can continue their usual exercise routine during pregnancy. Try to exercise for 30 minutes at least 5 days a week. Stop exercising if you experience uterine contractions.  Avoid heavy lifting, wear low heel shoes, and practice good posture.  A sexual relationship may be continued unless your health care provider directs you otherwise. Relieving pain and discomfort  Wear a good support bra to prevent discomfort from breast tenderness.  Take warm sitz baths to soothe any pain or discomfort caused by hemorrhoids. Use hemorrhoid cream if your health care provider approves.  Rest with your legs elevated if you have leg cramps or low back pain.  If you develop varicose veins, wear support hose. Elevate your feet for 15 minutes, 3-4 times a day. Limit salt in your diet. Prenatal Care  Write down your questions. Take  them to your prenatal visits.  Keep all your prenatal visits as told by your health care provider. This is important. Safety  Wear your seat belt at all times when driving.  Make a list of emergency phone numbers, including numbers for family, friends, the hospital, and police and fire departments. General instructions  Ask your health care provider for a referral to a local prenatal education class. Begin classes no later than the beginning of month 6 of your pregnancy.  Ask for help if you have counseling or nutritional needs during pregnancy. Your health care provider can offer advice or refer you to specialists for help with various needs.  Do not use hot tubs, steam rooms, or saunas.  Do not douche or use tampons or scented sanitary pads.  Do not cross your legs for long periods of time.  Avoid cat litter boxes and soil used by cats. These carry germs that can cause birth defects in the baby and possibly loss of the fetus by miscarriage or stillbirth.  Avoid all smoking, herbs, alcohol, and unprescribed drugs. Chemicals in these products can affect the   formation and growth of the baby.  Do not use any products that contain nicotine or tobacco, such as cigarettes and e-cigarettes. If you need help quitting, ask your health care provider.  Visit your dentist if you have not gone yet during your pregnancy. Use a soft toothbrush to brush your teeth and be gentle when you floss. Contact a health care provider if:  You have dizziness.  You have mild pelvic cramps, pelvic pressure, or nagging pain in the abdominal area.  You have persistent nausea, vomiting, or diarrhea.  You have a bad smelling vaginal discharge.  You have pain when you urinate. Get help right away if:  You have a fever.  You are leaking fluid from your vagina.  You have spotting or bleeding from your vagina.  You have severe abdominal cramping or pain.  You have rapid weight gain or weight loss.  You  have shortness of breath with chest pain.  You notice sudden or extreme swelling of your face, hands, ankles, feet, or legs.  You have not felt your baby move in over an hour.  You have severe headaches that do not go away when you take medicine.  You have vision changes. Summary  The second trimester is from week 14 through week 27 (months 4 through 6). It is also a time when the fetus is growing rapidly.  Your body goes through many changes during pregnancy. The changes vary from woman to woman.  Avoid all smoking, herbs, alcohol, and unprescribed drugs. These chemicals affect the formation and growth your baby.  Do not use any tobacco products, such as cigarettes, chewing tobacco, and e-cigarettes. If you need help quitting, ask your health care provider.  Contact your health care provider if you have any questions. Keep all prenatal visits as told by your health care provider. This is important. This information is not intended to replace advice given to you by your health care provider. Make sure you discuss any questions you have with your health care provider. Document Revised: 03/13/2019 Document Reviewed: 12/25/2016 [employer] Patient Education  2020 [employer]   Contraception Choices Contraception, also called birth control, refers to methods or devices that prevent pregnancy. Hormonal methods Contraceptive implant  A contraceptive implant is a thin, plastic tube that contains a hormone. It is inserted into the upper part of the arm. It can remain in place for up to 3 years. Progestin-only injections Progestin-only injections are injections of progestin, a synthetic form of the hormone progesterone. They are given every 3 months by a health care provider. Birth control pills  Birth control pills are pills that contain hormones that prevent pregnancy. They must be taken once a day, preferably at the same time each day. Birth control patch  The birth control patch  contains hormones that prevent pregnancy. It is placed on the skin and must be changed once a week for three weeks and removed on the fourth week. A prescription is needed to use this method of contraception. Vaginal ring  A vaginal ring contains hormones that prevent pregnancy. It is placed in the vagina for three weeks and removed on the fourth week. After that, the process is repeated with a new ring. A prescription is needed to use this method of contraception. Emergency contraceptive Emergency contraceptives prevent pregnancy after unprotected sex. They come in pill form and can be taken up to 5 days after sex. They work best the sooner they are taken after having sex. Most emergency contraceptives are available   without a prescription. This method should not be used as your only form of birth control. Barrier methods Female condom  A female condom is a thin sheath that is worn over the penis during sex. Condoms keep sperm from going inside a woman's body. They can be used with a spermicide to increase their effectiveness. They should be disposed after a single use. Female condom  A female condom is a soft, loose-fitting sheath that is put into the vagina before sex. The condom keeps sperm from going inside a woman's body. They should be disposed after a single use. Diaphragm  A diaphragm is a soft, dome-shaped barrier. It is inserted into the vagina before sex, along with a spermicide. The diaphragm blocks sperm from entering the uterus, and the spermicide kills sperm. A diaphragm should be left in the vagina for 6-8 hours after sex and removed within 24 hours. A diaphragm is prescribed and fitted by a health care provider. A diaphragm should be replaced every 1-2 years, after giving birth, after gaining more than 15 lb (6.8 kg), and after pelvic surgery. Cervical cap  A cervical cap is a round, soft latex or plastic cup that fits over the cervix. It is inserted into the vagina before sex, along  with spermicide. It blocks sperm from entering the uterus. The cap should be left in place for 6-8 hours after sex and removed within 48 hours. A cervical cap must be prescribed and fitted by a health care provider. It should be replaced every 2 years. Sponge  A sponge is a soft, circular piece of polyurethane foam with spermicide on it. The sponge helps block sperm from entering the uterus, and the spermicide kills sperm. To use it, you make it wet and then insert it into the vagina. It should be inserted before sex, left in for at least 6 hours after sex, and removed and thrown away within 30 hours. Spermicides Spermicides are chemicals that kill or block sperm from entering the cervix and uterus. They can come as a cream, jelly, suppository, foam, or tablet. A spermicide should be inserted into the vagina with an applicator at least 10-15 minutes before sex to allow time for it to work. The process must be repeated every time you have sex. Spermicides do not require a prescription. Intrauterine contraception Intrauterine device (IUD) An IUD is a T-shaped device that is put in a woman's uterus. There are two types:  Hormone IUD.This type contains progestin, a synthetic form of the hormone progesterone. This type can stay in place for 3-5 years.  Copper IUD.This type is wrapped in copper wire. It can stay in place for 10 years.  Permanent methods of contraception Female tubal ligation In this method, a woman's fallopian tubes are sealed, tied, or blocked during surgery to prevent eggs from traveling to the uterus. Hysteroscopic sterilization In this method, a small, flexible insert is placed into each fallopian tube. The inserts cause scar tissue to form in the fallopian tubes and block them, so sperm cannot reach an egg. The procedure takes about 3 months to be effective. Another form of birth control must be used during those 3 months. Female sterilization This is a procedure to tie off the  tubes that carry sperm (vasectomy). After the procedure, the man can still ejaculate fluid (semen). Natural planning methods Natural family planning In this method, a couple does not have sex on days when the woman could become pregnant. Calendar method This means keeping track of the length   of each menstrual cycle, identifying the days when pregnancy can happen, and not having sex on those days. Ovulation method In this method, a couple avoids sex during ovulation. Symptothermal method This method involves not having sex during ovulation. The woman typically checks for ovulation by watching changes in her temperature and in the consistency of cervical mucus. Post-ovulation method In this method, a couple waits to have sex until after ovulation. Summary  Contraception, also called birth control, means methods or devices that prevent pregnancy.  Hormonal methods of contraception include implants, injections, pills, patches, vaginal rings, and emergency contraceptives.  Barrier methods of contraception can include female condoms, female condoms, diaphragms, cervical caps, sponges, and spermicides.  There are two types of IUDs (intrauterine devices). An IUD can be put in a woman's uterus to prevent pregnancy for 3-5 years.  Permanent sterilization can be done through a procedure for males, females, or both.  Natural family planning methods involve not having sex on days when the woman could become pregnant. This information is not intended to replace advice given to you by your health care provider. Make sure you discuss any questions you have with your health care provider. Document Revised: 11/21/2017 Document Reviewed: 12/22/2016 Else[employer] Patient Education  2020 [employer]   Breastfeeding  Choosing to breastfeed is one of the best decisions you can make for yourself and your baby. A change in hormones during pregnancy causes your breasts to make breast milk in your milk-producing  glands. Hormones prevent breast milk from being released before your baby is born. They also prompt milk flow after birth. Once breastfeeding has begun, thoughts of your baby, as well as his or her sucking or crying, can stimulate the release of milk from your milk-producing glands. Benefits of breastfeeding Research shows that breastfeeding offers many health benefits for infants and mothers. It also offers a cost-free and convenient way to feed your baby. For your baby  Your first milk (colostrum) helps your baby's digestive system to function better.  Special cells in your milk (antibodies) help your baby to fight off infections.  Breastfed babies are less likely to develop asthma, allergies, obesity, or type 2 diabetes. They are also at lower risk for sudden infant death syndrome (SIDS).  Nutrients in breast milk are better able to meet your baby's needs compared to infant formula.  Breast milk improves your baby's brain development. For you  Breastfeeding helps to create a very special bond between you and your baby.  Breastfeeding is convenient. Breast milk costs nothing and is always available at the correct temperature.  Breastfeeding helps to burn calories. It helps you to lose the weight that you gained during pregnancy.  Breastfeeding makes your uterus return faster to its size before pregnancy. It also slows bleeding (lochia) after you give birth.  Breastfeeding helps to lower your risk of developing type 2 diabetes, osteoporosis, rheumatoid arthritis, cardiovascular disease, and breast, ovarian, uterine, and endometrial cancer later in life. Breastfeeding basics Starting breastfeeding  Find a comfortable place to sit or lie down, with your neck and back well-supported.  Place a pillow or a rolled-up blanket under your baby to bring him or her to the level of your breast (if you are seated). Nursing pillows are specially designed to help support your arms and your baby while  you breastfeed.  Make sure that your baby's tummy (abdomen) is facing your abdomen.  Gently massage your breast. With your fingertips, massage from the outer edges of your breast inward toward   the nipple. This encourages milk flow. If your milk flows slowly, you may need to continue this action during the feeding.  Support your breast with 4 fingers underneath and your thumb above your nipple (make the letter "C" with your hand). Make sure your fingers are well away from your nipple and your baby's mouth.  Stroke your baby's lips gently with your finger or nipple.  When your baby's mouth is open wide enough, quickly bring your baby to your breast, placing your entire nipple and as much of the areola as possible into your baby's mouth. The areola is the colored area around your nipple. ? More areola should be visible above your baby's upper lip than below the lower lip. ? Your baby's lips should be opened and extended outward (flanged) to ensure an adequate, comfortable latch. ? Your baby's tongue should be between his or her lower gum and your breast.  Make sure that your baby's mouth is correctly positioned around your nipple (latched). Your baby's lips should create a seal on your breast and be turned out (everted).  It is common for your baby to suck about 2-3 minutes in order to start the flow of breast milk. Latching Teaching your baby how to latch onto your breast properly is very important. An improper latch can cause nipple pain, decreased milk supply, and poor weight gain in your baby. Also, if your baby is not latched onto your nipple properly, he or she may swallow some air during feeding. This can make your baby fussy. Burping your baby when you switch breasts during the feeding can help to get rid of the air. However, teaching your baby to latch on properly is still the best way to prevent fussiness from swallowing air while breastfeeding. Signs that your baby has successfully  latched onto your nipple  Silent tugging or silent sucking, without causing you pain. Infant's lips should be extended outward (flanged).  Swallowing heard between every 3-4 sucks once your milk has started to flow (after your let-down milk reflex occurs).  Muscle movement above and in front of his or her ears while sucking. Signs that your baby has not successfully latched onto your nipple  Sucking sounds or smacking sounds from your baby while breastfeeding.  Nipple pain. If you think your baby has not latched on correctly, slip your finger into the corner of your baby's mouth to break the suction and place it between your baby's gums. Attempt to start breastfeeding again. Signs of successful breastfeeding Signs from your baby  Your baby will gradually decrease the number of sucks or will completely stop sucking.  Your baby will fall asleep.  Your baby's body will relax.  Your baby will retain a small amount of milk in his or her mouth.  Your baby will let go of your breast by himself or herself. Signs from you  Breasts that have increased in firmness, weight, and size 1-3 hours after feeding.  Breasts that are softer immediately after breastfeeding.  Increased milk volume, as well as a change in milk consistency and color by the fifth day of breastfeeding.  Nipples that are not sore, cracked, or bleeding. Signs that your baby is getting enough milk  Wetting at least 1-2 diapers during the first 24 hours after birth.  Wetting at least 5-6 diapers every 24 hours for the first week after birth. The urine should be clear or pale yellow by the age of 5 days.  Wetting 6-8 diapers every 24 hours as   your baby continues to grow and develop.  At least 3 stools in a 24-hour period by the age of 5 days. The stool should be soft and yellow.  At least 3 stools in a 24-hour period by the age of 7 days. The stool should be seedy and yellow.  No loss of weight greater than 10% of  birth weight during the first 3 days of life.  Average weight gain of 4-7 oz (113-198 g) per week after the age of 4 days.  Consistent daily weight gain by the age of 5 days, without weight loss after the age of 2 weeks. After a feeding, your baby may spit up a small amount of milk. This is normal. Breastfeeding frequency and duration Frequent feeding will help you make more milk and can prevent sore nipples and extremely full breasts (breast engorgement). Breastfeed when you feel the need to reduce the fullness of your breasts or when your baby shows signs of hunger. This is called "breastfeeding on demand." Signs that your baby is hungry include:  Increased alertness, activity, or restlessness.  Movement of the head from side to side.  Opening of the mouth when the corner of the mouth or cheek is stroked (rooting).  Increased sucking sounds, smacking lips, cooing, sighing, or squeaking.  Hand-to-mouth movements and sucking on fingers or hands.  Fussing or crying. Avoid introducing a pacifier to your baby in the first 4-6 weeks after your baby is born. After this time, you may choose to use a pacifier. Research has shown that pacifier use during the first year of a baby's life decreases the risk of sudden infant death syndrome (SIDS). Allow your baby to feed on each breast as long as he or she wants. When your baby unlatches or falls asleep while feeding from the first breast, offer the second breast. Because newborns are often sleepy in the first few weeks of life, you may need to awaken your baby to get him or her to feed. Breastfeeding times will vary from baby to baby. However, the following rules can serve as a guide to help you make sure that your baby is properly fed:  Newborns (babies 4 weeks of age or younger) may breastfeed every 1-3 hours.  Newborns should not go without breastfeeding for longer than 3 hours during the day or 5 hours during the night.  You should breastfeed  your baby a minimum of 8 times in a 24-hour period. Breast milk pumping     Pumping and storing breast milk allows you to make sure that your baby is exclusively fed your breast milk, even at times when you are unable to breastfeed. This is especially important if you go back to work while you are still breastfeeding, or if you are not able to be present during feedings. Your lactation consultant can help you find a method of pumping that works best for you and give you guidelines about how long it is safe to store breast milk. Caring for your breasts while you breastfeed Nipples can become dry, cracked, and sore while breastfeeding. The following recommendations can help keep your breasts moisturized and healthy:  Avoid using soap on your nipples.  Wear a supportive bra designed especially for nursing. Avoid wearing underwire-style bras or extremely tight bras (sports bras).  Air-dry your nipples for 3-4 minutes after each feeding.  Use only cotton bra pads to absorb leaked breast milk. Leaking of breast milk between feedings is normal.  Use lanolin on your nipples   after breastfeeding. Lanolin helps to maintain your skin's normal moisture barrier. Pure lanolin is not harmful (not toxic) to your baby. You may also hand express a few drops of breast milk and gently massage that milk into your nipples and allow the milk to air-dry. In the first few weeks after giving birth, some women experience breast engorgement. Engorgement can make your breasts feel heavy, warm, and tender to the touch. Engorgement peaks within 3-5 days after you give birth. The following recommendations can help to ease engorgement:  Completely empty your breasts while breastfeeding or pumping. You may want to start by applying warm, moist heat (in the shower or with warm, water-soaked hand towels) just before feeding or pumping. This increases circulation and helps the milk flow. If your baby does not completely empty your  breasts while breastfeeding, pump any extra milk after he or she is finished.  Apply ice packs to your breasts immediately after breastfeeding or pumping, unless this is too uncomfortable for you. To do this: ? Put ice in a plastic bag. ? Place a towel between your skin and the bag. ? Leave the ice on for 20 minutes, 2-3 times a day.  Make sure that your baby is latched on and positioned properly while breastfeeding. If engorgement persists after 48 hours of following these recommendations, contact your health care provider or a lactation consultant. Overall health care recommendations while breastfeeding  Eat 3 healthy meals and 3 snacks every day. Well-nourished mothers who are breastfeeding need an additional 450-500 calories a day. You can meet this requirement by increasing the amount of a balanced diet that you eat.  Drink enough water to keep your urine pale yellow or clear.  Rest often, relax, and continue to take your prenatal vitamins to prevent fatigue, stress, and low vitamin and mineral levels in your body (nutrient deficiencies).  Do not use any products that contain nicotine or tobacco, such as cigarettes and e-cigarettes. Your baby may be harmed by chemicals from cigarettes that pass into breast milk and exposure to secondhand smoke. If you need help quitting, ask your health care provider.  Avoid alcohol.  Do not use illegal drugs or marijuana.  Talk with your health care provider before taking any medicines. These include over-the-counter and prescription medicines as well as vitamins and herbal supplements. Some medicines that may be harmful to your baby can pass through breast milk.  It is possible to become pregnant while breastfeeding. If birth control is desired, ask your health care provider about options that will be safe while breastfeeding your baby. Where to find more information: [employer]: www.llli.org Contact a health care provider  if:  You feel like you want to stop breastfeeding or have become frustrated with breastfeeding.  Your nipples are cracked or bleeding.  Your breasts are red, tender, or warm.  You have: ? Painful breasts or nipples. ? A swollen area on either breast. ? A fever or chills. ? Nausea or vomiting. ? Drainage other than breast milk from your nipples.  Your breasts do not become full before feedings by the fifth day after you give birth.  You feel sad and depressed.  Your baby is: ? Too sleepy to eat well. ? Having trouble sleeping. ? More than 1 week old and wetting fewer than 6 diapers in a 24-hour period. ? Not gaining weight by 5 days of age.  Your baby has fewer than 3 stools in a 24-hour period.  Your baby's skin or   the white parts of his or her eyes become yellow. Get help right away if:  Your baby is overly tired (lethargic) and does not want to wake up and feed.  Your baby develops an unexplained fever. Summary  Breastfeeding offers many health benefits for infant and mothers.  Try to breastfeed your infant when he or she shows early signs of hunger.  Gently tickle or stroke your baby's lips with your finger or nipple to allow the baby to open his or her mouth. Bring the baby to your breast. Make sure that much of the areola is in your baby's mouth. Offer one side and burp the baby before you offer the other side.  Talk with your health care provider or lactation consultant if you have questions or you face problems as you breastfeed. This information is not intended to replace advice given to you by your health care provider. Make sure you discuss any questions you have with your health care provider. Document Revised: 02/13/2018 Document Reviewed: 12/21/2016 Elsevier Patient Education  2020 Elsevier Inc.  

## 2020-11-24 NOTE — Addendum Note (Signed)
Addended by: Merian Capron on: 11/24/2020 01:44 PM   Modules accepted: Orders

## 2020-11-24 NOTE — Telephone Encounter (Signed)
Patient called to say she needed her Rx sent to the pharmacist because they have not received it yet.

## 2020-11-24 NOTE — Telephone Encounter (Signed)
Patient's concerns addressed in different encounter.

## 2020-11-24 NOTE — Progress Notes (Signed)
° °  Subjective:  Monica Vazquez is a 24 y.o. G2P0010 at 75w0dbeing seen today for ongoing prenatal care.  She is currently monitored for the following issues for this high-risk pregnancy and has Encounter for supervision of normal pregnancy, antepartum; Suboxone maintenance treatment complicating pregnancy, antepartum ([place]; Smoker; Anxiety; Maternal UTI (urinary tract infection), first trimester; Rubella non-immune status, antepartum; Cocaine abuse affecting pregnancy in first trimester ([place]; Marijuana use; and Abn chromsoml and genetic find on antenat screen of mother on their problem list.  Patient reports feeling some withdrawal symptoms that peak over the course of the day, usually OK in the evenings and mornings.  Contractions: Not present. Vag. Bleeding: None.  Movement: Absent. Denies leaking of fluid.   The following portions of the patient's history were reviewed and updated as appropriate: allergies, current medications, past family history, past medical history, past social history, past surgical history and problem list. Problem list updated.  Objective:   Vitals:   11/24/20 1028  BP: 109/74  Pulse: (!) 104  Weight: 182 lb 8 oz (82.8 kg)    Fetal Status: Fetal Heart Rate (bpm): 145   Movement: Absent     General:  Alert, oriented and cooperative. Patient is in no acute distress.  Skin: Skin is warm and dry. No rash noted.   Cardiovascular: Normal heart rate noted  Respiratory: Normal respiratory effort, no problems with respiration noted  Abdomen: Soft, gravid, appropriate for gestational age. Pain/Pressure: Present     Pelvic: Vag. Bleeding: None     Cervical exam deferred        Extremities: Normal range of motion.  Edema: None  Mental Status: Normal mood and affect. Normal behavior. Normal judgment and thought content.   Urinalysis:      Assessment and Plan:  Pregnancy: G2P0010 at 157w0d1. Supervision of other normal pregnancy, antepartum FHR and BP  normal Reviewed labs from last visit, has anatomy scan scheduled Reviewed [facility] model, she is interested should it be available once she delivers - famotidine (PEPCID) 20 MG tablet; Take 1 tablet (20 mg total) by mouth daily.  Dispense: 30 tablet; Refill: 1 - Serum Integrated 2  2. Suboxone maintenance treatment complicating pregnancy, antepartum ([facility] feels like she's mostly doing well on current dose of 44m28mID but starting to have cravings that peak in the middle of the day Usually feels OK when she wakes up but often takes evening dose early to help with cravings Will increase regiment to 8mg79m, 44mg 98mRTC in 2 weeks   3. Smoker Still vaping, discussed association with prolonged NAS, encrouaged her to taper off  4. Rubella non-immune status, antepartum MMR PP  5. Anxiety   Preterm labor symptoms and general obstetric precautions including but not limited to vaginal bleeding, contractions, leaking of fluid and fetal movement were reviewed in detail with the patient. Please refer to After Visit Summary for other counseling recommendations.  Return [facility] 2 weeks (on 12/08/2020[facility]   EckstClarnce Flock

## 2020-12-03 NOTE — L&D Delivery Note (Addendum)
OB/GYN Faculty Practice Delivery Note  Monica Vazquez is a 25 y.o. G2P1011 s/p NSVD at [redacted]w[redacted]d. She was admitted for IOL for oligohydramnios.   ROM: 5h 4m with clear fluid GBS Status:  Positive/-- (05/06 1141), adequately treated Maximum Maternal Temperature: 98.7 F   Labor Progress: . Patient arrived at 1 cm dilation late in the evening the day prior to delivery and was induced with misoprostol, foley bulb, pitocin, and AROM. .   Delivery Date/Time: 05/04/2021 at 1722 Delivery: Called to room and patient was complete and pushing. Head delivered in LOA position. No nuchal cord present. Shoulder and body delivered in usual fashion. Infant with spontaneous cry, placed on mother's abdomen, dried and stimulated. Cord clamped x 2 after 1-minute delay, and cut by FOB [place]. Cord blood drawn. Placenta delivered spontaneously with gentle cord traction. Fundus firm with massage and Pitocin. Labia, perineum, vagina, and cervix inspected with bilateral labial lacerations that were repaired in subcuticular fashion with 4-0 monocryl.   Placenta: 3v, intact. To L&D Complications: none Lacerations: bilateral labial, repaired with 4-0 monocryl EBL: 125 cc Analgesia: epidural, local   Infant: [name]  APGAR (1 MIN): 8   APGAR (5 MINS): 9    Weight: 2860 grams  Venora Maples, MD/MPH Attending Family Medicine Physician, [facility] for [facility], [place] [facility]

## 2020-12-05 ENCOUNTER — Telehealth: Payer: Self-pay | Admitting: *Deleted

## 2020-12-05 NOTE — Telephone Encounter (Signed)
VM message left by Elease Hashimoto @ [facility]. She is requesting clarification of pt's Part 2 lab which was ordered as "Serum Integrated test" whereas her part 1 lab was "Integrated test". She requests call back to [phone] option 2, option 2 and reference specimen # [id number]. Message was sent to Dr. Crissie Reese for review as he saw pt @ last visit on 12/23 and ordered part 2 lab.

## 2020-12-06 LAB — INTEGRATED 2
AFP MoM: 1.29
Alpha-Fetoprotein: 40.7 ng/mL
Crown Rump Length: 57.8 mm
DIA MoM: 2.32
DIA Value: 322.7 pg/mL
Estriol, Unconjugated: 1.19 ng/mL
Gest. Age on Collection Date: 12.1 weeks
Gestational Age: 17 weeks
Maternal Age at EDD: 23.1 yr
Nuchal Translucency (NT): 1.1 mm
Nuchal Translucency MoM: 0.85
Number of Fetuses: 1
PAPP-A MoM: 0.9
PAPP-A Value: 637 ng/mL
Test Results:: NEGATIVE
Weight: 179 [lb_av]
Weight: 182 [lb_av]
hCG MoM: 1.42
hCG Value: 37.6 IU/mL
uE3 MoM: 1.08

## 2020-12-06 NOTE — Telephone Encounter (Signed)
Called back and explained clinical scenario, inadvertently ordered "Serum Integrated 2" instead of "Integrated 2". [facility] will change the order so that full interpretation can be reported.

## 2020-12-07 ENCOUNTER — Other Ambulatory Visit: Payer: Self-pay

## 2020-12-07 ENCOUNTER — Ambulatory Visit (INDEPENDENT_AMBULATORY_CARE_PROVIDER_SITE_OTHER): Payer: Medicaid Other | Admitting: Family Medicine

## 2020-12-07 VITALS — BP 120/67 | HR 111 | Wt 183.3 lb

## 2020-12-07 DIAGNOSIS — O99891 Other specified diseases and conditions complicating pregnancy: Secondary | ICD-10-CM

## 2020-12-07 DIAGNOSIS — Z283 Underimmunization status: Secondary | ICD-10-CM

## 2020-12-07 DIAGNOSIS — O2341 Unspecified infection of urinary tract in pregnancy, first trimester: Secondary | ICD-10-CM

## 2020-12-07 DIAGNOSIS — F419 Anxiety disorder, unspecified: Secondary | ICD-10-CM

## 2020-12-07 DIAGNOSIS — Z3480 Encounter for supervision of other normal pregnancy, unspecified trimester: Secondary | ICD-10-CM

## 2020-12-07 DIAGNOSIS — F1120 Opioid dependence, uncomplicated: Secondary | ICD-10-CM

## 2020-12-07 DIAGNOSIS — Z2839 Other underimmunization status: Secondary | ICD-10-CM

## 2020-12-07 DIAGNOSIS — O99320 Drug use complicating pregnancy, unspecified trimester: Secondary | ICD-10-CM

## 2020-12-07 MED ORDER — POLYETHYLENE GLYCOL 3350 17 GM/SCOOP PO POWD: 17.0000 g | Freq: Every day | ORAL | 2 refills | Status: DC | PRN

## 2020-12-07 MED ORDER — BUPRENORPHINE HCL-NALOXONE HCL 4-1 MG SL FILM: 4.0000 mg | ORAL_FILM | SUBLINGUAL | 0 refills | Status: DC

## 2020-12-07 NOTE — Progress Notes (Signed)
° °  Subjective:  Monica Vazquez is a 25 y.o. G2P0010 at 36w6dbeing seen today for ongoing prenatal care.  She is currently monitored for the following issues for this high-risk pregnancy and has Encounter for supervision of normal pregnancy, antepartum; Suboxone maintenance treatment complicating pregnancy, antepartum ([place]; Smoker; Anxiety; Maternal UTI (urinary tract infection), first trimester; Rubella non-immune status, antepartum; Cocaine abuse affecting pregnancy in first trimester ([place]Acacia Vill[place]; Marijuana use; and Abn chromsoml and genetic find on antenat screen of mother on their problem list.  Patient reports no complaints.  Contractions: Not present. Vag. Bleeding: None.  Movement: Present. Denies leaking of fluid.   Reports she had some back pain recently and took an oxycodone for pain relief Otherwise has been doing well on increased dose of suboxone   The following portions of the patient's history were reviewed and updated as appropriate: allergies, current medications, past family history, past medical history, past social history, past surgical history and problem list. Problem list updated.  Objective:   Vitals:   12/07/20 1512  BP: 120/67  Pulse: (!) 111  Weight: 183 lb 4.8 oz (83.1 kg)    Fetal Status: Fetal Heart Rate (bpm): 150   Movement: Present     General:  Alert, oriented and cooperative. Patient is in no acute distress.  Skin: Skin is warm and dry. No rash noted.   Cardiovascular: Normal heart rate noted  Respiratory: Normal respiratory effort, no problems with respiration noted  Abdomen: Soft, gravid, appropriate for gestational age. Pain/Pressure: Absent     Pelvic: Vag. Bleeding: None     Cervical exam deferred        Extremities: Normal range of motion.  Edema: None  Mental Status: Normal mood and affect. Normal behavior. Normal judgment and thought content.   Urinalysis:      Assessment and Plan:  Pregnancy: G2P0010 at 171w6d1. Suboxone maintenance  treatment complicating pregnancy, antepartum (HCHickory CornersDoing well on increased dose of 8/4 daily Discussed need for UDS to assess adherence, asked if any other substances would be positive She disclosed use of an oxycodone pill once for back pain. Thanked her for her honesty, encouraged her to call me for alternative treatment options should that happen again Refill sent for suboxone - DRUG MONITOR, BUP,W/CONF,W/NALOXONE,URINE  2. Supervision of other normal pregnancy, antepartum BP and FHR normal Discussed normal genetic screening results Anatomy scan scheduled for later this week  3. Rubella non-immune status, antepartum MMR PP  4. Anxiety Mood stable  5. Maternal UTI (urinary tract infection), first trimester TOC neg  Preterm labor symptoms and general obstetric precautions including but not limited to vaginal bleeding, contractions, leaking of fluid and fetal movement were reviewed in detail with the patient. Please refer to After Visit Summary for other counseling recommendations.  [facility] (on 12[facility]21/2020[facility].   EcClarnce FlockMD

## 2020-12-07 NOTE — Patient Instructions (Signed)
 Contraception Choices Contraception, also called birth control, refers to methods or devices that prevent pregnancy. Hormonal methods Contraceptive implant  A contraceptive implant is a thin, plastic tube that contains a hormone. It is inserted into the upper part of the arm. It can remain in place for up to 3 years. Progestin-only injections Progestin-only injections are injections of progestin, a synthetic form of the hormone progesterone. They are given every 3 months by a health care provider. Birth control pills  Birth control pills are pills that contain hormones that prevent pregnancy. They must be taken once a day, preferably at the same time each day. Birth control patch  The birth control patch contains hormones that prevent pregnancy. It is placed on the skin and must be changed once a week for three weeks and removed on the fourth week. A prescription is needed to use this method of contraception. Vaginal ring  A vaginal ring contains hormones that prevent pregnancy. It is placed in the vagina for three weeks and removed on the fourth week. After that, the process is repeated with a new ring. A prescription is needed to use this method of contraception. Emergency contraceptive Emergency contraceptives prevent pregnancy after unprotected sex. They come in pill form and can be taken up to 5 days after sex. They work best the sooner they are taken after having sex. Most emergency contraceptives are available without a prescription. This method should not be used as your only form of birth control. Barrier methods Female condom  A female condom is a thin sheath that is worn over the penis during sex. Condoms keep sperm from going inside a woman's body. They can be used with a spermicide to increase their effectiveness. They should be disposed after a single use. Female condom  A female condom is a soft, loose-fitting sheath that is put into the vagina before sex. The condom keeps  sperm from going inside a woman's body. They should be disposed after a single use. Diaphragm  A diaphragm is a soft, dome-shaped barrier. It is inserted into the vagina before sex, along with a spermicide. The diaphragm blocks sperm from entering the uterus, and the spermicide kills sperm. A diaphragm should be left in the vagina for 6-8 hours after sex and removed within 24 hours. A diaphragm is prescribed and fitted by a health care provider. A diaphragm should be replaced every 1-2 years, after giving birth, after gaining more than 15 lb (6.8 kg), and after pelvic surgery. Cervical cap  A cervical cap is a round, soft latex or plastic cup that fits over the cervix. It is inserted into the vagina before sex, along with spermicide. It blocks sperm from entering the uterus. The cap should be left in place for 6-8 hours after sex and removed within 48 hours. A cervical cap must be prescribed and fitted by a health care provider. It should be replaced every 2 years. Sponge  A sponge is a soft, circular piece of polyurethane foam with spermicide on it. The sponge helps block sperm from entering the uterus, and the spermicide kills sperm. To use it, you make it wet and then insert it into the vagina. It should be inserted before sex, left in for at least 6 hours after sex, and removed and thrown away within 30 hours. Spermicides Spermicides are chemicals that kill or block sperm from entering the cervix and uterus. They can come as a cream, jelly, suppository, foam, or tablet. A spermicide should be inserted into   the vagina with an applicator at least 10-15 minutes before sex to allow time for it to work. The process must be repeated every time you have sex. Spermicides do not require a prescription. Intrauterine contraception Intrauterine device (IUD) An IUD is a T-shaped device that is put in a woman's uterus. There are two types:  Hormone IUD.This type contains progestin, a synthetic form of the  hormone progesterone. This type can stay in place for 3-5 years.  Copper IUD.This type is wrapped in copper wire. It can stay in place for 10 years.  Permanent methods of contraception Female tubal ligation In this method, a woman's fallopian tubes are sealed, tied, or blocked during surgery to prevent eggs from traveling to the uterus. Hysteroscopic sterilization In this method, a small, flexible insert is placed into each fallopian tube. The inserts cause scar tissue to form in the fallopian tubes and block them, so sperm cannot reach an egg. The procedure takes about 3 months to be effective. Another form of birth control must be used during those 3 months. Female sterilization This is a procedure to tie off the tubes that carry sperm (vasectomy). After the procedure, the man can still ejaculate fluid (semen). Natural planning methods Natural family planning In this method, a couple does not have sex on days when the woman could become pregnant. Calendar method This means keeping track of the length of each menstrual cycle, identifying the days when pregnancy can happen, and not having sex on those days. Ovulation method In this method, a couple avoids sex during ovulation. Symptothermal method This method involves not having sex during ovulation. The woman typically checks for ovulation by watching changes in her temperature and in the consistency of cervical mucus. Post-ovulation method In this method, a couple waits to have sex until after ovulation. Summary  Contraception, also called birth control, means methods or devices that prevent pregnancy.  Hormonal methods of contraception include implants, injections, pills, patches, vaginal rings, and emergency contraceptives.  Barrier methods of contraception can include female condoms, female condoms, diaphragms, cervical caps, sponges, and spermicides.  There are two types of IUDs (intrauterine devices). An IUD can be put in a woman's  uterus to prevent pregnancy for 3-5 years.  Permanent sterilization can be done through a procedure for males, females, or both.  Natural family planning methods involve not having sex on days when the woman could become pregnant. This information is not intended to replace advice given to you by your health care provider. Make sure you discuss any questions you have with your health care provider. Document Revised: 11/21/2017 Document Reviewed: 12/22/2016 Elsevier Patient Education  2020 [employer]   Breastfeeding  Choosing to breastfeed is one of the best decisions you can make for yourself and your baby. A change in hormones during pregnancy causes your breasts to make breast milk in your milk-producing glands. Hormones prevent breast milk from being released before your baby is born. They also prompt milk flow after birth. Once breastfeeding has begun, thoughts of your baby, as well as his or her sucking or crying, can stimulate the release of milk from your milk-producing glands. Benefits of breastfeeding Research shows that breastfeeding offers many health benefits for infants and mothers. It also offers a cost-free and convenient way to feed your baby. For your baby  Your first milk (colostrum) helps your baby's digestive system to function better.  Special cells in your milk (antibodies) help your baby to fight off infections.  Breastfed babies are   less likely to develop asthma, allergies, obesity, or type 2 diabetes. They are also at lower risk for sudden infant death syndrome (SIDS).  Nutrients in breast milk are better able to meet your baby's needs compared to infant formula.  Breast milk improves your baby's brain development. For you  Breastfeeding helps to create a very special bond between you and your baby.  Breastfeeding is convenient. Breast milk costs nothing and is always available at the correct temperature.  Breastfeeding helps to burn calories. It helps you  to lose the weight that you gained during pregnancy.  Breastfeeding makes your uterus return faster to its size before pregnancy. It also slows bleeding (lochia) after you give birth.  Breastfeeding helps to lower your risk of developing type 2 diabetes, osteoporosis, rheumatoid arthritis, cardiovascular disease, and breast, ovarian, uterine, and endometrial cancer later in life. Breastfeeding basics Starting breastfeeding  Find a comfortable place to sit or lie down, with your neck and back well-supported.  Place a pillow or a rolled-up blanket under your baby to bring him or her to the level of your breast (if you are seated). Nursing pillows are specially designed to help support your arms and your baby while you breastfeed.  Make sure that your baby's tummy (abdomen) is facing your abdomen.  Gently massage your breast. With your fingertips, massage from the outer edges of your breast inward toward the nipple. This encourages milk flow. If your milk flows slowly, you may need to continue this action during the feeding.  Support your breast with 4 fingers underneath and your thumb above your nipple (make the letter "C" with your hand). Make sure your fingers are well away from your nipple and your baby's mouth.  Stroke your baby's lips gently with your finger or nipple.  When your baby's mouth is open wide enough, quickly bring your baby to your breast, placing your entire nipple and as much of the areola as possible into your baby's mouth. The areola is the colored area around your nipple. ? More areola should be visible above your baby's upper lip than below the lower lip. ? Your baby's lips should be opened and extended outward (flanged) to ensure an adequate, comfortable latch. ? Your baby's tongue should be between his or her lower gum and your breast.  Make sure that your baby's mouth is correctly positioned around your nipple (latched). Your baby's lips should create a seal on your  breast and be turned out (everted).  It is common for your baby to suck about 2-3 minutes in order to start the flow of breast milk. Latching Teaching your baby how to latch onto your breast properly is very important. An improper latch can cause nipple pain, decreased milk supply, and poor weight gain in your baby. Also, if your baby is not latched onto your nipple properly, he or she may swallow some air during feeding. This can make your baby fussy. Burping your baby when you switch breasts during the feeding can help to get rid of the air. However, teaching your baby to latch on properly is still the best way to prevent fussiness from swallowing air while breastfeeding. Signs that your baby has successfully latched onto your nipple  Silent tugging or silent sucking, without causing you pain. Infant's lips should be extended outward (flanged).  Swallowing heard between every 3-4 sucks once your milk has started to flow (after your let-down milk reflex occurs).  Muscle movement above and in front of his or her   ears while sucking. Signs that your baby has not successfully latched onto your nipple  Sucking sounds or smacking sounds from your baby while breastfeeding.  Nipple pain. If you think your baby has not latched on correctly, slip your finger into the corner of your baby's mouth to break the suction and place it between your baby's gums. Attempt to start breastfeeding again. Signs of successful breastfeeding Signs from your baby  Your baby will gradually decrease the number of sucks or will completely stop sucking.  Your baby will fall asleep.  Your baby's body will relax.  Your baby will retain a small amount of milk in his or her mouth.  Your baby will let go of your breast by himself or herself. Signs from you  Breasts that have increased in firmness, weight, and size 1-3 hours after feeding.  Breasts that are softer immediately after breastfeeding.  Increased milk  volume, as well as a change in milk consistency and color by the fifth day of breastfeeding.  Nipples that are not sore, cracked, or bleeding. Signs that your baby is getting enough milk  Wetting at least 1-2 diapers during the first 24 hours after birth.  Wetting at least 5-6 diapers every 24 hours for the first week after birth. The urine should be clear or pale yellow by the age of 5 days.  Wetting 6-8 diapers every 24 hours as your baby continues to grow and develop.  At least 3 stools in a 24-hour period by the age of 5 days. The stool should be soft and yellow.  At least 3 stools in a 24-hour period by the age of 7 days. The stool should be seedy and yellow.  No loss of weight greater than 10% of birth weight during the first 3 days of life.  Average weight gain of 4-7 oz (113-198 g) per week after the age of 4 days.  Consistent daily weight gain by the age of 5 days, without weight loss after the age of 2 weeks. After a feeding, your baby may spit up a small amount of milk. This is normal. Breastfeeding frequency and duration Frequent feeding will help you make more milk and can prevent sore nipples and extremely full breasts (breast engorgement). Breastfeed when you feel the need to reduce the fullness of your breasts or when your baby shows signs of hunger. This is called "breastfeeding on demand." Signs that your baby is hungry include:  Increased alertness, activity, or restlessness.  Movement of the head from side to side.  Opening of the mouth when the corner of the mouth or cheek is stroked (rooting).  Increased sucking sounds, smacking lips, cooing, sighing, or squeaking.  Hand-to-mouth movements and sucking on fingers or hands.  Fussing or crying. Avoid introducing a pacifier to your baby in the first 4-6 weeks after your baby is born. After this time, you may choose to use a pacifier. Research has shown that pacifier use during the first year of a baby's life  decreases the risk of sudden infant death syndrome (SIDS). Allow your baby to feed on each breast as long as he or she wants. When your baby unlatches or falls asleep while feeding from the first breast, offer the second breast. Because newborns are often sleepy in the first few weeks of life, you may need to awaken your baby to get him or her to feed. Breastfeeding times will vary from baby to baby. However, the following rules can serve as a guide to   help you make sure that your baby is properly fed:  Newborns (babies 4 weeks of age or younger) may breastfeed every 1-3 hours.  Newborns should not go without breastfeeding for longer than 3 hours during the day or 5 hours during the night.  You should breastfeed your baby a minimum of 8 times in a 24-hour period. Breast milk pumping     Pumping and storing breast milk allows you to make sure that your baby is exclusively fed your breast milk, even at times when you are unable to breastfeed. This is especially important if you go back to work while you are still breastfeeding, or if you are not able to be present during feedings. Your lactation consultant can help you find a method of pumping that works best for you and give you guidelines about how long it is safe to store breast milk. Caring for your breasts while you breastfeed Nipples can become dry, cracked, and sore while breastfeeding. The following recommendations can help keep your breasts moisturized and healthy:  Avoid using soap on your nipples.  Wear a supportive bra designed especially for nursing. Avoid wearing underwire-style bras or extremely tight bras (sports bras).  Air-dry your nipples for 3-4 minutes after each feeding.  Use only cotton bra pads to absorb leaked breast milk. Leaking of breast milk between feedings is normal.  Use lanolin on your nipples after breastfeeding. Lanolin helps to maintain your skin's normal moisture barrier. Pure lanolin is not harmful (not  toxic) to your baby. You may also hand express a few drops of breast milk and gently massage that milk into your nipples and allow the milk to air-dry. In the first few weeks after giving birth, some women experience breast engorgement. Engorgement can make your breasts feel heavy, warm, and tender to the touch. Engorgement peaks within 3-5 days after you give birth. The following recommendations can help to ease engorgement:  Completely empty your breasts while breastfeeding or pumping. You may want to start by applying warm, moist heat (in the shower or with warm, water-soaked hand towels) just before feeding or pumping. This increases circulation and helps the milk flow. If your baby does not completely empty your breasts while breastfeeding, pump any extra milk after he or she is finished.  Apply ice packs to your breasts immediately after breastfeeding or pumping, unless this is too uncomfortable for you. To do this: ? Put ice in a plastic bag. ? Place a towel between your skin and the bag. ? Leave the ice on for 20 minutes, 2-3 times a day.  Make sure that your baby is latched on and positioned properly while breastfeeding. If engorgement persists after 48 hours of following these recommendations, contact your health care provider or a lactation consultant. Overall health care recommendations while breastfeeding  Eat 3 healthy meals and 3 snacks every day. Well-nourished mothers who are breastfeeding need an additional 450-500 calories a day. You can meet this requirement by increasing the amount of a balanced diet that you eat.  Drink enough water to keep your urine pale yellow or clear.  Rest often, relax, and continue to take your prenatal vitamins to prevent fatigue, stress, and low vitamin and mineral levels in your body (nutrient deficiencies).  Do not use any products that contain nicotine or tobacco, such as cigarettes and e-cigarettes. Your baby may be harmed by chemicals from  cigarettes that pass into breast milk and exposure to secondhand smoke. If you need help quitting, ask your   health care provider.  Avoid alcohol.  Do not use illegal drugs or marijuana.  Talk with your health care provider before taking any medicines. These include over-the-counter and prescription medicines as well as vitamins and herbal supplements. Some medicines that may be harmful to your baby can pass through breast milk.  It is possible to become pregnant while breastfeeding. If birth control is desired, ask your health care provider about options that will be safe while breastfeeding your baby. Where to find more information: [employer]: www.llli.org Contact a health care provider if:  You feel like you want to stop breastfeeding or have become frustrated with breastfeeding.  Your nipples are cracked or bleeding.  Your breasts are red, tender, or warm.  You have: ? Painful breasts or nipples. ? A swollen area on either breast. ? A fever or chills. ? Nausea or vomiting. ? Drainage other than breast milk from your nipples.  Your breasts do not become full before feedings by the fifth day after you give birth.  You feel sad and depressed.  Your baby is: ? Too sleepy to eat well. ? Having trouble sleeping. ? More than 1 week old and wetting fewer than 6 diapers in a 24-hour period. ? Not gaining weight by 5 days of age.  Your baby has fewer than 3 stools in a 24-hour period.  Your baby's skin or the white parts of his or her eyes become yellow. Get help right away if:  Your baby is overly tired (lethargic) and does not want to wake up and feed.  Your baby develops an unexplained fever. Summary  Breastfeeding offers many health benefits for infant and mothers.  Try to breastfeed your infant when he or she shows early signs of hunger.  Gently tickle or stroke your baby's lips with your finger or nipple to allow the baby to open his or her mouth.  Bring the baby to your breast. Make sure that much of the areola is in your baby's mouth. Offer one side and burp the baby before you offer the other side.  Talk with your health care provider or lactation consultant if you have questions or you face problems as you breastfeed. This information is not intended to replace advice given to you by your health care provider. Make sure you discuss any questions you have with your health care provider. Document Revised: 02/13/2018 Document Reviewed: 12/21/2016 Elsevier Patient Education  2020 [employer]  

## 2020-12-09 ENCOUNTER — Encounter: Payer: Medicaid Other | Admitting: Obstetrics & Gynecology

## 2020-12-09 ENCOUNTER — Ambulatory Visit: Payer: Medicaid Other | Admitting: *Deleted

## 2020-12-09 ENCOUNTER — Other Ambulatory Visit: Payer: Self-pay

## 2020-12-09 ENCOUNTER — Other Ambulatory Visit: Payer: Self-pay | Admitting: *Deleted

## 2020-12-09 ENCOUNTER — Ambulatory Visit: Payer: Medicaid Other | Attending: Family Medicine

## 2020-12-09 ENCOUNTER — Encounter: Payer: Self-pay | Admitting: *Deleted

## 2020-12-09 DIAGNOSIS — O99321 Drug use complicating pregnancy, first trimester: Secondary | ICD-10-CM | POA: Insufficient documentation

## 2020-12-09 DIAGNOSIS — Z3480 Encounter for supervision of other normal pregnancy, unspecified trimester: Secondary | ICD-10-CM | POA: Diagnosis not present

## 2020-12-09 DIAGNOSIS — Z283 Underimmunization status: Secondary | ICD-10-CM | POA: Diagnosis present

## 2020-12-09 DIAGNOSIS — F1290 Cannabis use, unspecified, uncomplicated: Secondary | ICD-10-CM | POA: Insufficient documentation

## 2020-12-09 DIAGNOSIS — F1410 Cocaine abuse, uncomplicated: Secondary | ICD-10-CM

## 2020-12-09 DIAGNOSIS — O99320 Drug use complicating pregnancy, unspecified trimester: Secondary | ICD-10-CM | POA: Insufficient documentation

## 2020-12-09 DIAGNOSIS — F1120 Opioid dependence, uncomplicated: Secondary | ICD-10-CM

## 2020-12-09 DIAGNOSIS — O09899 Supervision of other high risk pregnancies, unspecified trimester: Secondary | ICD-10-CM

## 2020-12-09 DIAGNOSIS — O99891 Other specified diseases and conditions complicating pregnancy: Secondary | ICD-10-CM | POA: Insufficient documentation

## 2020-12-09 DIAGNOSIS — O2341 Unspecified infection of urinary tract in pregnancy, first trimester: Secondary | ICD-10-CM

## 2020-12-09 DIAGNOSIS — Z03818 Encounter for observation for suspected exposure to other biological agents ruled out: Secondary | ICD-10-CM | POA: Diagnosis not present

## 2020-12-09 DIAGNOSIS — Z362 Encounter for other antenatal screening follow-up: Secondary | ICD-10-CM

## 2020-12-12 NOTE — BH Specialist Note (Signed)
[facility] via Telemedicine Visit  12/12/2020 [name] [id number]  Pt did not arrive to video visit and pt's father answered the phone at [phone] number; Message left for pt to call back [name] at [phone]; left MyChart message for patient.    Valetta Close Kaveon Blatz, LCSW

## 2020-12-14 LAB — BUPRENORPHINE + DRUG SCREEN, UR
6-Acetylmorphine: NEGATIVE ng/mL
Amphetamines: NEGATIVE ng/mL
Barbiturates: NEGATIVE ng/mL
Benzodiazepines: NEGATIVE ng/mL
Buprenorphine: POSITIVE ng/mL — AB
Carisoprodol: NEGATIVE ng/mL
Cocaine Metabolite: POSITIVE ng/mL — AB
Creatinine: 48.3 mg/dL (ref >=20)
Ethyl Glucuronide: NEGATIVE ng/mL
Fentanyl: NEGATIVE ng/mL
Gabapentin: NEGATIVE ug/mL
Marijuana MTB (THC): POSITIVE ng/mL — AB
Methadone: NEGATIVE ng/mL
Nitrites: NEGATIVE ug/mL (ref <200)
Opiates: NEGATIVE ng/mL
Oxycodone: POSITIVE ng/mL — AB
Phencyclidine (PCP): NEGATIVE ng/mL
Propoxyphene: NEGATIVE ng/mL
Tapentadol: NEGATIVE ng/mL
Tramadol: NEGATIVE ng/mL
Urine pH: 6.3 (ref 4.5–8.9)

## 2020-12-14 LAB — BENZOYLECGONINE, CONFIRMATION: Benzoylecgonine: 1744 ng/mL

## 2020-12-14 LAB — OXYCODONE CONFIRMATION
Oxycodone: NEGATIVE ng/mL
Oxymorphone: 164 ng/mL

## 2020-12-14 LAB — CARBOXY-THC NORMALIZED RATIO
Carboxy-THC: 301 ng/mL
THC/CR Ratio: 623 ng/mg Creat

## 2020-12-14 LAB — BUPRENORPHINE/NALOXONE CONFIRM
Buprenorphine/CR: 153.2 ng/mg Creat
Naloxone: 483 ng/ml
Norbup/Bup Ratio: 3.45 RATIO
Norbuprenorphine/CR: 528 ng/mg Creat

## 2020-12-20 ENCOUNTER — Other Ambulatory Visit: Payer: Self-pay | Admitting: Family Medicine

## 2020-12-21 ENCOUNTER — Other Ambulatory Visit: Payer: Self-pay

## 2020-12-21 ENCOUNTER — Ambulatory Visit (INDEPENDENT_AMBULATORY_CARE_PROVIDER_SITE_OTHER): Payer: Medicaid Other | Admitting: Family Medicine

## 2020-12-21 VITALS — BP 116/56 | HR 82 | Wt 186.0 lb

## 2020-12-21 DIAGNOSIS — O99891 Other specified diseases and conditions complicating pregnancy: Secondary | ICD-10-CM

## 2020-12-21 DIAGNOSIS — F1120 Opioid dependence, uncomplicated: Secondary | ICD-10-CM

## 2020-12-21 DIAGNOSIS — Z2839 Other underimmunization status: Secondary | ICD-10-CM

## 2020-12-21 DIAGNOSIS — F17200 Nicotine dependence, unspecified, uncomplicated: Secondary | ICD-10-CM

## 2020-12-21 DIAGNOSIS — F419 Anxiety disorder, unspecified: Secondary | ICD-10-CM

## 2020-12-21 DIAGNOSIS — O99320 Drug use complicating pregnancy, unspecified trimester: Secondary | ICD-10-CM

## 2020-12-21 DIAGNOSIS — Z283 Underimmunization status: Secondary | ICD-10-CM

## 2020-12-21 DIAGNOSIS — O0990 Supervision of high risk pregnancy, unspecified, unspecified trimester: Secondary | ICD-10-CM

## 2020-12-21 MED ORDER — BUPRENORPHINE HCL-NALOXONE HCL 4-1 MG SL FILM: 4.0000 mg | ORAL_FILM | SUBLINGUAL | 0 refills | Status: DC

## 2020-12-21 MED ORDER — ONDANSETRON 4 MG PO TBDP: 4.0000 mg | ORAL_TABLET | Freq: Three times a day (TID) | ORAL | 2 refills | Status: DC | PRN

## 2020-12-21 NOTE — Patient Instructions (Signed)
 Contraception Choices Contraception, also called birth control, refers to methods or devices that prevent pregnancy. Hormonal methods Contraceptive implant A contraceptive implant is a thin, plastic tube that contains a hormone that prevents pregnancy. It is different from an intrauterine device (IUD). It is inserted into the upper part of the arm by a health care provider. Implants can be effective for up to 3 years. Progestin-only injections Progestin-only injections are injections of progestin, a synthetic form of the hormone progesterone. They are given every 3 months by a health care provider. Birth control pills Birth control pills are pills that contain hormones that prevent pregnancy. They must be taken once a day, preferably at the same time each day. A prescription is needed to use this method of contraception. Birth control patch The birth control patch contains hormones that prevent pregnancy. It is placed on the skin and must be changed once a week for three weeks and removed on the fourth week. A prescription is needed to use this method of contraception. Vaginal ring A vaginal ring contains hormones that prevent pregnancy. It is placed in the vagina for three weeks and removed on the fourth week. After that, the process is repeated with a new ring. A prescription is needed to use this method of contraception. Emergency contraceptive Emergency contraceptives prevent pregnancy after unprotected sex. They come in pill form and can be taken up to 5 days after sex. They work best the sooner they are taken after having sex. Most emergency contraceptives are available without a prescription. This method should not be used as your only form of birth control.   Barrier methods Female condom A female condom is a thin sheath that is worn over the penis during sex. Condoms keep sperm from going inside a woman's body. They can be used with a sperm-killing substance (spermicide) to increase their  effectiveness. They should be thrown away after one use. Female condom A female condom is a soft, loose-fitting sheath that is put into the vagina before sex. The condom keeps sperm from going inside a woman's body. They should be thrown away after one use. Diaphragm A diaphragm is a soft, dome-shaped barrier. It is inserted into the vagina before sex, along with a spermicide. The diaphragm blocks sperm from entering the uterus, and the spermicide kills sperm. A diaphragm should be left in the vagina for 6-8 hours after sex and removed within 24 hours. A diaphragm is prescribed and fitted by a health care provider. A diaphragm should be replaced every 1-2 years, after giving birth, after gaining more than 15 lb (6.8 kg), and after pelvic surgery. Cervical cap A cervical cap is a round, soft latex or plastic cup that fits over the cervix. It is inserted into the vagina before sex, along with spermicide. It blocks sperm from entering the uterus. The cap should be left in place for 6-8 hours after sex and removed within 48 hours. A cervical cap must be prescribed and fitted by a health care provider. It should be replaced every 2 years. Sponge A sponge is a soft, circular piece of polyurethane foam with spermicide in it. The sponge helps block sperm from entering the uterus, and the spermicide kills sperm. To use it, you make it wet and then insert it into the vagina. It should be inserted before sex, left in for at least 6 hours after sex, and removed and thrown away within 30 hours. Spermicides Spermicides are chemicals that kill or block sperm from entering the   cervix and uterus. They can come as a cream, jelly, suppository, foam, or tablet. A spermicide should be inserted into the vagina with an applicator at least 10-15 minutes before sex to allow time for it to work. The process must be repeated every time you have sex. Spermicides do not require a prescription.   Intrauterine  contraception Intrauterine device (IUD) An IUD is a T-shaped device that is put in a woman's uterus. There are two types:  Hormone IUD.This type contains progestin, a synthetic form of the hormone progesterone. This type can stay in place for 3-5 years.  Copper IUD.This type is wrapped in copper wire. It can stay in place for 10 years. Permanent methods of contraception Female tubal ligation In this method, a woman's fallopian tubes are sealed, tied, or blocked during surgery to prevent eggs from traveling to the uterus. Hysteroscopic sterilization In this method, a small, flexible insert is placed into each fallopian tube. The inserts cause scar tissue to form in the fallopian tubes and block them, so sperm cannot reach an egg. The procedure takes about 3 months to be effective. Another form of birth control must be used during those 3 months. Female sterilization This is a procedure to tie off the tubes that carry sperm (vasectomy). After the procedure, the man can still ejaculate fluid (semen). Another form of birth control must be used for 3 months after the procedure. Natural planning methods Natural family planning In this method, a couple does not have sex on days when the woman could become pregnant. Calendar method In this method, the woman keeps track of the length of each menstrual cycle, identifies the days when pregnancy can happen, and does not have sex on those days. Ovulation method In this method, a couple avoids sex during ovulation. Symptothermal method This method involves not having sex during ovulation. The woman typically checks for ovulation by watching changes in her temperature and in the consistency of cervical mucus. Post-ovulation method In this method, a couple waits to have sex until after ovulation. Where to find more information  Centers for Disease Control and Prevention: www.cdc.gov Summary  Contraception, also called birth control, refers to methods or  devices that prevent pregnancy.  Hormonal methods of contraception include implants, injections, pills, patches, vaginal rings, and emergency contraceptives.  Barrier methods of contraception can include female condoms, female condoms, diaphragms, cervical caps, sponges, and spermicides.  There are two types of IUDs (intrauterine devices). An IUD can be put in a woman's uterus to prevent pregnancy for 3-5 years.  Permanent sterilization can be done through a procedure for males and females. Natural family planning methods involve nothaving sex on days when the woman could become pregnant. This information is not intended to replace advice given to you by your health care provider. Make sure you discuss any questions you have with your health care provider. Document Revised: 04/25/2020 Document Reviewed: 04/25/2020 Elsevier Patient Education  2021 Elsevier Inc.   Breastfeeding  Choosing to breastfeed is one of the best decisions you can make for yourself and your baby. A change in hormones during pregnancy causes your breasts to make breast milk in your milk-producing glands. Hormones prevent breast milk from being released before your baby is born. They also prompt milk flow after birth. Once breastfeeding has begun, thoughts of your baby, as well as his or her sucking or crying, can stimulate the release of milk from your milk-producing glands. Benefits of breastfeeding Research shows that breastfeeding offers many health benefits   for infants and mothers. It also offers a cost-free and convenient way to feed your baby. For your baby  Your first milk (colostrum) helps your baby's digestive system to function better.  Special cells in your milk (antibodies) help your baby to fight off infections.  Breastfed babies are less likely to develop asthma, allergies, obesity, or type 2 diabetes. They are also at lower risk for sudden infant death syndrome (SIDS).  Nutrients in breast milk are better  able to meet your baby's needs compared to infant formula.  Breast milk improves your baby's brain development. For you  Breastfeeding helps to create a very special bond between you and your baby.  Breastfeeding is convenient. Breast milk costs nothing and is always available at the correct temperature.  Breastfeeding helps to burn calories. It helps you to lose the weight that you gained during pregnancy.  Breastfeeding makes your uterus return faster to its size before pregnancy. It also slows bleeding (lochia) after you give birth.  Breastfeeding helps to lower your risk of developing type 2 diabetes, osteoporosis, rheumatoid arthritis, cardiovascular disease, and breast, ovarian, uterine, and endometrial cancer later in life. Breastfeeding basics Starting breastfeeding  Find a comfortable place to sit or lie down, with your neck and back well-supported.  Place a pillow or a rolled-up blanket under your baby to bring him or her to the level of your breast (if you are seated). Nursing pillows are specially designed to help support your arms and your baby while you breastfeed.  Make sure that your baby's tummy (abdomen) is facing your abdomen.  Gently massage your breast. With your fingertips, massage from the outer edges of your breast inward toward the nipple. This encourages milk flow. If your milk flows slowly, you may need to continue this action during the feeding.  Support your breast with 4 fingers underneath and your thumb above your nipple (make the letter "C" with your hand). Make sure your fingers are well away from your nipple and your baby's mouth.  Stroke your baby's lips gently with your finger or nipple.  When your baby's mouth is open wide enough, quickly bring your baby to your breast, placing your entire nipple and as much of the areola as possible into your baby's mouth. The areola is the colored area around your nipple. ? More areola should be visible above your  baby's upper lip than below the lower lip. ? Your baby's lips should be opened and extended outward (flanged) to ensure an adequate, comfortable latch. ? Your baby's tongue should be between his or her lower gum and your breast.  Make sure that your baby's mouth is correctly positioned around your nipple (latched). Your baby's lips should create a seal on your breast and be turned out (everted).  It is common for your baby to suck about 2-3 minutes in order to start the flow of breast milk. Latching Teaching your baby how to latch onto your breast properly is very important. An improper latch can cause nipple pain, decreased milk supply, and poor weight gain in your baby. Also, if your baby is not latched onto your nipple properly, he or she may swallow some air during feeding. This can make your baby fussy. Burping your baby when you switch breasts during the feeding can help to get rid of the air. However, teaching your baby to latch on properly is still the best way to prevent fussiness from swallowing air while breastfeeding. Signs that your baby has successfully latched onto   your nipple  Silent tugging or silent sucking, without causing you pain. Infant's lips should be extended outward (flanged).  Swallowing heard between every 3-4 sucks once your milk has started to flow (after your let-down milk reflex occurs).  Muscle movement above and in front of his or her ears while sucking. Signs that your baby has not successfully latched onto your nipple  Sucking sounds or smacking sounds from your baby while breastfeeding.  Nipple pain. If you think your baby has not latched on correctly, slip your finger into the corner of your baby's mouth to break the suction and place it between your baby's gums. Attempt to start breastfeeding again. Signs of successful breastfeeding Signs from your baby  Your baby will gradually decrease the number of sucks or will completely stop sucking.  Your baby  will fall asleep.  Your baby's body will relax.  Your baby will retain a small amount of milk in his or her mouth.  Your baby will let go of your breast by himself or herself. Signs from you  Breasts that have increased in firmness, weight, and size 1-3 hours after feeding.  Breasts that are softer immediately after breastfeeding.  Increased milk volume, as well as a change in milk consistency and color by the fifth day of breastfeeding.  Nipples that are not sore, cracked, or bleeding. Signs that your baby is getting enough milk  Wetting at least 1-2 diapers during the first 24 hours after birth.  Wetting at least 5-6 diapers every 24 hours for the first week after birth. The urine should be clear or pale yellow by the age of 5 days.  Wetting 6-8 diapers every 24 hours as your baby continues to grow and develop.  At least 3 stools in a 24-hour period by the age of 5 days. The stool should be soft and yellow.  At least 3 stools in a 24-hour period by the age of 7 days. The stool should be seedy and yellow.  No loss of weight greater than 10% of birth weight during the first 3 days of life.  Average weight gain of 4-7 oz (113-198 g) per week after the age of 4 days.  Consistent daily weight gain by the age of 5 days, without weight loss after the age of 2 weeks. After a feeding, your baby may spit up a small amount of milk. This is normal. Breastfeeding frequency and duration Frequent feeding will help you make more milk and can prevent sore nipples and extremely full breasts (breast engorgement). Breastfeed when you feel the need to reduce the fullness of your breasts or when your baby shows signs of hunger. This is called "breastfeeding on demand." Signs that your baby is hungry include:  Increased alertness, activity, or restlessness.  Movement of the head from side to side.  Opening of the mouth when the corner of the mouth or cheek is stroked (rooting).  Increased  sucking sounds, smacking lips, cooing, sighing, or squeaking.  Hand-to-mouth movements and sucking on fingers or hands.  Fussing or crying. Avoid introducing a pacifier to your baby in the first 4-6 weeks after your baby is born. After this time, you may choose to use a pacifier. Research has shown that pacifier use during the first year of a baby's life decreases the risk of sudden infant death syndrome (SIDS). Allow your baby to feed on each breast as long as he or she wants. When your baby unlatches or falls asleep while feeding from the   first breast, offer the second breast. Because newborns are often sleepy in the first few weeks of life, you may need to awaken your baby to get him or her to feed. Breastfeeding times will vary from baby to baby. However, the following rules can serve as a guide to help you make sure that your baby is properly fed:  Newborns (babies 4 weeks of age or younger) may breastfeed every 1-3 hours.  Newborns should not go without breastfeeding for longer than 3 hours during the day or 5 hours during the night.  You should breastfeed your baby a minimum of 8 times in a 24-hour period. Breast milk pumping Pumping and storing breast milk allows you to make sure that your baby is exclusively fed your breast milk, even at times when you are unable to breastfeed. This is especially important if you go back to work while you are still breastfeeding, or if you are not able to be present during feedings. Your lactation consultant can help you find a method of pumping that works best for you and give you guidelines about how long it is safe to store breast milk.      Caring for your breasts while you breastfeed Nipples can become dry, cracked, and sore while breastfeeding. The following recommendations can help keep your breasts moisturized and healthy:  Avoid using soap on your nipples.  Wear a supportive bra designed especially for nursing. Avoid wearing underwire-style  bras or extremely tight bras (sports bras).  Air-dry your nipples for 3-4 minutes after each feeding.  Use only cotton bra pads to absorb leaked breast milk. Leaking of breast milk between feedings is normal.  Use lanolin on your nipples after breastfeeding. Lanolin helps to maintain your skin's normal moisture barrier. Pure lanolin is not harmful (not toxic) to your baby. You may also hand express a few drops of breast milk and gently massage that milk into your nipples and allow the milk to air-dry. In the first few weeks after giving birth, some women experience breast engorgement. Engorgement can make your breasts feel heavy, warm, and tender to the touch. Engorgement peaks within 3-5 days after you give birth. The following recommendations can help to ease engorgement:  Completely empty your breasts while breastfeeding or pumping. You may want to start by applying warm, moist heat (in the shower or with warm, water-soaked hand towels) just before feeding or pumping. This increases circulation and helps the milk flow. If your baby does not completely empty your breasts while breastfeeding, pump any extra milk after he or she is finished.  Apply ice packs to your breasts immediately after breastfeeding or pumping, unless this is too uncomfortable for you. To do this: ? Put ice in a plastic bag. ? Place a towel between your skin and the bag. ? Leave the ice on for 20 minutes, 2-3 times a day.  Make sure that your baby is latched on and positioned properly while breastfeeding. If engorgement persists after 48 hours of following these recommendations, contact your health care provider or a lactation consultant. Overall health care recommendations while breastfeeding  Eat 3 healthy meals and 3 snacks every day. Well-nourished mothers who are breastfeeding need an additional 450-500 calories a day. You can meet this requirement by increasing the amount of a balanced diet that you eat.  Drink  enough water to keep your urine pale yellow or clear.  Rest often, relax, and continue to take your prenatal vitamins to prevent fatigue, stress, and low   vitamin and mineral levels in your body (nutrient deficiencies).  Do not use any products that contain nicotine or tobacco, such as cigarettes and e-cigarettes. Your baby may be harmed by chemicals from cigarettes that pass into breast milk and exposure to secondhand smoke. If you need help quitting, ask your health care provider.  Avoid alcohol.  Do not use illegal drugs or marijuana.  Talk with your health care provider before taking any medicines. These include over-the-counter and prescription medicines as well as vitamins and herbal supplements. Some medicines that may be harmful to your baby can pass through breast milk.  It is possible to become pregnant while breastfeeding. If birth control is desired, ask your health care provider about options that will be safe while breastfeeding your baby. Where to find more information: [employer]: www.llli.org Contact a health care provider if:  You feel like you want to stop breastfeeding or have become frustrated with breastfeeding.  Your nipples are cracked or bleeding.  Your breasts are red, tender, or warm.  You have: ? Painful breasts or nipples. ? A swollen area on either breast. ? A fever or chills. ? Nausea or vomiting. ? Drainage other than breast milk from your nipples.  Your breasts do not become full before feedings by the fifth day after you give birth.  You feel sad and depressed.  Your baby is: ? Too sleepy to eat well. ? Having trouble sleeping. ? More than 1 week old and wetting fewer than 6 diapers in a 24-hour period. ? Not gaining weight by 5 days of age.  Your baby has fewer than 3 stools in a 24-hour period.  Your baby's skin or the white parts of his or her eyes become yellow. Get help right away if:  Your baby is overly tired  (lethargic) and does not want to wake up and feed.  Your baby develops an unexplained fever. Summary  Breastfeeding offers many health benefits for infant and mothers.  Try to breastfeed your infant when he or she shows early signs of hunger.  Gently tickle or stroke your baby's lips with your finger or nipple to allow the baby to open his or her mouth. Bring the baby to your breast. Make sure that much of the areola is in your baby's mouth. Offer one side and burp the baby before you offer the other side.  Talk with your health care provider or lactation consultant if you have questions or you face problems as you breastfeed. This information is not intended to replace advice given to you by your health care provider. Make sure you discuss any questions you have with your health care provider. Document Revised: 02/13/2018 Document Reviewed: 12/21/2016 Elsevier Patient Education  2021 Elsevier Inc.  

## 2020-12-21 NOTE — Progress Notes (Signed)
   Subjective:  Monica Vazquez is a 25 y.o. G2P0010 at 5w6dbeing seen today for ongoing prenatal care.  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  Patient reports no complaints.  Contractions: Not present. Vag. Bleeding: None.  Movement: Present. Denies leaking of fluid.   The following portions of the patient's history were reviewed and updated as appropriate: allergies, current medications, past family history, past medical history, past social history, past surgical history and problem list. Problem list updated.  Objective:   Vitals:   12/21/20 1019  BP: (!) 116/56  Pulse: 82  Weight: 186 lb (84.4 kg)    Fetal Status: Fetal Heart Rate (bpm): 147   Movement: Present     General:  Alert, oriented and cooperative. Patient is in no acute distress.  Skin: Skin is warm and dry. No rash noted.   Cardiovascular: Normal heart rate noted  Respiratory: Normal respiratory effort, no problems with respiration noted  Abdomen: Soft, gravid, appropriate for gestational age. Pain/Pressure: Absent     Pelvic: Vag. Bleeding: None     Cervical exam deferred        Extremities: Normal range of motion.  Edema: None  Mental Status: Normal mood and affect. Normal behavior. Normal judgment and thought content.   Urinalysis:      Assessment and Plan:  Pregnancy: G2P0010 at 21w6d1. 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 Discussed circumcision at length, planning to do so for now, understands procedure is cosmetic/religious and not medically indicated  2. Suboxone maintenance treatment complicating pregnancy, antepartum ([place], doing well on 8/4 Discussed recent UDS, acknowledged use at last visit of oxycodone but we are both surprised at +cocaine Patient denies any knowledge of use, has been using MJ Discussed that sometimes MJ can be laced with cocaine, she agrees to refrain until next visit and we will repeat UDS at that time Discussed implications for CPS and post-delivery dispo of infant, understands that maintaining reliable UDS with only buprenorphine will be helpful after delivery  3. Smoker   4. Rubella non-immune status, antepartum MMR PP  5. Anxiety Mood good today  Preterm labor symptoms and general obstetric precautions including but not limited to vaginal bleeding, contractions, leaking of fluid and fetal movement were reviewed in detail with the patient. Please refer to After Visit Summary for other counseling recommendations.  Return in 2 weeks (on 01/04/2021) [facility] needs MD.   EcClarnce FlockMD

## 2020-12-23 ENCOUNTER — Ambulatory Visit: Payer: HRSA Program | Admitting: Clinical

## 2020-12-23 DIAGNOSIS — Z03818 Encounter for observation for suspected exposure to other biological agents ruled out: Secondary | ICD-10-CM | POA: Diagnosis not present

## 2020-12-23 DIAGNOSIS — Z91199 Patient's noncompliance with other medical treatment and regimen due to unspecified reason: Secondary | ICD-10-CM

## 2020-12-27 NOTE — BH Specialist Note (Signed)
Integrated Behavioral Health via Telemedicine Visit  12/27/2020 [name] [id number]  Pt did not arrive to video visit and did not answer the phone ; Left HIPPA-compliant message to call back [name] from [facility] [employer] [facility] at [phone] (main office) or [phone] (Ala Kratz's office).  ; left MyChart message for patient.

## 2021-01-02 ENCOUNTER — Other Ambulatory Visit: Payer: Self-pay

## 2021-01-02 ENCOUNTER — Ambulatory Visit: Payer: Medicaid Other | Admitting: Clinical

## 2021-01-02 DIAGNOSIS — Z91199 Patient's noncompliance with other medical treatment and regimen due to unspecified reason: Secondary | ICD-10-CM

## 2021-01-02 DIAGNOSIS — Z5329 Procedure and treatment not carried out because of patient's decision for other reasons: Secondary | ICD-10-CM

## 2021-01-03 ENCOUNTER — Other Ambulatory Visit: Payer: Self-pay

## 2021-01-03 ENCOUNTER — Ambulatory Visit (INDEPENDENT_AMBULATORY_CARE_PROVIDER_SITE_OTHER): Payer: Medicaid Other | Admitting: Family Medicine

## 2021-01-03 VITALS — BP 110/81 | HR 81 | Wt 191.2 lb

## 2021-01-03 DIAGNOSIS — F17200 Nicotine dependence, unspecified, uncomplicated: Secondary | ICD-10-CM

## 2021-01-03 DIAGNOSIS — O0990 Supervision of high risk pregnancy, unspecified, unspecified trimester: Secondary | ICD-10-CM

## 2021-01-03 DIAGNOSIS — O99320 Drug use complicating pregnancy, unspecified trimester: Secondary | ICD-10-CM

## 2021-01-03 DIAGNOSIS — O99891 Other specified diseases and conditions complicating pregnancy: Secondary | ICD-10-CM

## 2021-01-03 DIAGNOSIS — Z2839 Other underimmunization status: Secondary | ICD-10-CM

## 2021-01-03 DIAGNOSIS — F1120 Opioid dependence, uncomplicated: Secondary | ICD-10-CM

## 2021-01-03 DIAGNOSIS — Z283 Underimmunization status: Secondary | ICD-10-CM

## 2021-01-03 DIAGNOSIS — F419 Anxiety disorder, unspecified: Secondary | ICD-10-CM

## 2021-01-03 MED ORDER — ONDANSETRON 4 MG PO TBDP
4.0000 mg | ORAL_TABLET | Freq: Three times a day (TID) | ORAL | 2 refills | Status: DC | PRN
Start: 2021-01-03 — End: 2021-03-09

## 2021-01-03 MED ORDER — BUPRENORPHINE HCL-NALOXONE HCL 8-2 MG SL FILM
8.0000 mg | ORAL_FILM | Freq: Two times a day (BID) | SUBLINGUAL | 0 refills | Status: DC
Start: 2021-01-03 — End: 2021-01-17

## 2021-01-03 NOTE — Progress Notes (Signed)
   Subjective:  Monica Vazquez is a 25 y.o. G2P0010 at [redacted]w[redacted]d being seen today for ongoing prenatal care.  She is currently monitored for the following issues for this high-risk pregnancy and has Supervision of high risk pregnancy, antepartum; Suboxone maintenance treatment complicating pregnancy, antepartum (HCC); Smoker; Anxiety; Maternal UTI (urinary tract infection), first trimester; Rubella non-immune status, antepartum; Cocaine abuse affecting pregnancy in first trimester (HCC); Marijuana use; and Abn chromsoml and genetic find on antenat screen of mother on their problem list.  Patient reports no complaints.  Contractions: Not present. Vag. Bleeding: None.  Movement: Present. Denies leaking of fluid.   The following portions of the patient's history were reviewed and updated as appropriate: allergies, current medications, past family history, past medical history, past social history, past surgical history and problem list. Problem list updated.  Objective:   Vitals:   01/03/21 1358  BP: 110/81  Pulse: 81  Weight: 191 lb 3.2 oz (86.7 kg)    Fetal Status: Fetal Heart Rate (bpm): 149   Movement: Present     General:  Alert, oriented and cooperative. Patient is in no acute distress.  Skin: Skin is warm and dry. No rash noted.   Cardiovascular: Normal heart rate noted  Respiratory: Normal respiratory effort, no problems with respiration noted  Abdomen: Soft, gravid, appropriate for gestational age. Pain/Pressure: Absent     Pelvic: Vag. Bleeding: None Vag D/C Character: White   Cervical exam deferred        Extremities: Normal range of motion.  Edema: Trace  Mental Status: Normal mood and affect. Normal behavior. Normal judgment and thought content.   Urinalysis:      Assessment and Plan:  Pregnancy: G2P0010 at [redacted]w[redacted]d  1. Supervision of high risk pregnancy, antepartum BP and FHR normal  2. Suboxone maintenance treatment complicating pregnancy, antepartum (HCC) Feeling some  increased symptoms at night, has sometimes been taking half a strip early at night Feels hot and cold withdrawal symptoms Will increase from 8/4 to 8 BID, new rx sent Send UDS today, reports she is still using MJ for appetite, encouraged to stop and reminded her this may produce unexpected results on UDS  3. Rubella non-immune status, antepartum   4. Anxiety Mood stable  5. Smoker   Preterm labor symptoms and general obstetric precautions including but not limited to vaginal bleeding, contractions, leaking of fluid and fetal movement were reviewed in detail with the patient. Please refer to After Visit Summary for other counseling recommendations.  Return in 2 weeks (on 01/17/2021) for [facility], ob visit, needs MD.   Venora Maples, MD

## 2021-01-03 NOTE — Patient Instructions (Signed)
 Contraception Choices Contraception, also called birth control, refers to methods or devices that prevent pregnancy. Hormonal methods Contraceptive implant A contraceptive implant is a thin, plastic tube that contains a hormone that prevents pregnancy. It is different from an intrauterine device (IUD). It is inserted into the upper part of the arm by a health care provider. Implants can be effective for up to 3 years. Progestin-only injections Progestin-only injections are injections of progestin, a synthetic form of the hormone progesterone. They are given every 3 months by a health care provider. Birth control pills Birth control pills are pills that contain hormones that prevent pregnancy. They must be taken once a day, preferably at the same time each day. A prescription is needed to use this method of contraception. Birth control patch The birth control patch contains hormones that prevent pregnancy. It is placed on the skin and must be changed once a week for three weeks and removed on the fourth week. A prescription is needed to use this method of contraception. Vaginal ring A vaginal ring contains hormones that prevent pregnancy. It is placed in the vagina for three weeks and removed on the fourth week. After that, the process is repeated with a new ring. A prescription is needed to use this method of contraception. Emergency contraceptive Emergency contraceptives prevent pregnancy after unprotected sex. They come in pill form and can be taken up to 5 days after sex. They work best the sooner they are taken after having sex. Most emergency contraceptives are available without a prescription. This method should not be used as your only form of birth control.   Barrier methods Female condom A female condom is a thin sheath that is worn over the penis during sex. Condoms keep sperm from going inside a woman's body. They can be used with a sperm-killing substance (spermicide) to increase their  effectiveness. They should be thrown away after one use. Female condom A female condom is a soft, loose-fitting sheath that is put into the vagina before sex. The condom keeps sperm from going inside a woman's body. They should be thrown away after one use. Diaphragm A diaphragm is a soft, dome-shaped barrier. It is inserted into the vagina before sex, along with a spermicide. The diaphragm blocks sperm from entering the uterus, and the spermicide kills sperm. A diaphragm should be left in the vagina for 6-8 hours after sex and removed within 24 hours. A diaphragm is prescribed and fitted by a health care provider. A diaphragm should be replaced every 1-2 years, after giving birth, after gaining more than 15 lb (6.8 kg), and after pelvic surgery. Cervical cap A cervical cap is a round, soft latex or plastic cup that fits over the cervix. It is inserted into the vagina before sex, along with spermicide. It blocks sperm from entering the uterus. The cap should be left in place for 6-8 hours after sex and removed within 48 hours. A cervical cap must be prescribed and fitted by a health care provider. It should be replaced every 2 years. Sponge A sponge is a soft, circular piece of polyurethane foam with spermicide in it. The sponge helps block sperm from entering the uterus, and the spermicide kills sperm. To use it, you make it wet and then insert it into the vagina. It should be inserted before sex, left in for at least 6 hours after sex, and removed and thrown away within 30 hours. Spermicides Spermicides are chemicals that kill or block sperm from entering the   cervix and uterus. They can come as a cream, jelly, suppository, foam, or tablet. A spermicide should be inserted into the vagina with an applicator at least 10-15 minutes before sex to allow time for it to work. The process must be repeated every time you have sex. Spermicides do not require a prescription.   Intrauterine  contraception Intrauterine device (IUD) An IUD is a T-shaped device that is put in a woman's uterus. There are two types:  Hormone IUD.This type contains progestin, a synthetic form of the hormone progesterone. This type can stay in place for 3-5 years.  Copper IUD.This type is wrapped in copper wire. It can stay in place for 10 years. Permanent methods of contraception Female tubal ligation In this method, a woman's fallopian tubes are sealed, tied, or blocked during surgery to prevent eggs from traveling to the uterus. Hysteroscopic sterilization In this method, a small, flexible insert is placed into each fallopian tube. The inserts cause scar tissue to form in the fallopian tubes and block them, so sperm cannot reach an egg. The procedure takes about 3 months to be effective. Another form of birth control must be used during those 3 months. Female sterilization This is a procedure to tie off the tubes that carry sperm (vasectomy). After the procedure, the man can still ejaculate fluid (semen). Another form of birth control must be used for 3 months after the procedure. Natural planning methods Natural family planning In this method, a couple does not have sex on days when the woman could become pregnant. Calendar method In this method, the woman keeps track of the length of each menstrual cycle, identifies the days when pregnancy can happen, and does not have sex on those days. Ovulation method In this method, a couple avoids sex during ovulation. Symptothermal method This method involves not having sex during ovulation. The woman typically checks for ovulation by watching changes in her temperature and in the consistency of cervical mucus. Post-ovulation method In this method, a couple waits to have sex until after ovulation. Where to find more information  Centers for Disease Control and Prevention: www.cdc.gov Summary  Contraception, also called birth control, refers to methods or  devices that prevent pregnancy.  Hormonal methods of contraception include implants, injections, pills, patches, vaginal rings, and emergency contraceptives.  Barrier methods of contraception can include female condoms, female condoms, diaphragms, cervical caps, sponges, and spermicides.  There are two types of IUDs (intrauterine devices). An IUD can be put in a woman's uterus to prevent pregnancy for 3-5 years.  Permanent sterilization can be done through a procedure for males and females. Natural family planning methods involve nothaving sex on days when the woman could become pregnant. This information is not intended to replace advice given to you by your health care provider. Make sure you discuss any questions you have with your health care provider. Document Revised: 04/25/2020 Document Reviewed: 04/25/2020 Elsevier Patient Education  2021 Elsevier Inc.   Breastfeeding  Choosing to breastfeed is one of the best decisions you can make for yourself and your baby. A change in hormones during pregnancy causes your breasts to make breast milk in your milk-producing glands. Hormones prevent breast milk from being released before your baby is born. They also prompt milk flow after birth. Once breastfeeding has begun, thoughts of your baby, as well as his or her sucking or crying, can stimulate the release of milk from your milk-producing glands. Benefits of breastfeeding Research shows that breastfeeding offers many health benefits   for infants and mothers. It also offers a cost-free and convenient way to feed your baby. For your baby  Your first milk (colostrum) helps your baby's digestive system to function better.  Special cells in your milk (antibodies) help your baby to fight off infections.  Breastfed babies are less likely to develop asthma, allergies, obesity, or type 2 diabetes. They are also at lower risk for sudden infant death syndrome (SIDS).  Nutrients in breast milk are better  able to meet your baby's needs compared to infant formula.  Breast milk improves your baby's brain development. For you  Breastfeeding helps to create a very special bond between you and your baby.  Breastfeeding is convenient. Breast milk costs nothing and is always available at the correct temperature.  Breastfeeding helps to burn calories. It helps you to lose the weight that you gained during pregnancy.  Breastfeeding makes your uterus return faster to its size before pregnancy. It also slows bleeding (lochia) after you give birth.  Breastfeeding helps to lower your risk of developing type 2 diabetes, osteoporosis, rheumatoid arthritis, cardiovascular disease, and breast, ovarian, uterine, and endometrial cancer later in life. Breastfeeding basics Starting breastfeeding  Find a comfortable place to sit or lie down, with your neck and back well-supported.  Place a pillow or a rolled-up blanket under your baby to bring him or her to the level of your breast (if you are seated). Nursing pillows are specially designed to help support your arms and your baby while you breastfeed.  Make sure that your baby's tummy (abdomen) is facing your abdomen.  Gently massage your breast. With your fingertips, massage from the outer edges of your breast inward toward the nipple. This encourages milk flow. If your milk flows slowly, you may need to continue this action during the feeding.  Support your breast with 4 fingers underneath and your thumb above your nipple (make the letter "C" with your hand). Make sure your fingers are well away from your nipple and your baby's mouth.  Stroke your baby's lips gently with your finger or nipple.  When your baby's mouth is open wide enough, quickly bring your baby to your breast, placing your entire nipple and as much of the areola as possible into your baby's mouth. The areola is the colored area around your nipple. ? More areola should be visible above your  baby's upper lip than below the lower lip. ? Your baby's lips should be opened and extended outward (flanged) to ensure an adequate, comfortable latch. ? Your baby's tongue should be between his or her lower gum and your breast.  Make sure that your baby's mouth is correctly positioned around your nipple (latched). Your baby's lips should create a seal on your breast and be turned out (everted).  It is common for your baby to suck about 2-3 minutes in order to start the flow of breast milk. Latching Teaching your baby how to latch onto your breast properly is very important. An improper latch can cause nipple pain, decreased milk supply, and poor weight gain in your baby. Also, if your baby is not latched onto your nipple properly, he or she may swallow some air during feeding. This can make your baby fussy. Burping your baby when you switch breasts during the feeding can help to get rid of the air. However, teaching your baby to latch on properly is still the best way to prevent fussiness from swallowing air while breastfeeding. Signs that your baby has successfully latched onto   your nipple  Silent tugging or silent sucking, without causing you pain. Infant's lips should be extended outward (flanged).  Swallowing heard between every 3-4 sucks once your milk has started to flow (after your let-down milk reflex occurs).  Muscle movement above and in front of his or her ears while sucking. Signs that your baby has not successfully latched onto your nipple  Sucking sounds or smacking sounds from your baby while breastfeeding.  Nipple pain. If you think your baby has not latched on correctly, slip your finger into the corner of your baby's mouth to break the suction and place it between your baby's gums. Attempt to start breastfeeding again. Signs of successful breastfeeding Signs from your baby  Your baby will gradually decrease the number of sucks or will completely stop sucking.  Your baby  will fall asleep.  Your baby's body will relax.  Your baby will retain a small amount of milk in his or her mouth.  Your baby will let go of your breast by himself or herself. Signs from you  Breasts that have increased in firmness, weight, and size 1-3 hours after feeding.  Breasts that are softer immediately after breastfeeding.  Increased milk volume, as well as a change in milk consistency and color by the fifth day of breastfeeding.  Nipples that are not sore, cracked, or bleeding. Signs that your baby is getting enough milk  Wetting at least 1-2 diapers during the first 24 hours after birth.  Wetting at least 5-6 diapers every 24 hours for the first week after birth. The urine should be clear or pale yellow by the age of 5 days.  Wetting 6-8 diapers every 24 hours as your baby continues to grow and develop.  At least 3 stools in a 24-hour period by the age of 5 days. The stool should be soft and yellow.  At least 3 stools in a 24-hour period by the age of 7 days. The stool should be seedy and yellow.  No loss of weight greater than 10% of birth weight during the first 3 days of life.  Average weight gain of 4-7 oz (113-198 g) per week after the age of 4 days.  Consistent daily weight gain by the age of 5 days, without weight loss after the age of 2 weeks. After a feeding, your baby may spit up a small amount of milk. This is normal. Breastfeeding frequency and duration Frequent feeding will help you make more milk and can prevent sore nipples and extremely full breasts (breast engorgement). Breastfeed when you feel the need to reduce the fullness of your breasts or when your baby shows signs of hunger. This is called "breastfeeding on demand." Signs that your baby is hungry include:  Increased alertness, activity, or restlessness.  Movement of the head from side to side.  Opening of the mouth when the corner of the mouth or cheek is stroked (rooting).  Increased  sucking sounds, smacking lips, cooing, sighing, or squeaking.  Hand-to-mouth movements and sucking on fingers or hands.  Fussing or crying. Avoid introducing a pacifier to your baby in the first 4-6 weeks after your baby is born. After this time, you may choose to use a pacifier. Research has shown that pacifier use during the first year of a baby's life decreases the risk of sudden infant death syndrome (SIDS). Allow your baby to feed on each breast as long as he or she wants. When your baby unlatches or falls asleep while feeding from the   first breast, offer the second breast. Because newborns are often sleepy in the first few weeks of life, you may need to awaken your baby to get him or her to feed. Breastfeeding times will vary from baby to baby. However, the following rules can serve as a guide to help you make sure that your baby is properly fed:  Newborns (babies 4 weeks of age or younger) may breastfeed every 1-3 hours.  Newborns should not go without breastfeeding for longer than 3 hours during the day or 5 hours during the night.  You should breastfeed your baby a minimum of 8 times in a 24-hour period. Breast milk pumping Pumping and storing breast milk allows you to make sure that your baby is exclusively fed your breast milk, even at times when you are unable to breastfeed. This is especially important if you go back to work while you are still breastfeeding, or if you are not able to be present during feedings. Your lactation consultant can help you find a method of pumping that works best for you and give you guidelines about how long it is safe to store breast milk.      Caring for your breasts while you breastfeed Nipples can become dry, cracked, and sore while breastfeeding. The following recommendations can help keep your breasts moisturized and healthy:  Avoid using soap on your nipples.  Wear a supportive bra designed especially for nursing. Avoid wearing underwire-style  bras or extremely tight bras (sports bras).  Air-dry your nipples for 3-4 minutes after each feeding.  Use only cotton bra pads to absorb leaked breast milk. Leaking of breast milk between feedings is normal.  Use lanolin on your nipples after breastfeeding. Lanolin helps to maintain your skin's normal moisture barrier. Pure lanolin is not harmful (not toxic) to your baby. You may also hand express a few drops of breast milk and gently massage that milk into your nipples and allow the milk to air-dry. In the first few weeks after giving birth, some women experience breast engorgement. Engorgement can make your breasts feel heavy, warm, and tender to the touch. Engorgement peaks within 3-5 days after you give birth. The following recommendations can help to ease engorgement:  Completely empty your breasts while breastfeeding or pumping. You may want to start by applying warm, moist heat (in the shower or with warm, water-soaked hand towels) just before feeding or pumping. This increases circulation and helps the milk flow. If your baby does not completely empty your breasts while breastfeeding, pump any extra milk after he or she is finished.  Apply ice packs to your breasts immediately after breastfeeding or pumping, unless this is too uncomfortable for you. To do this: ? Put ice in a plastic bag. ? Place a towel between your skin and the bag. ? Leave the ice on for 20 minutes, 2-3 times a day.  Make sure that your baby is latched on and positioned properly while breastfeeding. If engorgement persists after 48 hours of following these recommendations, contact your health care provider or a lactation consultant. Overall health care recommendations while breastfeeding  Eat 3 healthy meals and 3 snacks every day. Well-nourished mothers who are breastfeeding need an additional 450-500 calories a day. You can meet this requirement by increasing the amount of a balanced diet that you eat.  Drink  enough water to keep your urine pale yellow or clear.  Rest often, relax, and continue to take your prenatal vitamins to prevent fatigue, stress, and low   vitamin and mineral levels in your body (nutrient deficiencies).  Do not use any products that contain nicotine or tobacco, such as cigarettes and e-cigarettes. Your baby may be harmed by chemicals from cigarettes that pass into breast milk and exposure to secondhand smoke. If you need help quitting, ask your health care provider.  Avoid alcohol.  Do not use illegal drugs or marijuana.  Talk with your health care provider before taking any medicines. These include over-the-counter and prescription medicines as well as vitamins and herbal supplements. Some medicines that may be harmful to your baby can pass through breast milk.  It is possible to become pregnant while breastfeeding. If birth control is desired, ask your health care provider about options that will be safe while breastfeeding your baby. Where to find more information: [employer]: www.llli.org Contact a health care provider if:  You feel like you want to stop breastfeeding or have become frustrated with breastfeeding.  Your nipples are cracked or bleeding.  Your breasts are red, tender, or warm.  You have: ? Painful breasts or nipples. ? A swollen area on either breast. ? A fever or chills. ? Nausea or vomiting. ? Drainage other than breast milk from your nipples.  Your breasts do not become full before feedings by the fifth day after you give birth.  You feel sad and depressed.  Your baby is: ? Too sleepy to eat well. ? Having trouble sleeping. ? More than 1 week old and wetting fewer than 6 diapers in a 24-hour period. ? Not gaining weight by 5 days of age.  Your baby has fewer than 3 stools in a 24-hour period.  Your baby's skin or the white parts of his or her eyes become yellow. Get help right away if:  Your baby is overly tired  (lethargic) and does not want to wake up and feed.  Your baby develops an unexplained fever. Summary  Breastfeeding offers many health benefits for infant and mothers.  Try to breastfeed your infant when he or she shows early signs of hunger.  Gently tickle or stroke your baby's lips with your finger or nipple to allow the baby to open his or her mouth. Bring the baby to your breast. Make sure that much of the areola is in your baby's mouth. Offer one side and burp the baby before you offer the other side.  Talk with your health care provider or lactation consultant if you have questions or you face problems as you breastfeed. This information is not intended to replace advice given to you by your health care provider. Make sure you discuss any questions you have with your health care provider. Document Revised: 02/13/2018 Document Reviewed: 12/21/2016 Elsevier Patient Education  2021 Elsevier Inc.  

## 2021-01-06 DIAGNOSIS — Z03818 Encounter for observation for suspected exposure to other biological agents ruled out: Secondary | ICD-10-CM | POA: Diagnosis not present

## 2021-01-06 NOTE — BH Specialist Note (Signed)
Integrated Behavioral Health via Telemedicine Visit  01/06/2021 Monica Vazquez [id number]  Number of Integrated Behavioral Health visits: 1 Session Start time: 3:29  Session End time: 4:45 Total time: 16  Referring Provider: Merian Capron, MD Patient/Family location: Home [facility] Provider location: [facility]  All persons participating in visit: Patient Monica Vazquez and [facility] Monica Vazquez   Types of Service: Telephone visit  I connected with Phil Dopp and/or Allexis D Kreuser's n/a by Telephone  (Video is Caregility application) and verified that I am speaking with the correct person using two identifiers.Discussed confidentiality: Yes   I discussed the limitations of telemedicine and the availability of in person appointments.  Discussed there is a possibility of technology failure and discussed alternative modes of communication if that failure occurs.  I discussed that engaging in this telemedicine visit, they consent to the provision of behavioral healthcare and the services will be billed under their insurance.  Patient and/or legal guardian expressed understanding and consented to Telemedicine visit: Yes   Presenting Concerns: Patient and/or family reports the following symptoms/concerns: Pt states her primary concern today is "hard to focus" (not taking ADHD meds in pregnancy); copes by staying calm and trying to relax; no other concerns at this time.  Duration of problem: Current pregnancy; Severity of problem: mild  Patient and/or Family's Strengths/Protective Factors: Social connections and Sense of purpose  Goals Addressed: Patient will: 1.  Increase knowledge and/or ability of: healthy habits  2.  Demonstrate ability to: Increase motivation to adhere to plan of care  Progress towards Goals: Ongoing  Interventions: Interventions utilized:  Solution-Focused Strategies and Psychoeducation and/or  Health Education Standardized Assessments completed: Not Needed  Patient and/or Family Response: Pt agrees to treatment plan  Assessment: Patient currently experiencing ADHD and OUD on maintenance therapy.   Patient may benefit from psychoeducation and brief therapeutic interventions regarding coping with symptoms of anxiety related to ADHD.  Marland Kitchen  Plan: 1. Follow up with behavioral health clinician on : One month. Call Richard Holz at [phone] if needed prior to scheduled visit.  2. Behavioral recommendations:  -Continue taking Suboxone and prenatal vitamins as prescribed -[employer] of hospital and register for childbirth education class of choice at [email or web address]  -Read through Postpartum Planner (on After Visit Summary) -Consider apps (on AVS) for additional self-care and increased focus (will discuss further at next visit) 3. Referral(s): Integrated Art gallery manager (In Clinic) and Community Resources:  [email or web address]  I discussed the assessment and treatment plan with the patient and/or parent/guardian. They were provided an opportunity to ask questions and all were answered. They agreed with the plan and demonstrated an understanding of the instructions.   They were advised to call back or seek an in-person evaluation if the symptoms worsen or if the condition fails to improve as anticipated.  Rae Lips, LCSW   Depression screen [facility] 2/9 01/04/2021 12/21/2020 12/07/2020 11/24/2020 11/11/2020  Decreased Interest 1 0 1 1 2   Down, Depressed, Hopeless 0 0 0 0 0  PHQ - 2 Score 1 0 1 1 2   Altered sleeping 0 0 0 1 2  Tired, decreased energy 0 1 1 0 2  Change in appetite 0 0 0 0 1  Feeling bad or failure about yourself  0 0 0 0 0  Trouble concentrating 0 0 0 0 0  Moving slowly or fidgety/restless 0 0 0 0 0  Suicidal thoughts 0 0 0 0 0  PHQ-9 Score 1 1 2 2 7   Difficult doing work/chores - - - - -   GAD 7 : Generalized Anxiety Score 01/04/2021  12/21/2020 12/07/2020 11/24/2020  Nervous, Anxious, on Edge 1 0 0 0  Control/stop worrying 0 0 0 0  Worry too much - different things 1 1 0 0  Trouble relaxing 0 1 0 0  Restless 0 0 0 0  Easily annoyed or irritable 0 1 0 0  Afraid - awful might happen 0 0 0 0  Total GAD 7 Score 2 3 0 0

## 2021-01-09 LAB — BUPRENORPHINE + DRUG SCREEN, UR
6-Acetylmorphine: NEGATIVE ng/mL
Amphetamines: NEGATIVE ng/mL
Barbiturates: NEGATIVE ng/mL
Benzodiazepines: NEGATIVE ng/mL
Buprenorphine: POSITIVE ng/mL — AB
Carisoprodol: NEGATIVE ng/mL
Cocaine Metabolite: POSITIVE ng/mL — AB
Creatinine: 37.9 mg/dL (ref >=20)
Ethyl Glucuronide: NEGATIVE ng/mL
Fentanyl: NEGATIVE ng/mL
Gabapentin: NEGATIVE ug/mL
Marijuana MTB (THC): POSITIVE ng/mL — AB
Methadone: NEGATIVE ng/mL
Nitrites: NEGATIVE ug/mL (ref <200)
Opiates: NEGATIVE ng/mL
Oxycodone: NEGATIVE ng/mL
Phencyclidine (PCP): NEGATIVE ng/mL
Propoxyphene: NEGATIVE ng/mL
Tapentadol: NEGATIVE ng/mL
Tramadol: NEGATIVE ng/mL
Urine pH: 6.7 (ref 4.5–8.9)

## 2021-01-09 LAB — CARBOXY-THC NORMALIZED RATIO
Carboxy-THC: 271 ng/mL
THC/CR Ratio: 715 ng/mg Creat

## 2021-01-09 LAB — BENZOYLECGONINE, CONFIRMATION: Benzoylecgonine: 7743 ng/mL

## 2021-01-09 LAB — BUPRENORPHINE/NALOXONE CONFIRM
Buprenorphine/CR: 255.9 ng/mg Creat
Naloxone: 666 ng/ml
Norbup/Bup Ratio: 2.36 RATIO
Norbuprenorphine/CR: 604.2 ng/mg Creat

## 2021-01-12 ENCOUNTER — Ambulatory Visit (INDEPENDENT_AMBULATORY_CARE_PROVIDER_SITE_OTHER): Payer: Medicaid Other | Admitting: Clinical

## 2021-01-12 DIAGNOSIS — F1110 Opioid abuse, uncomplicated: Secondary | ICD-10-CM

## 2021-01-12 DIAGNOSIS — F909 Attention-deficit hyperactivity disorder, unspecified type: Secondary | ICD-10-CM

## 2021-01-12 NOTE — Patient Instructions (Signed)
Center for [facility] at [facility] [address] [place], [place] [address] [phone] (main office) [phone] [facility] office)  [email or web address] Scientist, water quality tour of hospital, register for childbirth education classes)     BRAINSTORMING  Develop a Plan Goals: . Provide a way to start conversation about your new life with a baby . Assist parents in recognizing and using resources within their reach . Help pave the way before birth for an easier period of transition afterwards.  Make a list of the following information to keep in a central location: . Full name of Mom and Partner: _____________________________________________ . Baby's full name and Date of Birth: ___________________________________________ . Home Address: ___________________________________________________________ ________________________________________________________________________ . Home Phone: ____________________________________________________________ . Parents' cell numbers: _____________________________________________________ ________________________________________________________________________ . Name and contact info for OB: ______________________________________________ . Name and contact info for Pediatrician:________________________________________ . Contact info for Lactation Consultants: ________________________________________  REST and SLEEP *You each need at least 4-5 hours of uninterrupted sleep every day. Write specific names and contact information.* . How are you going to rest in the postpartum period? While partner's home? When partner returns to work? When you both return to work? Marland Kitchen Where will your baby sleep? Marland Kitchen Who is available to help during the day? Evening? Night? . Who could move in for a period to help support you? Marland Kitchen What are some ideas to help you get enough  sleep? __________________________________________________________________________________________________________________________________________________________________________________________________________________________________________ NUTRITIOUS FOOD AND DRINK *Plan for meals before your baby is born so you can have healthy food to eat during the immediate postpartum period.* . Who will look after breakfast? Lunch? Dinner? List names and contact information. Brainstorm quick, healthy ideas for each meal. . What can you do before baby is born to prepare meals for the postpartum period? . How can others help you with meals? Marland Kitchen Which grocery stores provide online shopping and delivery? Marland Kitchen Which restaurants offer take-out or delivery options? ______________________________________________________________________________________________________________________________________________________________________________________________________________________________________________________________________________________________________________________________________________________________________________________________________  CARE FOR MOM *It's important that mom is cared for and pampered in the postpartum period. Remember, the most important ways new mothers need care are: sleep, nutrition, gentle exercise, and time off.* . Who can come take care of mom during this period? Make a list of people with their contact information. . List some activities that make you feel cared for, rested, and energized? Who can make sure you have opportunities to do these things? . Does mom have a space of her very own within your home that's just for her? Make a "[facility]" where she can be comfortable, rest, and renew herself  daily. ______________________________________________________________________________________________________________________________________________________________________________________________________________________________________________________________________________________________________________________________________________________________________________________________________    CARE FOR AND FEEDING BABY *Knowledgeable and encouraging people will offer the best support with regard to feeding your baby.* . Educate yourself and choose the best feeding option for your baby. . Make a list of people who will guide, support, and be a resource for you as your care for and feed your baby. (Friends that have breastfed or are currently breastfeeding, lactation consultants, breastfeeding support groups, etc.) . Consider a postpartum doula. (These websites can give you information: dona.org & https://shea.org/) . Seek out local breastfeeding resources like the breastfeeding support group at [employer] or [employer]. ______________________________________________________________________________________________________________________________________________________________________________________________________________________________________________________________________________________________________________________________________________________________________________________________________  Judson Roch AND ERRANDS . Who can help with a thorough cleaning before baby is born? . Make a list of people who will help with housekeeping and chores, like laundry, light cleaning, dishes, bathrooms, etc. . Who can run some errands for you? Marland Kitchen What can you do to make sure you are stocked with basic supplies  before baby is born? . Who is going to do the  shopping? ______________________________________________________________________________________________________________________________________________________________________________________________________________________________________________________________________________________________________________________________________________________________________________________________________     Family Adjustment *Nurture yourselves.it helps parents be more loving and allows for better bonding with their child.* . What sorts of things do you and partner enjoy doing together? Which activities help you to connect and strengthen your relationship? Make a list of those things. Make a list of people whom you trust to care for your baby so you can have some time together as a couple. . What types of things help partner feel connected to Mom? Make a list. . What needs will partner have in order to bond with baby? . Other children? Who will care for them when you go into labor and while you are in the hospital? . Think about what the needs of your older children might be. Who can help you meet those needs? In what ways are you helping them prepare for bringing baby home? List some specific strategies you have for family adjustment. _______________________________________________________________________________________________________________________________________________________________________________________________________________________________________________________________________________________________________________________________________________  SUPPORT *Someone who can empathize with experiences normalizes your problems and makes them more bearable.* . Make a list of other friends, neighbors, and/or co-workers you know with infants (and small children, if applicable) with whom you can connect. . Make a list of local or online support groups, mom groups, etc. in which you can be  involved. ______________________________________________________________________________________________________________________________________________________________________________________________________________________________________________________________________________________________________________________________________________________________________________________________________  Childcare Plans . Investigate and plan for childcare if mom is returning to work. . Talk about mom's concerns about her transition back to work. . Talk about partner's concerns regarding this transition.  Mental Health *Your mental health is one of the highest priorities for a pregnant or postpartum mom.* . 1 in 5 women experience anxiety and/or depression from the time of conception through the first year after birth. . Postpartum Mood Disorders are the #1 complication of pregnancy and childbirth and the suffering experienced by these mothers is not necessary! These illnesses are temporary and respond well to treatment, which often includes self-care, social support, talk therapy, and medication when needed. . Women experiencing anxiety and depression often say things like: "I'm supposed to be happy.why do I feel so sad?", "Why can't I snap out of it?", "I'm having thoughts that scare me." . There is no need to be embarrassed if you are feeling these symptoms: o Overwhelmed, anxious, angry, sad, guilty, irritable, hopeless, exhausted but can't sleep o You are NOT alone. You are NOT to blame. With help, you WILL be well. . Where can I find help? Medical professionals such as your OB, midwife, gynecologist, family practitioner, primary care provider, pediatrician, or mental health providers; [facility] support groups: Feelings After Birth, Breastfeeding Support Group, Baby and Me Group, and Fit 4 Two exercise classes. . You have permission to ask for help. It will confirm your feelings, validate your  experiences, share/learn coping strategies, and gain support and encouragement as you heal. You are important! BRAINSTORM . Make a list of local resources, including resources for mom and for partner. . Identify support groups. . Identify people to call late at night - include names and contact info. . Talk with partner about perinatal mood and anxiety disorders. . Talk with your OB, midwife, and doula about baby blues and about perinatal mood and anxiety disorders. . Talk with your pediatrician about perinatal mood and anxiety disorders.   Support & Sanity Savers   What do you really need?  . Basics . In preparing for a new baby, many expectant parents spend hours shopping  for baby clothes, decorating the nursery, and deciding which car seat to buy. Yet most don't think much about what the reality of parenting a newborn will be like, and what they need to make it through that. So, here is the advice of experienced parents. We know you'll read this, and think "they're exaggerating, I don't really need that." Just trust [name] on these, OK? Plan for all of this, and if it turns out you don't need it, come back and teach [name] how you did it!  Satira Anis (Once baby's survival needs are met, make sure you attend to your own survival needs!) . Sleep . An average newborn sleeps 16-18 hours per day, over 6-7 sleep periods, rarely more than three hours at a time. It is normal and healthy for a newborn to wake throughout the night... but really hard on parents!! . Naps. Prioritize sleep above any responsibilities like: cleaning house, visiting friends, running errands, etc.  Sleep whenever baby sleeps. If you can't nap, at least have restful times when baby eats. The more rest you get, the more patient you will be, the more emotionally stable, and better at solving problems.  . Food . You may not have realized it would be difficult to eat when you have a newborn. Yet, when we talk to . countless new  parents, they say things like "it may be 2:00 pm when I realize I haven't had breakfast yet." Or "every time we sit down to dinner, baby needs to eat, and my food gets cold, so I don't bother to eat it." . Finger food. Before your baby is born, stock up with one months' worth of food that: 1) you can eat with one hand while holding a baby, 2) doesn't need to be prepped, 3) is good hot or cold, 4) doesn't spoil when left out for a few hours, and 5) you like to eat. Think about: nuts, dried fruit, Clif bars, pretzels, jerky, gogurt, baby carrots, apples, bananas, crackers, cheez-n-crackers, string cheese, hot pockets or frozen burritos to microwave, garden burgers and breakfast pastries to put in the toaster, yogurt drinks, etc. . Restaurant Menus. Make lists of your favorite restaurants & menu items. When family/friends want to help, you can give specific information without much thought. They can either bring you the food or send gift cards for just the right meals. Rosaura Carpenter Meals.  Take some time to make a few meals to put in the freezer ahead of time.  Easy to freeze meals can be anything such as soup, lasagna, chicken pie, or spaghetti sauce. . Set up a Meal Schedule.  Ask friends and family to sign up to bring you meals during the first few weeks of being home. (It can be passed around at baby showers!) You have no idea how helpful this will be until you are in the throes of parenting.  https://hamilton-woodard.com/ is a great website to check out. . Emotional Support . Know who to call when you're stressed out. Parenting a newborn is very challenging work. There are times when it totally overwhelms your normal coping abilities. EVERY NEW PARENT NEEDS TO HAVE A PLAN FOR WHO TO CALL WHEN THEY JUST CAN'T COPE ANY MORE. (And it has to be someone other than the baby's other parent!) Before your baby is born, come up with at least one person you can call for support - write their phone number down and post it on the  refrigerator. Marland Kitchen Anxiety & Sadness. Baby blues  are normal after pregnancy; however, there are more severe types of anxiety & sadness which can occur and should not be ignored.  They are always treatable, but you have to take the first step by reaching out for help. [facility] offers a "Mom Talk" group which meets every Tuesday from 10 am - 11 am.  This group is for new moms who need support and connection after their babies are born.  Call [phone].  Marland Kitchen Really, Really Helpful (Plan for them! Make sure these happen often!!) . Physical Support with Taking Care of Yourselves . Asking friends and family. Before your baby is born, set up a schedule of people who can come and visit and help out (or ask a friend to schedule for you). Any time someone says "let me know what I can do to help," sign them up for a day. When they get there, their job is not to take care of the baby (that's your job and your joy). Their job is to take care of you!  . Postpartum doulas. If you don't have anyone you can call on for support, look into postpartum doulas:  professionals at helping parents with caring for baby, caring for themselves, getting breastfeeding started, and helping with household tasks. www.padanc.org is a helpful website for learning about doulas in our area. . Peer Support / Parent Groups . Why: One of the greatest ideas for new parents is to be around other new parents. Parent groups give you a chance to share and listen to others who are going through the same season of life, get a sense of what is normal infant development by watching several babies learn and grow, share your stories of triumph and struggles with empathetic ears, and forgive your own mistakes when you realize all parents are learning by trial and error. . Where to find: There are many places you can meet other new parents throughout our community.  [facility] offers the following classes for new moms and their little ones:  Baby  and Me (Birth to [facility]) and Breastfeeding Support Group. Go to www.conehealthybaby.com or call [phone] for more information. . Time for your Relationship . It's easy to get so caught up in meeting baby's immediate needs that it's hard to find time to connect with your partner, and meet the needs of your relationship. It's also easy to forget what "quality time with your partner" actually looks like. If you take your baby on a date, you'd be amazed how much of your couple time is spent feeding the baby, diapering the baby, admiring the baby, and talking about the baby. . Dating: Try to take time for just the two of you. Babysitter tip: Sometimes when moms are breastfeeding a newborn, they find it hard to figure out how to schedule outings around baby's unpredictable feeding schedules. Have the babysitter come for a three hour period. When she comes over, if baby has just eaten, you can leave right away, and come back in two hours. If baby hasn't fed recently, you start the date at home. Once baby gets hungry and gets a good feeding in, you can head out for the rest of your date time. . Date Nights at Home: If you can't get out, at least set aside one evening a week to prioritize your relationship: whenever baby dozes off or doesn't have any immediate needs, spend a little time focusing on each other. . Potential conflicts: The main relationship conflicts that come up for new parents  are: issues related to sexuality, financial stresses, a feeling of an unfair division of household tasks, and conflicts in parenting styles. The more you can work on these issues before baby arrives, the better!  Clint Guy and Frills (Don't forget these. and don't feel guilty for indulging in them!) . Everyone has something in life that is a fun little treat that they do just for themselves. It may be: reading the morning paper, or going for a daily jog, or having coffee with a friend once a week, or going to a movie on Friday  nights, or fine chocolates, or bubble baths, or curling up with a good book. . Unless you do fun things for yourself every now and then, it's hard to have the energy for fun with your baby. Whatever your "special" treats are, make sure you find a way to continue to indulge in them after your baby is born. These special moments can recharge you, and allow you to return to baby with a new joy   PERINATAL MOOD DISORDERS: [place]   Emergency and Crisis Resources:  If you are an imminent risk to self or others, are experiencing intense personal distress, and/or have noticed significant changes in activities of daily living, call:  . 911 . [facility]: 4304190616 . Mobile Crisis: 581 699 9488 . National Suicide Hotline: 941-327-2608 Or visit the following crisis centers: . Local Emergency Departments . [facility]: [address] [place]. Hours: 8:30AM-5PM. Insurance Accepted: Medicaid, Medicare, and Uninsured.  Marland Kitchen [facility]  [address], [place] Mon-Friday 8am-3pm  [phone]                                                                                    Non-Crisis Resources: To identify specific providers that are covered by your insurance, contact your insurance company or local agencies: [place]: 4245618663 [facility] and [place]: [phone] [place]: [phone] Postpartum Support International- [place] 1-2141542292                                                      Outpatient therapy and medication management providers:  [facility] [phone] Hours: 9AM-5PM  Insurance Accepted: Alben Spittle, Lorella Nimrod, Freddrick March, [place], Medicare  [facility] ([place]) [phone] Hours: 8AM-5PM  nsurance Accepted: All insurances EXCEPT AARP, [place], [place], and  [place]: [phone]             Hours: 8AM-8PM Insurance Accepted: Cristal Ford, Freddrick March, [place], Medicare, [phone] [facility]: [phone] Hours: 8:30AM-7PM Insurance Accepted: Cristal Ford, Medicaid, Medicare, Tricare, [employer] Counseling:  [place] Accepted:  Holland Falling, Lorella Nimrod, [employer], [employer], [facility] [phone] Hours: 9AM-5:30PM Insurance Accepted: Alben Spittle, [place], [place], and Medicaid, [employer], [employer]  Counseling:  [phone] Hours: 9am-5pm Insurance Accepted: [place]; they do not accept Medicaid/Medicare [place]: [phone] Hours: 9am-9pm Insurance Accepted: All major insurance including Medicaid and Medicare [facility]: [phone] Hours: 9AM-5:30PM Insurance Accepted: All insurances EXCEPT Medicaid and Medicare. [facility]: [place]: [phone] [place]:  [place] (support for children in the NICU and/or with special needs), [employer]: [phone]                                                                                     Online Resources: Postpartum Support International: http://jones-berg.com/  800-944-4PPD 2Moms Supporting Moms:  www.momssupportingmoms.net   /Emotional [employer] and Websites Here are a few free apps meant to help you to help yourself.  To find, try searching on the internet to see if the app is offered on Apple/Android devices. If your first choice doesn't come up  on your device, the good news is that there are many choices! Play around with different apps to see which ones are helpful to you.    Calm This is an app meant to help increase calm feelings. Includes info, strategies, and tools for tracking your feelings.      Calm Harm  This app is meant to help with self-harm. Provides many 5-minute or 15-min coping strategies for doing instead of hurting yourself.       [place] is a problem-solving tool to help deal with emotions and cope with stress you encounter wherever you are.      MindShift This app can help people cope with anxiety. Rather than trying to avoid anxiety, you can make an important shift and face it.      MY3  MY3 features a support system, safety plan and resources with the goal of offering a tool to use in a time of need.       My Life My Voice  This mood journal offers a simple solution for tracking your thoughts, feelings and moods. Animated emoticons can help identify your mood.       Relax Melodies Designed to help with sleep, on this app you can mix sounds and meditations for relaxation.      Smiling Mind Smiling Mind is meditation made easy: it's a simple tool that  helps put a smile on your mind.        Stop, Breathe & Think  A friendly, simple guide for people through meditations for mindfulness and compassion.  Stop, Breathe and Think Kids Enter your current feelings and choose a "mission" to help you cope. Offers videos for certain moods instead of just sound recordings.       Team Orange The goal of this tool is to help teens change how they think, act, and react. This app helps you focus on your own good feelings and experiences.      The Ash[place] Box The [place] Box (VHB) contains simple tools to help patients with coping, relaxation, distraction, and positive thinking.

## 2021-01-13 ENCOUNTER — Ambulatory Visit: Payer: Medicaid Other | Admitting: *Deleted

## 2021-01-13 ENCOUNTER — Encounter: Payer: Self-pay | Admitting: *Deleted

## 2021-01-13 ENCOUNTER — Other Ambulatory Visit: Payer: Self-pay | Admitting: *Deleted

## 2021-01-13 ENCOUNTER — Other Ambulatory Visit: Payer: Self-pay

## 2021-01-13 ENCOUNTER — Ambulatory Visit: Payer: Medicaid Other | Attending: Obstetrics and Gynecology

## 2021-01-13 DIAGNOSIS — O99322 Drug use complicating pregnancy, second trimester: Secondary | ICD-10-CM | POA: Diagnosis not present

## 2021-01-13 DIAGNOSIS — O99332 Smoking (tobacco) complicating pregnancy, second trimester: Secondary | ICD-10-CM | POA: Diagnosis not present

## 2021-01-13 DIAGNOSIS — F1910 Other psychoactive substance abuse, uncomplicated: Secondary | ICD-10-CM | POA: Diagnosis not present

## 2021-01-13 DIAGNOSIS — Z3A24 24 weeks gestation of pregnancy: Secondary | ICD-10-CM

## 2021-01-13 DIAGNOSIS — F1120 Opioid dependence, uncomplicated: Secondary | ICD-10-CM | POA: Insufficient documentation

## 2021-01-13 DIAGNOSIS — O99891 Other specified diseases and conditions complicating pregnancy: Secondary | ICD-10-CM | POA: Diagnosis present

## 2021-01-13 DIAGNOSIS — Z283 Underimmunization status: Secondary | ICD-10-CM | POA: Insufficient documentation

## 2021-01-13 DIAGNOSIS — O99320 Drug use complicating pregnancy, unspecified trimester: Secondary | ICD-10-CM | POA: Diagnosis present

## 2021-01-13 DIAGNOSIS — O358XX0 Maternal care for other (suspected) fetal abnormality and damage, not applicable or unspecified: Secondary | ICD-10-CM | POA: Diagnosis not present

## 2021-01-13 DIAGNOSIS — F1410 Cocaine abuse, uncomplicated: Secondary | ICD-10-CM

## 2021-01-13 DIAGNOSIS — O99321 Drug use complicating pregnancy, first trimester: Secondary | ICD-10-CM | POA: Diagnosis present

## 2021-01-13 DIAGNOSIS — O0990 Supervision of high risk pregnancy, unspecified, unspecified trimester: Secondary | ICD-10-CM

## 2021-01-13 DIAGNOSIS — F1290 Cannabis use, unspecified, uncomplicated: Secondary | ICD-10-CM | POA: Insufficient documentation

## 2021-01-13 DIAGNOSIS — Z362 Encounter for other antenatal screening follow-up: Secondary | ICD-10-CM | POA: Diagnosis present

## 2021-01-13 DIAGNOSIS — O2341 Unspecified infection of urinary tract in pregnancy, first trimester: Secondary | ICD-10-CM | POA: Insufficient documentation

## 2021-01-13 DIAGNOSIS — O352XX0 Maternal care for (suspected) hereditary disease in fetus, not applicable or unspecified: Secondary | ICD-10-CM

## 2021-01-13 DIAGNOSIS — Z148 Genetic carrier of other disease: Secondary | ICD-10-CM

## 2021-01-13 DIAGNOSIS — O09899 Supervision of other high risk pregnancies, unspecified trimester: Secondary | ICD-10-CM

## 2021-01-17 ENCOUNTER — Ambulatory Visit (INDEPENDENT_AMBULATORY_CARE_PROVIDER_SITE_OTHER): Payer: Medicaid Other | Admitting: Family Medicine

## 2021-01-17 ENCOUNTER — Other Ambulatory Visit: Payer: Self-pay

## 2021-01-17 VITALS — BP 119/69 | HR 89 | Wt 192.5 lb

## 2021-01-17 DIAGNOSIS — F1410 Cocaine abuse, uncomplicated: Secondary | ICD-10-CM

## 2021-01-17 DIAGNOSIS — Z2839 Other underimmunization status: Secondary | ICD-10-CM

## 2021-01-17 DIAGNOSIS — F17200 Nicotine dependence, unspecified, uncomplicated: Secondary | ICD-10-CM

## 2021-01-17 DIAGNOSIS — O99321 Drug use complicating pregnancy, first trimester: Secondary | ICD-10-CM

## 2021-01-17 DIAGNOSIS — F1120 Opioid dependence, uncomplicated: Secondary | ICD-10-CM

## 2021-01-17 DIAGNOSIS — Z283 Underimmunization status: Secondary | ICD-10-CM

## 2021-01-17 DIAGNOSIS — F1290 Cannabis use, unspecified, uncomplicated: Secondary | ICD-10-CM

## 2021-01-17 DIAGNOSIS — O0990 Supervision of high risk pregnancy, unspecified, unspecified trimester: Secondary | ICD-10-CM

## 2021-01-17 DIAGNOSIS — O99320 Drug use complicating pregnancy, unspecified trimester: Secondary | ICD-10-CM

## 2021-01-17 DIAGNOSIS — O99891 Other specified diseases and conditions complicating pregnancy: Secondary | ICD-10-CM

## 2021-01-17 MED ORDER — BUPRENORPHINE HCL-NALOXONE HCL 8-2 MG SL FILM
8.0000 mg | ORAL_FILM | Freq: Two times a day (BID) | SUBLINGUAL | 0 refills | Status: DC
Start: 2021-01-17 — End: 2021-01-31

## 2021-01-17 NOTE — Progress Notes (Signed)
   Subjective:  Monica Vazquez is a 25 y.o. G2P0010 at [redacted]w[redacted]d being seen today for ongoing prenatal care.  She is currently monitored for the following issues for this high-risk pregnancy and has Supervision of high risk pregnancy, antepartum; Suboxone maintenance treatment complicating pregnancy, antepartum (HCC); Smoker; Anxiety; Maternal UTI (urinary tract infection), first trimester; Rubella non-immune status, antepartum; Cocaine abuse affecting pregnancy in first trimester (HCC); Marijuana use; and Abn chromsoml and genetic find on antenat screen of mother on their problem list.  Patient reports heartburn.  Contractions: Irritability. Vag. Bleeding: None.  Movement: Present. Denies leaking of fluid.   The following portions of the patient's history were reviewed and updated as appropriate: allergies, current medications, past family history, past medical history, past social history, past surgical history and problem list. Problem list updated.  Objective:   Vitals:   01/17/21 1626  BP: 119/69  Pulse: 89  Weight: 192 lb 8 oz (87.3 kg)    Fetal Status: Fetal Heart Rate (bpm): 148   Movement: Present     General:  Alert, oriented and cooperative. Patient is in no acute distress.  Skin: Skin is warm and dry. No rash noted.   Cardiovascular: Normal heart rate noted  Respiratory: Normal respiratory effort, no problems with respiration noted  Abdomen: Soft, gravid, appropriate for gestational age. Pain/Pressure: Absent     Pelvic: Vag. Bleeding: None     Cervical exam deferred        Extremities: Normal range of motion.  Edema: Trace  Mental Status: Normal mood and affect. Normal behavior. Normal judgment and thought content.   Urinalysis:      Assessment and Plan:  Pregnancy: G2P0010 at [redacted]w[redacted]d  1. Supervision of high risk pregnancy, antepartum BP and FHR normal Last growth [name] on 01/13/2021 normal, following regularly w MFM Have chosen a name (Jamarion, spelling TBD), will also  have middle names honoring her father and step-father  2. Suboxone maintenance treatment complicating pregnancy, antepartum (HCC) Stable on 8mg  BID Reviewed UDS from last visit which appropriately has positive buprenorphine and no longer has oxycodone compared to prior (use of oxycodone was openly disclosed, see prior notes) However continues to have both marijuana and cocaine on both screening and confirmatory testing Reviewed this result with patient, reports she has not been knowingly using cocaine at any point, does continue to use marijuana as an appetite stimulant I strongly urged patient to stop using MJ as in the absence of known use it is highly likely that it is laced with cocaine. In particular I emphasized the risks of abruption associated with cocaine Patient will try to stop using, repeat UDS at next visit  3. Smoker   4. Rubella non-immune status, antepartum   5. Marijuana use   6. Cocaine abuse affecting pregnancy in first trimester [facility]) See above  Preterm labor symptoms and general obstetric precautions including but not limited to vaginal bleeding, contractions, leaking of fluid and fetal movement were reviewed in detail with the patient. Please refer to After Visit Summary for other counseling recommendations.  Return in 2 weeks (on 01/31/2021) for [facility], ob visit.   [facility], MD

## 2021-01-17 NOTE — Patient Instructions (Signed)

## 2021-01-20 DIAGNOSIS — Z03818 Encounter for observation for suspected exposure to other biological agents ruled out: Secondary | ICD-10-CM | POA: Diagnosis not present

## 2021-01-31 ENCOUNTER — Ambulatory Visit (INDEPENDENT_AMBULATORY_CARE_PROVIDER_SITE_OTHER): Payer: Medicaid Other | Admitting: Family Medicine

## 2021-01-31 ENCOUNTER — Encounter: Payer: Self-pay | Admitting: Family Medicine

## 2021-01-31 ENCOUNTER — Other Ambulatory Visit: Payer: Self-pay

## 2021-01-31 VITALS — BP 124/81 | HR 114 | Wt 193.4 lb

## 2021-01-31 DIAGNOSIS — Z283 Underimmunization status: Secondary | ICD-10-CM

## 2021-01-31 DIAGNOSIS — F1120 Opioid dependence, uncomplicated: Secondary | ICD-10-CM

## 2021-01-31 DIAGNOSIS — Z23 Encounter for immunization: Secondary | ICD-10-CM | POA: Diagnosis not present

## 2021-01-31 DIAGNOSIS — O09899 Supervision of other high risk pregnancies, unspecified trimester: Secondary | ICD-10-CM

## 2021-01-31 DIAGNOSIS — F1410 Cocaine abuse, uncomplicated: Secondary | ICD-10-CM

## 2021-01-31 DIAGNOSIS — F17200 Nicotine dependence, unspecified, uncomplicated: Secondary | ICD-10-CM

## 2021-01-31 DIAGNOSIS — O99321 Drug use complicating pregnancy, first trimester: Secondary | ICD-10-CM

## 2021-01-31 DIAGNOSIS — O99891 Other specified diseases and conditions complicating pregnancy: Secondary | ICD-10-CM

## 2021-01-31 DIAGNOSIS — O0990 Supervision of high risk pregnancy, unspecified, unspecified trimester: Secondary | ICD-10-CM

## 2021-01-31 DIAGNOSIS — O99320 Drug use complicating pregnancy, unspecified trimester: Secondary | ICD-10-CM

## 2021-01-31 MED ORDER — BUPRENORPHINE HCL-NALOXONE HCL 8-2 MG SL FILM: 8.0000 mg | ORAL_FILM | Freq: Two times a day (BID) | SUBLINGUAL | 0 refills | Status: DC

## 2021-01-31 NOTE — Progress Notes (Signed)
Pt states has been spotting this week when she wipes but it has stopped.

## 2021-01-31 NOTE — Patient Instructions (Signed)
 Contraception Choices Contraception, also called birth control, refers to methods or devices that prevent pregnancy. Hormonal methods Contraceptive implant A contraceptive implant is a thin, plastic tube that contains a hormone that prevents pregnancy. It is different from an intrauterine device (IUD). It is inserted into the upper part of the arm by a health care provider. Implants can be effective for up to 3 years. Progestin-only injections Progestin-only injections are injections of progestin, a synthetic form of the hormone progesterone. They are given every 3 months by a health care provider. Birth control pills Birth control pills are pills that contain hormones that prevent pregnancy. They must be taken once a day, preferably at the same time each day. A prescription is needed to use this method of contraception. Birth control patch The birth control patch contains hormones that prevent pregnancy. It is placed on the skin and must be changed once a week for three weeks and removed on the fourth week. A prescription is needed to use this method of contraception. Vaginal ring A vaginal ring contains hormones that prevent pregnancy. It is placed in the vagina for three weeks and removed on the fourth week. After that, the process is repeated with a new ring. A prescription is needed to use this method of contraception. Emergency contraceptive Emergency contraceptives prevent pregnancy after unprotected sex. They come in pill form and can be taken up to 5 days after sex. They work best the sooner they are taken after having sex. Most emergency contraceptives are available without a prescription. This method should not be used as your only form of birth control.   Barrier methods Female condom A female condom is a thin sheath that is worn over the penis during sex. Condoms keep sperm from going inside a woman's body. They can be used with a sperm-killing substance (spermicide) to increase their  effectiveness. They should be thrown away after one use. Female condom A female condom is a soft, loose-fitting sheath that is put into the vagina before sex. The condom keeps sperm from going inside a woman's body. They should be thrown away after one use. Diaphragm A diaphragm is a soft, dome-shaped barrier. It is inserted into the vagina before sex, along with a spermicide. The diaphragm blocks sperm from entering the uterus, and the spermicide kills sperm. A diaphragm should be left in the vagina for 6-8 hours after sex and removed within 24 hours. A diaphragm is prescribed and fitted by a health care provider. A diaphragm should be replaced every 1-2 years, after giving birth, after gaining more than 15 lb (6.8 kg), and after pelvic surgery. Cervical cap A cervical cap is a round, soft latex or plastic cup that fits over the cervix. It is inserted into the vagina before sex, along with spermicide. It blocks sperm from entering the uterus. The cap should be left in place for 6-8 hours after sex and removed within 48 hours. A cervical cap must be prescribed and fitted by a health care provider. It should be replaced every 2 years. Sponge A sponge is a soft, circular piece of polyurethane foam with spermicide in it. The sponge helps block sperm from entering the uterus, and the spermicide kills sperm. To use it, you make it wet and then insert it into the vagina. It should be inserted before sex, left in for at least 6 hours after sex, and removed and thrown away within 30 hours. Spermicides Spermicides are chemicals that kill or block sperm from entering the   cervix and uterus. They can come as a cream, jelly, suppository, foam, or tablet. A spermicide should be inserted into the vagina with an applicator at least 10-15 minutes before sex to allow time for it to work. The process must be repeated every time you have sex. Spermicides do not require a prescription.   Intrauterine  contraception Intrauterine device (IUD) An IUD is a T-shaped device that is put in a woman's uterus. There are two types:  Hormone IUD.This type contains progestin, a synthetic form of the hormone progesterone. This type can stay in place for 3-5 years.  Copper IUD.This type is wrapped in copper wire. It can stay in place for 10 years. Permanent methods of contraception Female tubal ligation In this method, a woman's fallopian tubes are sealed, tied, or blocked during surgery to prevent eggs from traveling to the uterus. Hysteroscopic sterilization In this method, a small, flexible insert is placed into each fallopian tube. The inserts cause scar tissue to form in the fallopian tubes and block them, so sperm cannot reach an egg. The procedure takes about 3 months to be effective. Another form of birth control must be used during those 3 months. Female sterilization This is a procedure to tie off the tubes that carry sperm (vasectomy). After the procedure, the man can still ejaculate fluid (semen). Another form of birth control must be used for 3 months after the procedure. Natural planning methods Natural family planning In this method, a couple does not have sex on days when the woman could become pregnant. Calendar method In this method, the woman keeps track of the length of each menstrual cycle, identifies the days when pregnancy can happen, and does not have sex on those days. Ovulation method In this method, a couple avoids sex during ovulation. Symptothermal method This method involves not having sex during ovulation. The woman typically checks for ovulation by watching changes in her temperature and in the consistency of cervical mucus. Post-ovulation method In this method, a couple waits to have sex until after ovulation. Where to find more information  Centers for Disease Control and Prevention: www.cdc.gov Summary  Contraception, also called birth control, refers to methods or  devices that prevent pregnancy.  Hormonal methods of contraception include implants, injections, pills, patches, vaginal rings, and emergency contraceptives.  Barrier methods of contraception can include female condoms, female condoms, diaphragms, cervical caps, sponges, and spermicides.  There are two types of IUDs (intrauterine devices). An IUD can be put in a woman's uterus to prevent pregnancy for 3-5 years.  Permanent sterilization can be done through a procedure for males and females. Natural family planning methods involve nothaving sex on days when the woman could become pregnant. This information is not intended to replace advice given to you by your health care provider. Make sure you discuss any questions you have with your health care provider. Document Revised: 04/25/2020 Document Reviewed: 04/25/2020 Elsevier Patient Education  2021 Elsevier Inc.   Breastfeeding  Choosing to breastfeed is one of the best decisions you can make for yourself and your baby. A change in hormones during pregnancy causes your breasts to make breast milk in your milk-producing glands. Hormones prevent breast milk from being released before your baby is born. They also prompt milk flow after birth. Once breastfeeding has begun, thoughts of your baby, as well as his or her sucking or crying, can stimulate the release of milk from your milk-producing glands. Benefits of breastfeeding Research shows that breastfeeding offers many health benefits   for infants and mothers. It also offers a cost-free and convenient way to feed your baby. For your baby  Your first milk (colostrum) helps your baby's digestive system to function better.  Special cells in your milk (antibodies) help your baby to fight off infections.  Breastfed babies are less likely to develop asthma, allergies, obesity, or type 2 diabetes. They are also at lower risk for sudden infant death syndrome (SIDS).  Nutrients in breast milk are better  able to meet your baby's needs compared to infant formula.  Breast milk improves your baby's brain development. For you  Breastfeeding helps to create a very special bond between you and your baby.  Breastfeeding is convenient. Breast milk costs nothing and is always available at the correct temperature.  Breastfeeding helps to burn calories. It helps you to lose the weight that you gained during pregnancy.  Breastfeeding makes your uterus return faster to its size before pregnancy. It also slows bleeding (lochia) after you give birth.  Breastfeeding helps to lower your risk of developing type 2 diabetes, osteoporosis, rheumatoid arthritis, cardiovascular disease, and breast, ovarian, uterine, and endometrial cancer later in life. Breastfeeding basics Starting breastfeeding  Find a comfortable place to sit or lie down, with your neck and back well-supported.  Place a pillow or a rolled-up blanket under your baby to bring him or her to the level of your breast (if you are seated). Nursing pillows are specially designed to help support your arms and your baby while you breastfeed.  Make sure that your baby's tummy (abdomen) is facing your abdomen.  Gently massage your breast. With your fingertips, massage from the outer edges of your breast inward toward the nipple. This encourages milk flow. If your milk flows slowly, you may need to continue this action during the feeding.  Support your breast with 4 fingers underneath and your thumb above your nipple (make the letter "C" with your hand). Make sure your fingers are well away from your nipple and your baby's mouth.  Stroke your baby's lips gently with your finger or nipple.  When your baby's mouth is open wide enough, quickly bring your baby to your breast, placing your entire nipple and as much of the areola as possible into your baby's mouth. The areola is the colored area around your nipple. ? More areola should be visible above your  baby's upper lip than below the lower lip. ? Your baby's lips should be opened and extended outward (flanged) to ensure an adequate, comfortable latch. ? Your baby's tongue should be between his or her lower gum and your breast.  Make sure that your baby's mouth is correctly positioned around your nipple (latched). Your baby's lips should create a seal on your breast and be turned out (everted).  It is common for your baby to suck about 2-3 minutes in order to start the flow of breast milk. Latching Teaching your baby how to latch onto your breast properly is very important. An improper latch can cause nipple pain, decreased milk supply, and poor weight gain in your baby. Also, if your baby is not latched onto your nipple properly, he or she may swallow some air during feeding. This can make your baby fussy. Burping your baby when you switch breasts during the feeding can help to get rid of the air. However, teaching your baby to latch on properly is still the best way to prevent fussiness from swallowing air while breastfeeding. Signs that your baby has successfully latched onto   your nipple  Silent tugging or silent sucking, without causing you pain. Infant's lips should be extended outward (flanged).  Swallowing heard between every 3-4 sucks once your milk has started to flow (after your let-down milk reflex occurs).  Muscle movement above and in front of his or her ears while sucking. Signs that your baby has not successfully latched onto your nipple  Sucking sounds or smacking sounds from your baby while breastfeeding.  Nipple pain. If you think your baby has not latched on correctly, slip your finger into the corner of your baby's mouth to break the suction and place it between your baby's gums. Attempt to start breastfeeding again. Signs of successful breastfeeding Signs from your baby  Your baby will gradually decrease the number of sucks or will completely stop sucking.  Your baby  will fall asleep.  Your baby's body will relax.  Your baby will retain a small amount of milk in his or her mouth.  Your baby will let go of your breast by himself or herself. Signs from you  Breasts that have increased in firmness, weight, and size 1-3 hours after feeding.  Breasts that are softer immediately after breastfeeding.  Increased milk volume, as well as a change in milk consistency and color by the fifth day of breastfeeding.  Nipples that are not sore, cracked, or bleeding. Signs that your baby is getting enough milk  Wetting at least 1-2 diapers during the first 24 hours after birth.  Wetting at least 5-6 diapers every 24 hours for the first week after birth. The urine should be clear or pale yellow by the age of 5 days.  Wetting 6-8 diapers every 24 hours as your baby continues to grow and develop.  At least 3 stools in a 24-hour period by the age of 5 days. The stool should be soft and yellow.  At least 3 stools in a 24-hour period by the age of 7 days. The stool should be seedy and yellow.  No loss of weight greater than 10% of birth weight during the first 3 days of life.  Average weight gain of 4-7 oz (113-198 g) per week after the age of 4 days.  Consistent daily weight gain by the age of 5 days, without weight loss after the age of 2 weeks. After a feeding, your baby may spit up a small amount of milk. This is normal. Breastfeeding frequency and duration Frequent feeding will help you make more milk and can prevent sore nipples and extremely full breasts (breast engorgement). Breastfeed when you feel the need to reduce the fullness of your breasts or when your baby shows signs of hunger. This is called "breastfeeding on demand." Signs that your baby is hungry include:  Increased alertness, activity, or restlessness.  Movement of the head from side to side.  Opening of the mouth when the corner of the mouth or cheek is stroked (rooting).  Increased  sucking sounds, smacking lips, cooing, sighing, or squeaking.  Hand-to-mouth movements and sucking on fingers or hands.  Fussing or crying. Avoid introducing a pacifier to your baby in the first 4-6 weeks after your baby is born. After this time, you may choose to use a pacifier. Research has shown that pacifier use during the first year of a baby's life decreases the risk of sudden infant death syndrome (SIDS). Allow your baby to feed on each breast as long as he or she wants. When your baby unlatches or falls asleep while feeding from the   first breast, offer the second breast. Because newborns are often sleepy in the first few weeks of life, you may need to awaken your baby to get him or her to feed. Breastfeeding times will vary from baby to baby. However, the following rules can serve as a guide to help you make sure that your baby is properly fed:  Newborns (babies 4 weeks of age or younger) may breastfeed every 1-3 hours.  Newborns should not go without breastfeeding for longer than 3 hours during the day or 5 hours during the night.  You should breastfeed your baby a minimum of 8 times in a 24-hour period. Breast milk pumping Pumping and storing breast milk allows you to make sure that your baby is exclusively fed your breast milk, even at times when you are unable to breastfeed. This is especially important if you go back to work while you are still breastfeeding, or if you are not able to be present during feedings. Your lactation consultant can help you find a method of pumping that works best for you and give you guidelines about how long it is safe to store breast milk.      Caring for your breasts while you breastfeed Nipples can become dry, cracked, and sore while breastfeeding. The following recommendations can help keep your breasts moisturized and healthy:  Avoid using soap on your nipples.  Wear a supportive bra designed especially for nursing. Avoid wearing underwire-style  bras or extremely tight bras (sports bras).  Air-dry your nipples for 3-4 minutes after each feeding.  Use only cotton bra pads to absorb leaked breast milk. Leaking of breast milk between feedings is normal.  Use lanolin on your nipples after breastfeeding. Lanolin helps to maintain your skin's normal moisture barrier. Pure lanolin is not harmful (not toxic) to your baby. You may also hand express a few drops of breast milk and gently massage that milk into your nipples and allow the milk to air-dry. In the first few weeks after giving birth, some women experience breast engorgement. Engorgement can make your breasts feel heavy, warm, and tender to the touch. Engorgement peaks within 3-5 days after you give birth. The following recommendations can help to ease engorgement:  Completely empty your breasts while breastfeeding or pumping. You may want to start by applying warm, moist heat (in the shower or with warm, water-soaked hand towels) just before feeding or pumping. This increases circulation and helps the milk flow. If your baby does not completely empty your breasts while breastfeeding, pump any extra milk after he or she is finished.  Apply ice packs to your breasts immediately after breastfeeding or pumping, unless this is too uncomfortable for you. To do this: ? Put ice in a plastic bag. ? Place a towel between your skin and the bag. ? Leave the ice on for 20 minutes, 2-3 times a day.  Make sure that your baby is latched on and positioned properly while breastfeeding. If engorgement persists after 48 hours of following these recommendations, contact your health care provider or a lactation consultant. Overall health care recommendations while breastfeeding  Eat 3 healthy meals and 3 snacks every day. Well-nourished mothers who are breastfeeding need an additional 450-500 calories a day. You can meet this requirement by increasing the amount of a balanced diet that you eat.  Drink  enough water to keep your urine pale yellow or clear.  Rest often, relax, and continue to take your prenatal vitamins to prevent fatigue, stress, and low   vitamin and mineral levels in your body (nutrient deficiencies).  Do not use any products that contain nicotine or tobacco, such as cigarettes and e-cigarettes. Your baby may be harmed by chemicals from cigarettes that pass into breast milk and exposure to secondhand smoke. If you need help quitting, ask your health care provider.  Avoid alcohol.  Do not use illegal drugs or marijuana.  Talk with your health care provider before taking any medicines. These include over-the-counter and prescription medicines as well as vitamins and herbal supplements. Some medicines that may be harmful to your baby can pass through breast milk.  It is possible to become pregnant while breastfeeding. If birth control is desired, ask your health care provider about options that will be safe while breastfeeding your baby. Where to find more information: [employer]: www.llli.org Contact a health care provider if:  You feel like you want to stop breastfeeding or have become frustrated with breastfeeding.  Your nipples are cracked or bleeding.  Your breasts are red, tender, or warm.  You have: ? Painful breasts or nipples. ? A swollen area on either breast. ? A fever or chills. ? Nausea or vomiting. ? Drainage other than breast milk from your nipples.  Your breasts do not become full before feedings by the fifth day after you give birth.  You feel sad and depressed.  Your baby is: ? Too sleepy to eat well. ? Having trouble sleeping. ? More than 1 week old and wetting fewer than 6 diapers in a 24-hour period. ? Not gaining weight by 5 days of age.  Your baby has fewer than 3 stools in a 24-hour period.  Your baby's skin or the white parts of his or her eyes become yellow. Get help right away if:  Your baby is overly tired  (lethargic) and does not want to wake up and feed.  Your baby develops an unexplained fever. Summary  Breastfeeding offers many health benefits for infant and mothers.  Try to breastfeed your infant when he or she shows early signs of hunger.  Gently tickle or stroke your baby's lips with your finger or nipple to allow the baby to open his or her mouth. Bring the baby to your breast. Make sure that much of the areola is in your baby's mouth. Offer one side and burp the baby before you offer the other side.  Talk with your health care provider or lactation consultant if you have questions or you face problems as you breastfeed. This information is not intended to replace advice given to you by your health care provider. Make sure you discuss any questions you have with your health care provider. Document Revised: 02/13/2018 Document Reviewed: 12/21/2016 Elsevier Patient Education  2021 Elsevier Inc.  

## 2021-01-31 NOTE — Addendum Note (Signed)
Addended by: Henrietta Dine on: 01/31/2021 02:41 PM   Modules accepted: Orders

## 2021-01-31 NOTE — Progress Notes (Signed)
   Subjective:  Monica Vazquez is a 25 y.o. G2P0010 at [redacted]w[redacted]d being seen today for ongoing prenatal care.  She is currently monitored for the following issues for this high-risk pregnancy and has Supervision of high risk pregnancy, antepartum; Suboxone maintenance treatment complicating pregnancy, antepartum ([place]); Smoker; Anxiety; Maternal UTI (urinary tract infection), first trimester; Rubella non-immune status, antepartum; Cocaine abuse affecting pregnancy in first trimester ([place]); Marijuana use; and Abn chromsoml and genetic find on antenat screen of mother on their problem list.  Patient reports spotting that has resolved.  Contractions: Not present. Vag. Bleeding: Bloody Show.  Movement: Present. Denies leaking of fluid.   The following portions of the patient's history were reviewed and updated as appropriate: allergies, current medications, past family history, past medical history, past social history, past surgical history and problem list. Problem list updated.  Objective:   Vitals:   01/31/21 1355  BP: 124/81  Pulse: (!) 114  Weight: 193 lb 6.4 oz (87.7 kg)    Fetal Status: Fetal Heart Rate (bpm): 153 Fundal Height: 26 cm Movement: Present     General:  Alert, oriented and cooperative. Patient is in no acute distress.  Skin: Skin is warm and dry. No rash noted.   Cardiovascular: Normal heart rate noted  Respiratory: Normal respiratory effort, no problems with respiration noted  Abdomen: Soft, gravid, appropriate for gestational age. Pain/Pressure: Absent     Pelvic: Vag. Bleeding: Bloody Show     Cervical exam deferred        Extremities: Normal range of motion.  Edema: Trace  Mental Status: Normal mood and affect. Normal behavior. Normal judgment and thought content.   Urinalysis:      Assessment and Plan:  Pregnancy: G2P0010 at [redacted]w[redacted]d  1. Suboxone maintenance treatment complicating pregnancy, antepartum ([place]) Stable on suboxone $RemoveBef'8mg'EnqEqiDMOg$  BID, refill sent UDS today - Pain  Mgt Scrn (14 Drugs), Ur  2. Supervision of high risk pregnancy, antepartum BP and FHR normal Has 3rd trimester labs scheduled for next week, discussed importance of fasting Some spotting after intercourse that has now resolved, discussed this was likely due to cervical irritation during intercourse and was not concerning unless heavy or prolonged Reviewed warning precautions - CBC; Future - Glucose Tolerance, 2 Hours w/1 Hour; Future - RPR; Future  3. Smoker Still smoking a few cig a day, plans to stop using MJ first then try to quit tobacco  4. Rubella non-immune status, antepartum MMR at delivery  5. Cocaine abuse affecting pregnancy in first trimester [facility]) Discussed at length last visit (see prior note) Reports she is still using MJ as appetite stimulant but has cut down considerably Counseled again that her supply is likely laced with cocaine and I highly recommend stopping due to possible effects on pregnancy (IUGR, abruption, etc) She will continue to work on cutting down UDS today  Preterm labor symptoms and general obstetric precautions including but not limited to vaginal bleeding, contractions, leaking of fluid and fetal movement were reviewed in detail with the patient. Please refer to After Visit Summary for other counseling recommendations.  Return in 2 weeks (on 02/14/2021) for [facility], needs MD, 28 wk labs.   Clarnce Flock, MD

## 2021-02-01 LAB — PAIN MGT SCRN (14 DRUGS), UR
Amphetamine Scrn, Ur: NEGATIVE ng/mL
BARBITURATE SCREEN URINE: NEGATIVE ng/mL
BENZODIAZEPINE SCREEN, URINE: NEGATIVE ng/mL
Buprenorphine, Urine: POSITIVE ng/mL — AB
CANNABINOIDS UR QL SCN: POSITIVE ng/mL — AB
Cocaine (Metab) Scrn, Ur: NEGATIVE ng/mL
Creatinine(Crt), U: 33.7 mg/dL (ref 20.0–300.0)
Fentanyl, Urine: NEGATIVE pg/mL
Meperidine Screen, Urine: NEGATIVE ng/mL
Methadone Screen, Urine: NEGATIVE ng/mL
OXYCODONE+OXYMORPHONE UR QL SCN: NEGATIVE ng/mL
Opiate Scrn, Ur: NEGATIVE ng/mL
Ph of Urine: 7.4 (ref 4.5–8.9)
Phencyclidine Qn, Ur: NEGATIVE ng/mL
Propoxyphene Scrn, Ur: NEGATIVE ng/mL
Tramadol Screen, Urine: NEGATIVE ng/mL

## 2021-02-03 DIAGNOSIS — Z03818 Encounter for observation for suspected exposure to other biological agents ruled out: Secondary | ICD-10-CM | POA: Diagnosis not present

## 2021-02-07 ENCOUNTER — Other Ambulatory Visit: Payer: Self-pay

## 2021-02-07 ENCOUNTER — Other Ambulatory Visit: Payer: Medicaid Other

## 2021-02-07 DIAGNOSIS — O0990 Supervision of high risk pregnancy, unspecified, unspecified trimester: Secondary | ICD-10-CM

## 2021-02-07 NOTE — BH Specialist Note (Signed)
Pt did not arrive to video visit and did not answer the phone ; Left HIPPA-compliant message to call back Dosia Yodice from [facility] at [phone] (main office) or [phone] (Dezirea Mccollister's office).  ; left MyChart message for patient.      

## 2021-02-08 LAB — RPR: RPR Ser Ql: NONREACTIVE

## 2021-02-08 LAB — CBC
Hematocrit: 34.3 % (ref 34.0–46.6)
Hemoglobin: 10.9 g/dL — ABNORMAL LOW (ref 11.1–15.9)
MCH: 28 pg (ref 26.6–33.0)
MCHC: 31.8 g/dL (ref 31.5–35.7)
MCV: 88 fL (ref 79–97)
Platelets: 185 10*3/uL (ref 150–450)
RBC: 3.89 x10E6/uL (ref 3.77–5.28)
RDW: 13.2 % (ref 11.7–15.4)
WBC: 11.5 10*3/uL — ABNORMAL HIGH (ref 3.4–10.8)

## 2021-02-08 LAB — GLUCOSE TOLERANCE, 2 HOURS W/ 1HR
Glucose, 1 hour: 94 mg/dL (ref 65–179)
Glucose, 2 hour: 78 mg/dL (ref 65–152)
Glucose, Fasting: 82 mg/dL (ref 65–91)

## 2021-02-10 ENCOUNTER — Encounter: Payer: Self-pay | Admitting: *Deleted

## 2021-02-10 ENCOUNTER — Ambulatory Visit: Payer: Medicaid Other | Attending: Maternal & Fetal Medicine

## 2021-02-10 ENCOUNTER — Other Ambulatory Visit: Payer: Self-pay

## 2021-02-10 ENCOUNTER — Ambulatory Visit: Payer: Medicaid Other | Admitting: *Deleted

## 2021-02-10 DIAGNOSIS — Z3A28 28 weeks gestation of pregnancy: Secondary | ICD-10-CM

## 2021-02-10 DIAGNOSIS — Z148 Genetic carrier of other disease: Secondary | ICD-10-CM

## 2021-02-10 DIAGNOSIS — O0990 Supervision of high risk pregnancy, unspecified, unspecified trimester: Secondary | ICD-10-CM

## 2021-02-10 DIAGNOSIS — O99891 Other specified diseases and conditions complicating pregnancy: Secondary | ICD-10-CM | POA: Diagnosis present

## 2021-02-10 DIAGNOSIS — Z362 Encounter for other antenatal screening follow-up: Secondary | ICD-10-CM | POA: Diagnosis present

## 2021-02-10 DIAGNOSIS — F1120 Opioid dependence, uncomplicated: Secondary | ICD-10-CM

## 2021-02-10 DIAGNOSIS — O99333 Smoking (tobacco) complicating pregnancy, third trimester: Secondary | ICD-10-CM

## 2021-02-10 DIAGNOSIS — F1410 Cocaine abuse, uncomplicated: Secondary | ICD-10-CM

## 2021-02-10 DIAGNOSIS — O99323 Drug use complicating pregnancy, third trimester: Secondary | ICD-10-CM

## 2021-02-10 DIAGNOSIS — O2341 Unspecified infection of urinary tract in pregnancy, first trimester: Secondary | ICD-10-CM

## 2021-02-10 DIAGNOSIS — Z363 Encounter for antenatal screening for malformations: Secondary | ICD-10-CM | POA: Diagnosis not present

## 2021-02-10 DIAGNOSIS — Z283 Underimmunization status: Secondary | ICD-10-CM | POA: Insufficient documentation

## 2021-02-10 DIAGNOSIS — O99320 Drug use complicating pregnancy, unspecified trimester: Secondary | ICD-10-CM | POA: Insufficient documentation

## 2021-02-10 DIAGNOSIS — F1910 Other psychoactive substance abuse, uncomplicated: Secondary | ICD-10-CM

## 2021-02-10 DIAGNOSIS — F1290 Cannabis use, unspecified, uncomplicated: Secondary | ICD-10-CM | POA: Insufficient documentation

## 2021-02-10 DIAGNOSIS — O09899 Supervision of other high risk pregnancies, unspecified trimester: Secondary | ICD-10-CM

## 2021-02-10 DIAGNOSIS — O321XX0 Maternal care for breech presentation, not applicable or unspecified: Secondary | ICD-10-CM

## 2021-02-10 DIAGNOSIS — O99321 Drug use complicating pregnancy, first trimester: Secondary | ICD-10-CM | POA: Diagnosis present

## 2021-02-10 DIAGNOSIS — O358XX0 Maternal care for other (suspected) fetal abnormality and damage, not applicable or unspecified: Secondary | ICD-10-CM

## 2021-02-13 ENCOUNTER — Other Ambulatory Visit: Payer: Self-pay | Admitting: *Deleted

## 2021-02-13 DIAGNOSIS — O99323 Drug use complicating pregnancy, third trimester: Secondary | ICD-10-CM

## 2021-02-13 DIAGNOSIS — F1120 Opioid dependence, uncomplicated: Secondary | ICD-10-CM

## 2021-02-14 ENCOUNTER — Ambulatory Visit (INDEPENDENT_AMBULATORY_CARE_PROVIDER_SITE_OTHER): Payer: Medicaid Other | Admitting: Family Medicine

## 2021-02-14 ENCOUNTER — Other Ambulatory Visit: Payer: Self-pay

## 2021-02-14 VITALS — BP 98/80 | HR 116 | Wt 199.0 lb

## 2021-02-14 DIAGNOSIS — O99891 Other specified diseases and conditions complicating pregnancy: Secondary | ICD-10-CM

## 2021-02-14 DIAGNOSIS — F17200 Nicotine dependence, unspecified, uncomplicated: Secondary | ICD-10-CM

## 2021-02-14 DIAGNOSIS — O09899 Supervision of other high risk pregnancies, unspecified trimester: Secondary | ICD-10-CM

## 2021-02-14 DIAGNOSIS — Z283 Underimmunization status: Secondary | ICD-10-CM

## 2021-02-14 DIAGNOSIS — F1120 Opioid dependence, uncomplicated: Secondary | ICD-10-CM

## 2021-02-14 DIAGNOSIS — Z3480 Encounter for supervision of other normal pregnancy, unspecified trimester: Secondary | ICD-10-CM

## 2021-02-14 DIAGNOSIS — O0990 Supervision of high risk pregnancy, unspecified, unspecified trimester: Secondary | ICD-10-CM

## 2021-02-14 DIAGNOSIS — O99320 Drug use complicating pregnancy, unspecified trimester: Secondary | ICD-10-CM

## 2021-02-14 DIAGNOSIS — F1290 Cannabis use, unspecified, uncomplicated: Secondary | ICD-10-CM

## 2021-02-14 MED ORDER — FAMOTIDINE 20 MG PO TABS: 20.0000 mg | ORAL_TABLET | Freq: Every day | ORAL | 1 refills | Status: DC

## 2021-02-14 MED ORDER — BUPRENORPHINE HCL-NALOXONE HCL 8-2 MG SL FILM: 8.0000 mg | ORAL_FILM | Freq: Two times a day (BID) | SUBLINGUAL | 0 refills | Status: DC

## 2021-02-14 NOTE — Progress Notes (Signed)
° °  Subjective:  Monica Vazquez is a 25 y.o. G2P0010 at [redacted]w[redacted]d being seen today for ongoing prenatal care.  She is currently monitored for the following issues for this high-risk pregnancy and has Supervision of high risk pregnancy, antepartum; Suboxone maintenance treatment complicating pregnancy, antepartum ([place]); Smoker; Anxiety; Maternal UTI (urinary tract infection), first trimester; Rubella non-immune status, antepartum; Cocaine abuse affecting pregnancy in first trimester ([place]); Marijuana use; and Abn chromsoml and genetic find on antenat screen of mother on their problem list.   Patient reports fatigue.  Contractions: Not present. Vag. Bleeding: None.  Movement: Present. Denies leaking of fluid.   The following portions of the patient's history were reviewed and updated as appropriate: allergies, current medications, past family history, past medical history, past social history, past surgical history and problem list. Problem list updated.  Objective:   Vitals:   02/14/21 1440  BP: 98/80  Pulse: (!) 116  Weight: 199 lb (90.3 kg)    Fetal Status: Fetal Heart Rate (bpm): 140   Movement: Present     General:  Alert, oriented and cooperative. Patient is in no acute distress.  Skin: Skin is warm and dry. No rash noted.   Cardiovascular: Normal heart rate noted  Respiratory: Normal respiratory effort, no problems with respiration noted  Abdomen: Soft, gravid, appropriate for gestational age. Pain/Pressure: Present     Pelvic: Vag. Bleeding: None     Cervical exam deferred        Extremities: Normal range of motion.  Edema: Trace  Mental Status: Normal mood and affect. Normal behavior. Normal judgment and thought content.   Urinalysis:      Assessment and Plan:  Pregnancy: G2P0010 at [redacted]w[redacted]d  1. Supervision of high risk pregnancy, antepartum BP and FHR normal Last growth normal though borderline at 16%, following q4 weeks  2. Suboxone maintenance treatment complicating  pregnancy, antepartum ([place]) Stable on 8/8 Refill sent UDS today  3. Smoker Still smoking 7-8 cigs/day  4. Rubella non-immune status, antepartum MMR PP  5. Marijuana use Still smoking but trying to cutdown  Preterm labor symptoms and general obstetric precautions including but not limited to vaginal bleeding, contractions, leaking of fluid and fetal movement were reviewed in detail with the patient. Please refer to After Visit Summary for other counseling recommendations.  Return in 2 weeks (on 02/28/2021).   Clarnce Flock, MD

## 2021-02-14 NOTE — Patient Instructions (Signed)
 Contraception Choices Contraception, also called birth control, refers to methods or devices that prevent pregnancy. Hormonal methods Contraceptive implant A contraceptive implant is a thin, plastic tube that contains a hormone that prevents pregnancy. It is different from an intrauterine device (IUD). It is inserted into the upper part of the arm by a health care provider. Implants can be effective for up to 3 years. Progestin-only injections Progestin-only injections are injections of progestin, a synthetic form of the hormone progesterone. They are given every 3 months by a health care provider. Birth control pills Birth control pills are pills that contain hormones that prevent pregnancy. They must be taken once a day, preferably at the same time each day. A prescription is needed to use this method of contraception. Birth control patch The birth control patch contains hormones that prevent pregnancy. It is placed on the skin and must be changed once a week for three weeks and removed on the fourth week. A prescription is needed to use this method of contraception. Vaginal ring A vaginal ring contains hormones that prevent pregnancy. It is placed in the vagina for three weeks and removed on the fourth week. After that, the process is repeated with a new ring. A prescription is needed to use this method of contraception. Emergency contraceptive Emergency contraceptives prevent pregnancy after unprotected sex. They come in pill form and can be taken up to 5 days after sex. They work best the sooner they are taken after having sex. Most emergency contraceptives are available without a prescription. This method should not be used as your only form of birth control.   Barrier methods Female condom A female condom is a thin sheath that is worn over the penis during sex. Condoms keep sperm from going inside a woman's body. They can be used with a sperm-killing substance (spermicide) to increase their  effectiveness. They should be thrown away after one use. Female condom A female condom is a soft, loose-fitting sheath that is put into the vagina before sex. The condom keeps sperm from going inside a woman's body. They should be thrown away after one use. Diaphragm A diaphragm is a soft, dome-shaped barrier. It is inserted into the vagina before sex, along with a spermicide. The diaphragm blocks sperm from entering the uterus, and the spermicide kills sperm. A diaphragm should be left in the vagina for 6-8 hours after sex and removed within 24 hours. A diaphragm is prescribed and fitted by a health care provider. A diaphragm should be replaced every 1-2 years, after giving birth, after gaining more than 15 lb (6.8 kg), and after pelvic surgery. Cervical cap A cervical cap is a round, soft latex or plastic cup that fits over the cervix. It is inserted into the vagina before sex, along with spermicide. It blocks sperm from entering the uterus. The cap should be left in place for 6-8 hours after sex and removed within 48 hours. A cervical cap must be prescribed and fitted by a health care provider. It should be replaced every 2 years. Sponge A sponge is a soft, circular piece of polyurethane foam with spermicide in it. The sponge helps block sperm from entering the uterus, and the spermicide kills sperm. To use it, you make it wet and then insert it into the vagina. It should be inserted before sex, left in for at least 6 hours after sex, and removed and thrown away within 30 hours. Spermicides Spermicides are chemicals that kill or block sperm from entering the   cervix and uterus. They can come as a cream, jelly, suppository, foam, or tablet. A spermicide should be inserted into the vagina with an applicator at least 10-15 minutes before sex to allow time for it to work. The process must be repeated every time you have sex. Spermicides do not require a prescription.   Intrauterine  contraception Intrauterine device (IUD) An IUD is a T-shaped device that is put in a woman's uterus. There are two types:  Hormone IUD.This type contains progestin, a synthetic form of the hormone progesterone. This type can stay in place for 3-5 years.  Copper IUD.This type is wrapped in copper wire. It can stay in place for 10 years. Permanent methods of contraception Female tubal ligation In this method, a woman's fallopian tubes are sealed, tied, or blocked during surgery to prevent eggs from traveling to the uterus. Hysteroscopic sterilization In this method, a small, flexible insert is placed into each fallopian tube. The inserts cause scar tissue to form in the fallopian tubes and block them, so sperm cannot reach an egg. The procedure takes about 3 months to be effective. Another form of birth control must be used during those 3 months. Female sterilization This is a procedure to tie off the tubes that carry sperm (vasectomy). After the procedure, the man can still ejaculate fluid (semen). Another form of birth control must be used for 3 months after the procedure. Natural planning methods Natural family planning In this method, a couple does not have sex on days when the woman could become pregnant. Calendar method In this method, the woman keeps track of the length of each menstrual cycle, identifies the days when pregnancy can happen, and does not have sex on those days. Ovulation method In this method, a couple avoids sex during ovulation. Symptothermal method This method involves not having sex during ovulation. The woman typically checks for ovulation by watching changes in her temperature and in the consistency of cervical mucus. Post-ovulation method In this method, a couple waits to have sex until after ovulation. Where to find more information  Centers for Disease Control and Prevention: www.cdc.gov Summary  Contraception, also called birth control, refers to methods or  devices that prevent pregnancy.  Hormonal methods of contraception include implants, injections, pills, patches, vaginal rings, and emergency contraceptives.  Barrier methods of contraception can include female condoms, female condoms, diaphragms, cervical caps, sponges, and spermicides.  There are two types of IUDs (intrauterine devices). An IUD can be put in a woman's uterus to prevent pregnancy for 3-5 years.  Permanent sterilization can be done through a procedure for males and females. Natural family planning methods involve nothaving sex on days when the woman could become pregnant. This information is not intended to replace advice given to you by your health care provider. Make sure you discuss any questions you have with your health care provider. Document Revised: 04/25/2020 Document Reviewed: 04/25/2020 Elsevier Patient Education  2021 Elsevier Inc.   Breastfeeding  Choosing to breastfeed is one of the best decisions you can make for yourself and your baby. A change in hormones during pregnancy causes your breasts to make breast milk in your milk-producing glands. Hormones prevent breast milk from being released before your baby is born. They also prompt milk flow after birth. Once breastfeeding has begun, thoughts of your baby, as well as his or her sucking or crying, can stimulate the release of milk from your milk-producing glands. Benefits of breastfeeding Research shows that breastfeeding offers many health benefits   for infants and mothers. It also offers a cost-free and convenient way to feed your baby. For your baby  Your first milk (colostrum) helps your baby's digestive system to function better.  Special cells in your milk (antibodies) help your baby to fight off infections.  Breastfed babies are less likely to develop asthma, allergies, obesity, or type 2 diabetes. They are also at lower risk for sudden infant death syndrome (SIDS).  Nutrients in breast milk are better  able to meet your baby's needs compared to infant formula.  Breast milk improves your baby's brain development. For you  Breastfeeding helps to create a very special bond between you and your baby.  Breastfeeding is convenient. Breast milk costs nothing and is always available at the correct temperature.  Breastfeeding helps to burn calories. It helps you to lose the weight that you gained during pregnancy.  Breastfeeding makes your uterus return faster to its size before pregnancy. It also slows bleeding (lochia) after you give birth.  Breastfeeding helps to lower your risk of developing type 2 diabetes, osteoporosis, rheumatoid arthritis, cardiovascular disease, and breast, ovarian, uterine, and endometrial cancer later in life. Breastfeeding basics Starting breastfeeding  Find a comfortable place to sit or lie down, with your neck and back well-supported.  Place a pillow or a rolled-up blanket under your baby to bring him or her to the level of your breast (if you are seated). Nursing pillows are specially designed to help support your arms and your baby while you breastfeed.  Make sure that your baby's tummy (abdomen) is facing your abdomen.  Gently massage your breast. With your fingertips, massage from the outer edges of your breast inward toward the nipple. This encourages milk flow. If your milk flows slowly, you may need to continue this action during the feeding.  Support your breast with 4 fingers underneath and your thumb above your nipple (make the letter "C" with your hand). Make sure your fingers are well away from your nipple and your baby's mouth.  Stroke your baby's lips gently with your finger or nipple.  When your baby's mouth is open wide enough, quickly bring your baby to your breast, placing your entire nipple and as much of the areola as possible into your baby's mouth. The areola is the colored area around your nipple. ? More areola should be visible above your  baby's upper lip than below the lower lip. ? Your baby's lips should be opened and extended outward (flanged) to ensure an adequate, comfortable latch. ? Your baby's tongue should be between his or her lower gum and your breast.  Make sure that your baby's mouth is correctly positioned around your nipple (latched). Your baby's lips should create a seal on your breast and be turned out (everted).  It is common for your baby to suck about 2-3 minutes in order to start the flow of breast milk. Latching Teaching your baby how to latch onto your breast properly is very important. An improper latch can cause nipple pain, decreased milk supply, and poor weight gain in your baby. Also, if your baby is not latched onto your nipple properly, he or she may swallow some air during feeding. This can make your baby fussy. Burping your baby when you switch breasts during the feeding can help to get rid of the air. However, teaching your baby to latch on properly is still the best way to prevent fussiness from swallowing air while breastfeeding. Signs that your baby has successfully latched onto   your nipple  Silent tugging or silent sucking, without causing you pain. Infant's lips should be extended outward (flanged).  Swallowing heard between every 3-4 sucks once your milk has started to flow (after your let-down milk reflex occurs).  Muscle movement above and in front of his or her ears while sucking. Signs that your baby has not successfully latched onto your nipple  Sucking sounds or smacking sounds from your baby while breastfeeding.  Nipple pain. If you think your baby has not latched on correctly, slip your finger into the corner of your baby's mouth to break the suction and place it between your baby's gums. Attempt to start breastfeeding again. Signs of successful breastfeeding Signs from your baby  Your baby will gradually decrease the number of sucks or will completely stop sucking.  Your baby  will fall asleep.  Your baby's body will relax.  Your baby will retain a small amount of milk in his or her mouth.  Your baby will let go of your breast by himself or herself. Signs from you  Breasts that have increased in firmness, weight, and size 1-3 hours after feeding.  Breasts that are softer immediately after breastfeeding.  Increased milk volume, as well as a change in milk consistency and color by the fifth day of breastfeeding.  Nipples that are not sore, cracked, or bleeding. Signs that your baby is getting enough milk  Wetting at least 1-2 diapers during the first 24 hours after birth.  Wetting at least 5-6 diapers every 24 hours for the first week after birth. The urine should be clear or pale yellow by the age of 5 days.  Wetting 6-8 diapers every 24 hours as your baby continues to grow and develop.  At least 3 stools in a 24-hour period by the age of 5 days. The stool should be soft and yellow.  At least 3 stools in a 24-hour period by the age of 7 days. The stool should be seedy and yellow.  No loss of weight greater than 10% of birth weight during the first 3 days of life.  Average weight gain of 4-7 oz (113-198 g) per week after the age of 4 days.  Consistent daily weight gain by the age of 5 days, without weight loss after the age of 2 weeks. After a feeding, your baby may spit up a small amount of milk. This is normal. Breastfeeding frequency and duration Frequent feeding will help you make more milk and can prevent sore nipples and extremely full breasts (breast engorgement). Breastfeed when you feel the need to reduce the fullness of your breasts or when your baby shows signs of hunger. This is called "breastfeeding on demand." Signs that your baby is hungry include:  Increased alertness, activity, or restlessness.  Movement of the head from side to side.  Opening of the mouth when the corner of the mouth or cheek is stroked (rooting).  Increased  sucking sounds, smacking lips, cooing, sighing, or squeaking.  Hand-to-mouth movements and sucking on fingers or hands.  Fussing or crying. Avoid introducing a pacifier to your baby in the first 4-6 weeks after your baby is born. After this time, you may choose to use a pacifier. Research has shown that pacifier use during the first year of a baby's life decreases the risk of sudden infant death syndrome (SIDS). Allow your baby to feed on each breast as long as he or she wants. When your baby unlatches or falls asleep while feeding from the   first breast, offer the second breast. Because newborns are often sleepy in the first few weeks of life, you may need to awaken your baby to get him or her to feed. Breastfeeding times will vary from baby to baby. However, the following rules can serve as a guide to help you make sure that your baby is properly fed:  Newborns (babies 4 weeks of age or younger) may breastfeed every 1-3 hours.  Newborns should not go without breastfeeding for longer than 3 hours during the day or 5 hours during the night.  You should breastfeed your baby a minimum of 8 times in a 24-hour period. Breast milk pumping Pumping and storing breast milk allows you to make sure that your baby is exclusively fed your breast milk, even at times when you are unable to breastfeed. This is especially important if you go back to work while you are still breastfeeding, or if you are not able to be present during feedings. Your lactation consultant can help you find a method of pumping that works best for you and give you guidelines about how long it is safe to store breast milk.      Caring for your breasts while you breastfeed Nipples can become dry, cracked, and sore while breastfeeding. The following recommendations can help keep your breasts moisturized and healthy:  Avoid using soap on your nipples.  Wear a supportive bra designed especially for nursing. Avoid wearing underwire-style  bras or extremely tight bras (sports bras).  Air-dry your nipples for 3-4 minutes after each feeding.  Use only cotton bra pads to absorb leaked breast milk. Leaking of breast milk between feedings is normal.  Use lanolin on your nipples after breastfeeding. Lanolin helps to maintain your skin's normal moisture barrier. Pure lanolin is not harmful (not toxic) to your baby. You may also hand express a few drops of breast milk and gently massage that milk into your nipples and allow the milk to air-dry. In the first few weeks after giving birth, some women experience breast engorgement. Engorgement can make your breasts feel heavy, warm, and tender to the touch. Engorgement peaks within 3-5 days after you give birth. The following recommendations can help to ease engorgement:  Completely empty your breasts while breastfeeding or pumping. You may want to start by applying warm, moist heat (in the shower or with warm, water-soaked hand towels) just before feeding or pumping. This increases circulation and helps the milk flow. If your baby does not completely empty your breasts while breastfeeding, pump any extra milk after he or she is finished.  Apply ice packs to your breasts immediately after breastfeeding or pumping, unless this is too uncomfortable for you. To do this: ? Put ice in a plastic bag. ? Place a towel between your skin and the bag. ? Leave the ice on for 20 minutes, 2-3 times a day.  Make sure that your baby is latched on and positioned properly while breastfeeding. If engorgement persists after 48 hours of following these recommendations, contact your health care provider or a lactation consultant. Overall health care recommendations while breastfeeding  Eat 3 healthy meals and 3 snacks every day. Well-nourished mothers who are breastfeeding need an additional 450-500 calories a day. You can meet this requirement by increasing the amount of a balanced diet that you eat.  Drink  enough water to keep your urine pale yellow or clear.  Rest often, relax, and continue to take your prenatal vitamins to prevent fatigue, stress, and low   vitamin and mineral levels in your body (nutrient deficiencies).  Do not use any products that contain nicotine or tobacco, such as cigarettes and e-cigarettes. Your baby may be harmed by chemicals from cigarettes that pass into breast milk and exposure to secondhand smoke. If you need help quitting, ask your health care provider.  Avoid alcohol.  Do not use illegal drugs or marijuana.  Talk with your health care provider before taking any medicines. These include over-the-counter and prescription medicines as well as vitamins and herbal supplements. Some medicines that may be harmful to your baby can pass through breast milk.  It is possible to become pregnant while breastfeeding. If birth control is desired, ask your health care provider about options that will be safe while breastfeeding your baby. Where to find more information: [employer]: www.llli.org Contact a health care provider if:  You feel like you want to stop breastfeeding or have become frustrated with breastfeeding.  Your nipples are cracked or bleeding.  Your breasts are red, tender, or warm.  You have: ? Painful breasts or nipples. ? A swollen area on either breast. ? A fever or chills. ? Nausea or vomiting. ? Drainage other than breast milk from your nipples.  Your breasts do not become full before feedings by the fifth day after you give birth.  You feel sad and depressed.  Your baby is: ? Too sleepy to eat well. ? Having trouble sleeping. ? More than 1 week old and wetting fewer than 6 diapers in a 24-hour period. ? Not gaining weight by 5 days of age.  Your baby has fewer than 3 stools in a 24-hour period.  Your baby's skin or the white parts of his or her eyes become yellow. Get help right away if:  Your baby is overly tired  (lethargic) and does not want to wake up and feed.  Your baby develops an unexplained fever. Summary  Breastfeeding offers many health benefits for infant and mothers.  Try to breastfeed your infant when he or she shows early signs of hunger.  Gently tickle or stroke your baby's lips with your finger or nipple to allow the baby to open his or her mouth. Bring the baby to your breast. Make sure that much of the areola is in your baby's mouth. Offer one side and burp the baby before you offer the other side.  Talk with your health care provider or lactation consultant if you have questions or you face problems as you breastfeed. This information is not intended to replace advice given to you by your health care provider. Make sure you discuss any questions you have with your health care provider. Document Revised: 02/13/2018 Document Reviewed: 12/21/2016 Elsevier Patient Education  2021 Elsevier Inc.  

## 2021-02-15 LAB — PAIN MGT SCRN (14 DRUGS), UR
Amphetamine Scrn, Ur: NEGATIVE ng/mL
BARBITURATE SCREEN URINE: NEGATIVE ng/mL
BENZODIAZEPINE SCREEN, URINE: NEGATIVE ng/mL
Buprenorphine, Urine: POSITIVE ng/mL — AB
CANNABINOIDS UR QL SCN: POSITIVE ng/mL — AB
Cocaine (Metab) Scrn, Ur: POSITIVE ng/mL — AB
Creatinine(Crt), U: 58.6 mg/dL (ref 20.0–300.0)
Fentanyl, Urine: NEGATIVE pg/mL
Meperidine Screen, Urine: NEGATIVE ng/mL
Methadone Screen, Urine: NEGATIVE ng/mL
OXYCODONE+OXYMORPHONE UR QL SCN: NEGATIVE ng/mL
Opiate Scrn, Ur: NEGATIVE ng/mL
Ph of Urine: 6.8 (ref 4.5–8.9)
Phencyclidine Qn, Ur: NEGATIVE ng/mL
Propoxyphene Scrn, Ur: NEGATIVE ng/mL
Tramadol Screen, Urine: NEGATIVE ng/mL

## 2021-02-16 ENCOUNTER — Ambulatory Visit: Payer: HRSA Program | Admitting: Clinical

## 2021-02-16 DIAGNOSIS — Z5329 Procedure and treatment not carried out because of patient's decision for other reasons: Secondary | ICD-10-CM

## 2021-02-16 DIAGNOSIS — Z91199 Patient's noncompliance with other medical treatment and regimen due to unspecified reason: Secondary | ICD-10-CM

## 2021-02-16 DIAGNOSIS — F1120 Opioid dependence, uncomplicated: Secondary | ICD-10-CM

## 2021-02-16 DIAGNOSIS — O99320 Drug use complicating pregnancy, unspecified trimester: Secondary | ICD-10-CM

## 2021-02-17 DIAGNOSIS — Z03818 Encounter for observation for suspected exposure to other biological agents ruled out: Secondary | ICD-10-CM | POA: Diagnosis not present

## 2021-02-20 IMAGING — CT CT HEAD W/O CM
3 series · 15 of 47 positions shown, 18 images · non-contrast
Comparison: August 21, 2017

CLINICAL DATA: Seizure

EXAM:
CT HEAD WITHOUT CONTRAST
TECHNIQUE: Contiguous axial images were obtained from the base of the skull
through the vertex without intravenous contrast.

[Series 2: head trauma wo · axial · 0.40mm/px · z∈[+1422,+1547]mm · 9 of 30 slices shown, 12 images]
[im 3/30  brain]
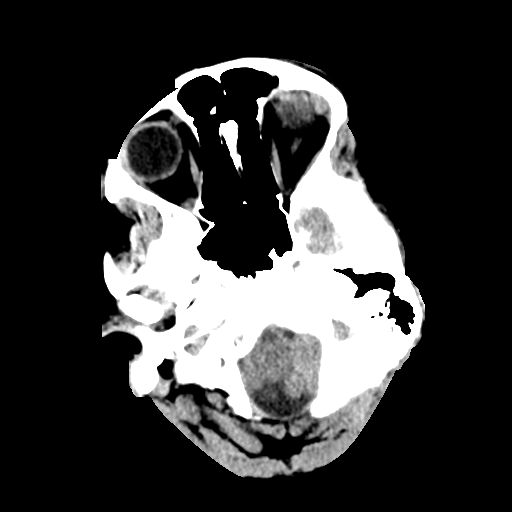
[im 3/30  bone]
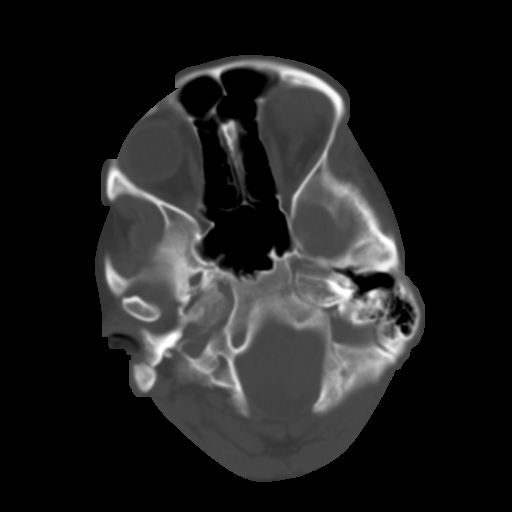
[im 6/30  brain]
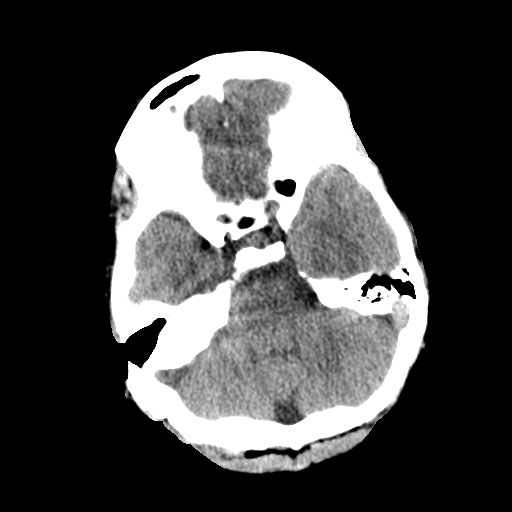
[im 9/30  brain]
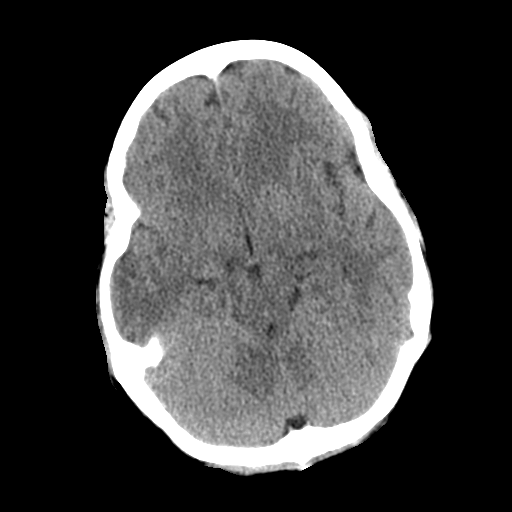
[im 12/30  brain]
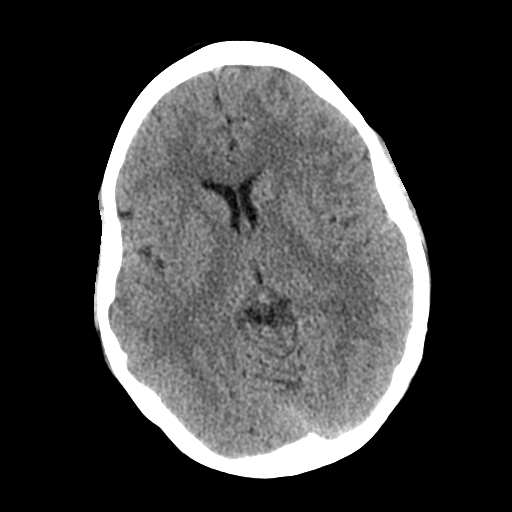
[im 16/30  brain]
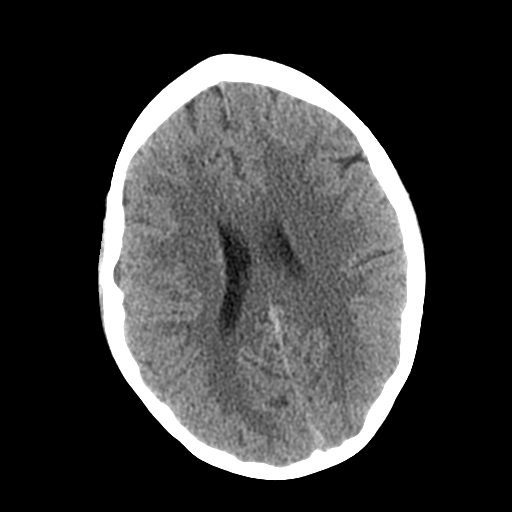
[im 16/30  bone]
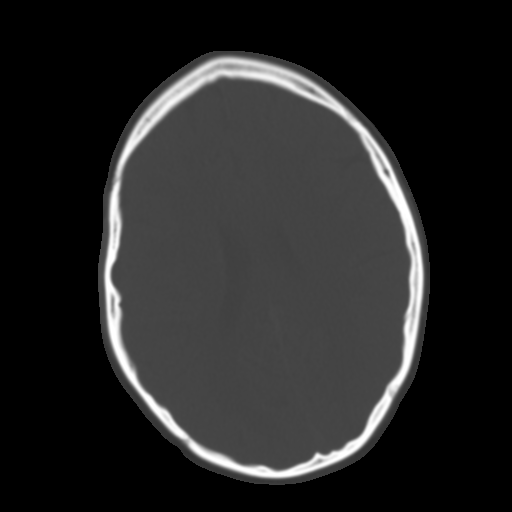
[im 19/30  brain]
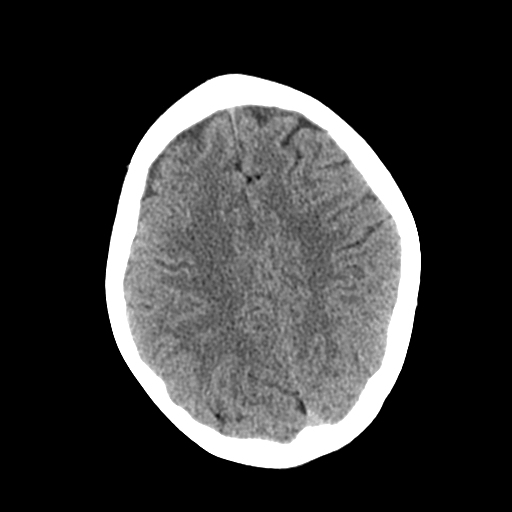
[im 22/30  brain]
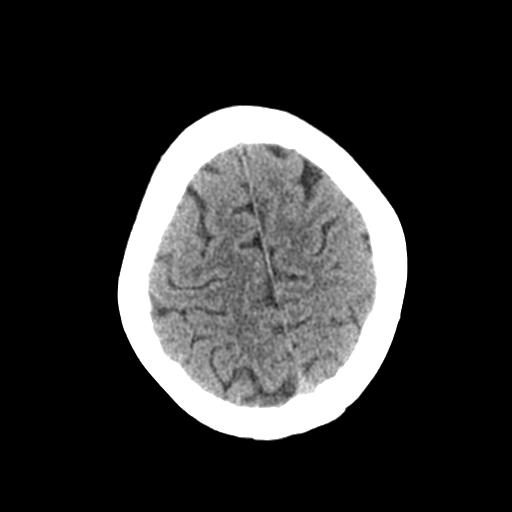
[im 25/30  brain]
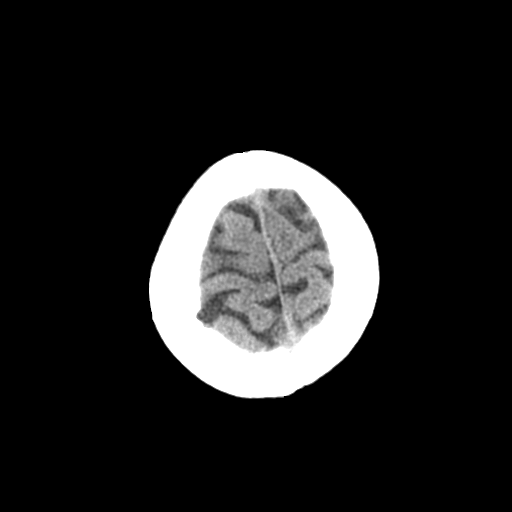
[im 28/30  brain]
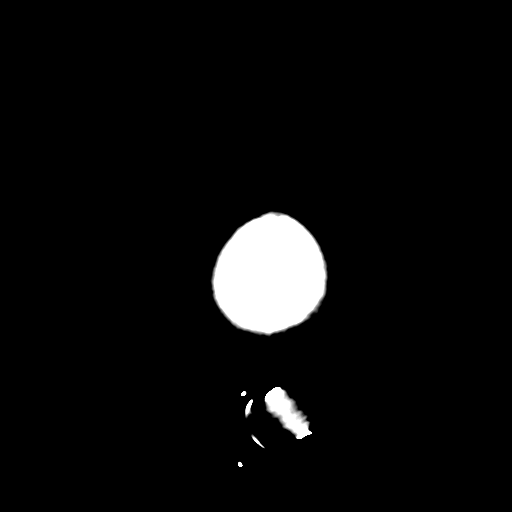
[im 28/30  bone]
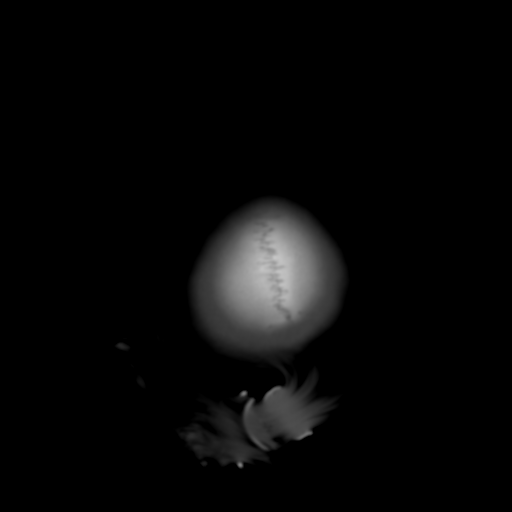

[Series 4: coronal soft tissue · coronal · 0.30mm/px · 3 of 64 slices shown]
[im 22/64  brain]
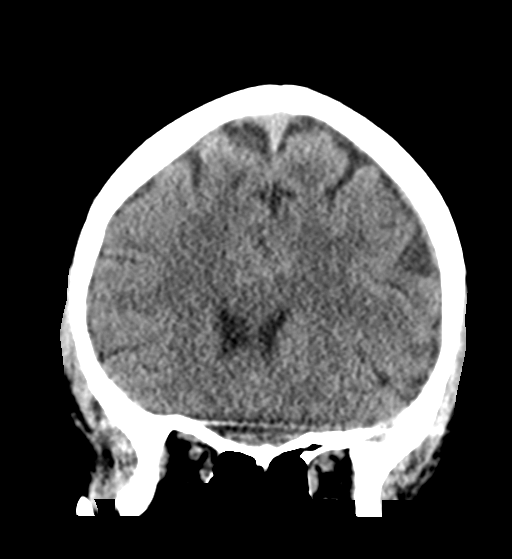
[im 29/64  brain]
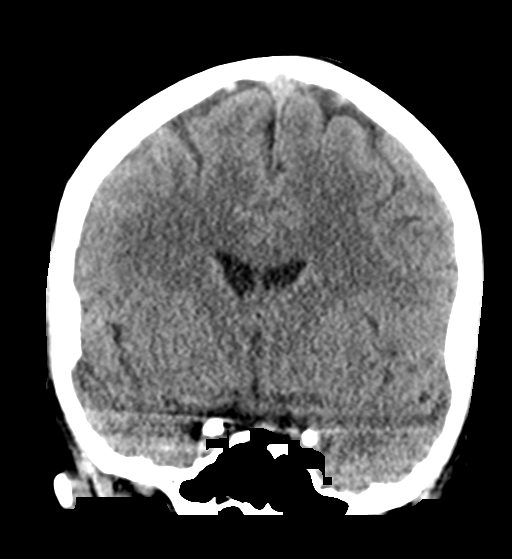
[im 36/64  brain]
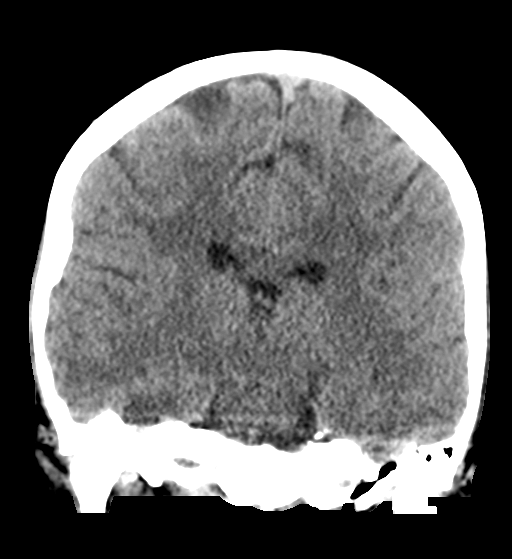

[Series 5: sagittal soft tissue · sagittal · 0.29mm/px · 3 of 55 slices shown]
[im 19/55  brain]
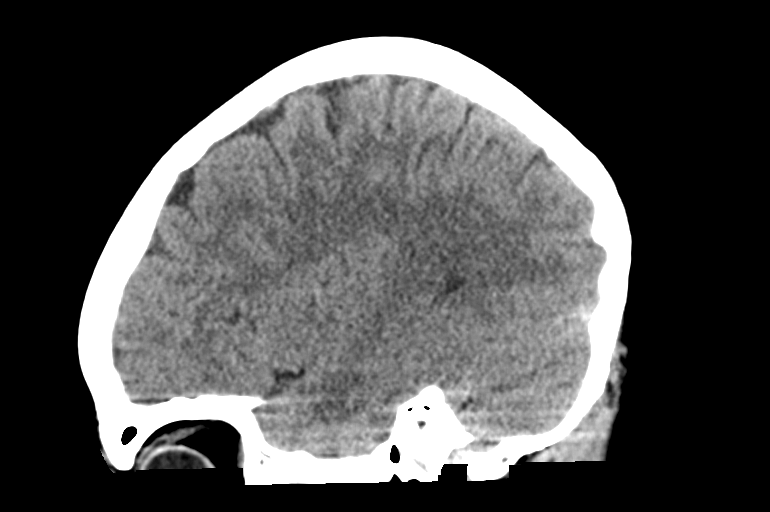
[im 28/55  brain]
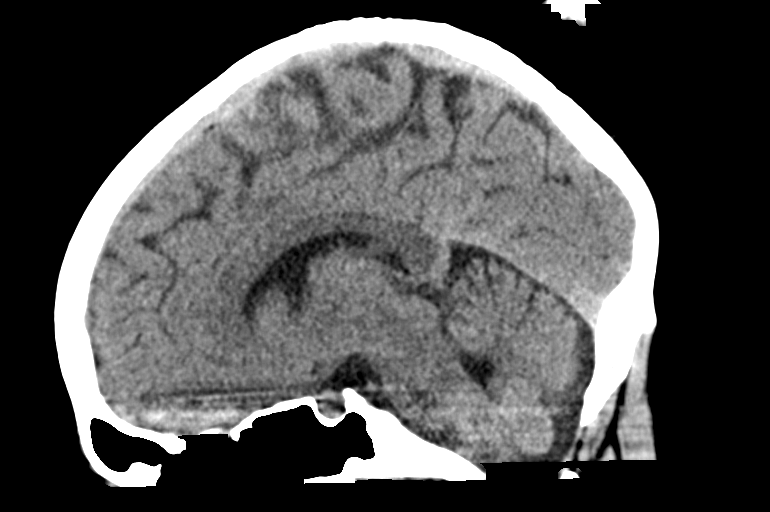
[im 37/55  brain]
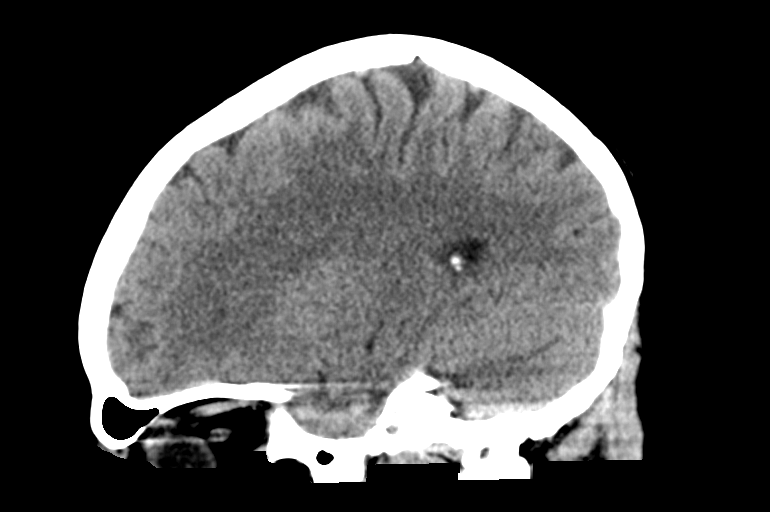

[15 of 47 positions shown; findings below may reference images not displayed]

FINDINGS: Brain: The ventricles are normal in size and configuration. There is
no intracranial mass, hemorrhage, extra-axial fluid collection, or
midline shift. Brain parenchyma appears normal. No demonstrable
acute infarct.

Vascular: No hyperdense vessel. There is no appreciable vascular
calcification.

Skull: The bony calvarium appears intact.

Sinuses/Orbits: There is mucosal thickening in several ethmoid air
cells. Other visualized paranasal sinuses are clear. Visualized
orbits appear symmetric bilaterally.

Other: Mastoids on the left are clear. The patient has had a
previous mastoidectomy on the right. There is a radiopaque foreign
body in the right external auditory canal.
IMPRESSION: Brain parenchyma appears unremarkable.  No mass or hemorrhage.

Status post right mastoidectomy. Radiopaque foreign body in the
right external auditory canal. There is mucosal thickening in
several ethmoid air cells.

## 2021-02-28 ENCOUNTER — Ambulatory Visit (INDEPENDENT_AMBULATORY_CARE_PROVIDER_SITE_OTHER): Payer: Medicaid Other | Admitting: Family Medicine

## 2021-02-28 ENCOUNTER — Encounter: Payer: Self-pay | Admitting: Family Medicine

## 2021-02-28 ENCOUNTER — Other Ambulatory Visit: Payer: Self-pay

## 2021-02-28 VITALS — BP 106/72 | HR 109 | Wt 195.7 lb

## 2021-02-28 DIAGNOSIS — Z2839 Other underimmunization status: Secondary | ICD-10-CM

## 2021-02-28 DIAGNOSIS — Z283 Underimmunization status: Secondary | ICD-10-CM

## 2021-02-28 DIAGNOSIS — O2341 Unspecified infection of urinary tract in pregnancy, first trimester: Secondary | ICD-10-CM

## 2021-02-28 DIAGNOSIS — O99320 Drug use complicating pregnancy, unspecified trimester: Secondary | ICD-10-CM

## 2021-02-28 DIAGNOSIS — F1120 Opioid dependence, uncomplicated: Secondary | ICD-10-CM

## 2021-02-28 DIAGNOSIS — F17200 Nicotine dependence, unspecified, uncomplicated: Secondary | ICD-10-CM

## 2021-02-28 DIAGNOSIS — O0990 Supervision of high risk pregnancy, unspecified, unspecified trimester: Secondary | ICD-10-CM | POA: Diagnosis not present

## 2021-02-28 DIAGNOSIS — O99891 Other specified diseases and conditions complicating pregnancy: Secondary | ICD-10-CM

## 2021-02-28 DIAGNOSIS — F419 Anxiety disorder, unspecified: Secondary | ICD-10-CM

## 2021-02-28 MED ORDER — BUPRENORPHINE HCL-NALOXONE HCL 8-2 MG SL FILM
8.0000 mg | ORAL_FILM | Freq: Two times a day (BID) | SUBLINGUAL | 0 refills | Status: DC
Start: 2021-02-28 — End: 2021-03-14

## 2021-02-28 NOTE — Progress Notes (Signed)
   Subjective:  Monica Vazquez is a 25 y.o. G2P0010 at [redacted]w[redacted]d being seen today for ongoing prenatal care.  She is currently monitored for the following issues for this high-risk pregnancy and has Supervision of high risk pregnancy, antepartum; Suboxone maintenance treatment complicating pregnancy, antepartum (HCC); Smoker; Anxiety; Maternal UTI (urinary tract infection), first trimester; Rubella non-immune status, antepartum; Cocaine abuse affecting pregnancy in first trimester (HCC); Marijuana use; and Abn chromsoml and genetic find on antenat screen of mother on their problem list.  Patient reports no complaints.  Contractions: Not present. Vag. Bleeding: None.  Movement: Present. Denies leaking of fluid.   The following portions of the patient's history were reviewed and updated as appropriate: allergies, current medications, past family history, past medical history, past social history, past surgical history and problem list. Problem list updated.  Objective:   Vitals:   02/28/21 1633  BP: 106/72  Pulse: (!) 109  Weight: 195 lb 11.2 oz (88.8 kg)    Fetal Status: Fetal Heart Rate (bpm): 150   Movement: Present     General:  Alert, oriented and cooperative. Patient is in no acute distress.  Skin: Skin is warm and dry. No rash noted.   Cardiovascular: Normal heart rate noted  Respiratory: Normal respiratory effort, no problems with respiration noted  Abdomen: Soft, gravid, appropriate for gestational age. Pain/Pressure: Present     Pelvic: Vag. Bleeding: None     Cervical exam deferred        Extremities: Normal range of motion.  Edema: Trace  Mental Status: Normal mood and affect. Normal behavior. Normal judgment and thought content.   Urinalysis:      Assessment and Plan:  Pregnancy: G2P0010 at [redacted]w[redacted]d  1. Suboxone maintenance treatment complicating pregnancy, antepartum (HCC) Last UDS w +cocaine, likely secondary to laced MJ as patient denies use Have counseled on this  extensively in the past and reports that she quit smoking MJ about five days ago! Congratulated on her efforts Adherent with suboxone 8 BID, symptoms well controlled UDS collected with patients consent - Pain Mgt Scrn (14 Drugs), Ur  2. Supervision of high risk pregnancy, antepartum HIV today, inadvertently not collected last visit BP and FHR normal FH 30 cm - HIV antibody (with reflex)  3. Rubella non-immune status, antepartum   4. Anxiety   5. Smoker Continues to smoke cigarettes, encouraged to cut down as she is able   Preterm labor symptoms and general obstetric precautions including but not limited to vaginal bleeding, contractions, leaking of fluid and fetal movement were reviewed in detail with the patient. Please refer to After Visit Summary for other counseling recommendations.  Return in 2 weeks (on 03/14/2021) for [facility], ob visit, needs MD.   Venora Maples, MD

## 2021-02-28 NOTE — Patient Instructions (Signed)
 Contraception Choices Contraception, also called birth control, refers to methods or devices that prevent pregnancy. Hormonal methods Contraceptive implant A contraceptive implant is a thin, plastic tube that contains a hormone that prevents pregnancy. It is different from an intrauterine device (IUD). It is inserted into the upper part of the arm by a health care provider. Implants can be effective for up to 3 years. Progestin-only injections Progestin-only injections are injections of progestin, a synthetic form of the hormone progesterone. They are given every 3 months by a health care provider. Birth control pills Birth control pills are pills that contain hormones that prevent pregnancy. They must be taken once a day, preferably at the same time each day. A prescription is needed to use this method of contraception. Birth control patch The birth control patch contains hormones that prevent pregnancy. It is placed on the skin and must be changed once a week for three weeks and removed on the fourth week. A prescription is needed to use this method of contraception. Vaginal ring A vaginal ring contains hormones that prevent pregnancy. It is placed in the vagina for three weeks and removed on the fourth week. After that, the process is repeated with a new ring. A prescription is needed to use this method of contraception. Emergency contraceptive Emergency contraceptives prevent pregnancy after unprotected sex. They come in pill form and can be taken up to 5 days after sex. They work best the sooner they are taken after having sex. Most emergency contraceptives are available without a prescription. This method should not be used as your only form of birth control.   Barrier methods Female condom A female condom is a thin sheath that is worn over the penis during sex. Condoms keep sperm from going inside a woman's body. They can be used with a sperm-killing substance (spermicide) to increase their  effectiveness. They should be thrown away after one use. Female condom A female condom is a soft, loose-fitting sheath that is put into the vagina before sex. The condom keeps sperm from going inside a woman's body. They should be thrown away after one use. Diaphragm A diaphragm is a soft, dome-shaped barrier. It is inserted into the vagina before sex, along with a spermicide. The diaphragm blocks sperm from entering the uterus, and the spermicide kills sperm. A diaphragm should be left in the vagina for 6-8 hours after sex and removed within 24 hours. A diaphragm is prescribed and fitted by a health care provider. A diaphragm should be replaced every 1-2 years, after giving birth, after gaining more than 15 lb (6.8 kg), and after pelvic surgery. Cervical cap A cervical cap is a round, soft latex or plastic cup that fits over the cervix. It is inserted into the vagina before sex, along with spermicide. It blocks sperm from entering the uterus. The cap should be left in place for 6-8 hours after sex and removed within 48 hours. A cervical cap must be prescribed and fitted by a health care provider. It should be replaced every 2 years. Sponge A sponge is a soft, circular piece of polyurethane foam with spermicide in it. The sponge helps block sperm from entering the uterus, and the spermicide kills sperm. To use it, you make it wet and then insert it into the vagina. It should be inserted before sex, left in for at least 6 hours after sex, and removed and thrown away within 30 hours. Spermicides Spermicides are chemicals that kill or block sperm from entering the   cervix and uterus. They can come as a cream, jelly, suppository, foam, or tablet. A spermicide should be inserted into the vagina with an applicator at least 10-15 minutes before sex to allow time for it to work. The process must be repeated every time you have sex. Spermicides do not require a prescription.   Intrauterine  contraception Intrauterine device (IUD) An IUD is a T-shaped device that is put in a woman's uterus. There are two types:  Hormone IUD.This type contains progestin, a synthetic form of the hormone progesterone. This type can stay in place for 3-5 years.  Copper IUD.This type is wrapped in copper wire. It can stay in place for 10 years. Permanent methods of contraception Female tubal ligation In this method, a woman's fallopian tubes are sealed, tied, or blocked during surgery to prevent eggs from traveling to the uterus. Hysteroscopic sterilization In this method, a small, flexible insert is placed into each fallopian tube. The inserts cause scar tissue to form in the fallopian tubes and block them, so sperm cannot reach an egg. The procedure takes about 3 months to be effective. Another form of birth control must be used during those 3 months. Female sterilization This is a procedure to tie off the tubes that carry sperm (vasectomy). After the procedure, the man can still ejaculate fluid (semen). Another form of birth control must be used for 3 months after the procedure. Natural planning methods Natural family planning In this method, a couple does not have sex on days when the woman could become pregnant. Calendar method In this method, the woman keeps track of the length of each menstrual cycle, identifies the days when pregnancy can happen, and does not have sex on those days. Ovulation method In this method, a couple avoids sex during ovulation. Symptothermal method This method involves not having sex during ovulation. The woman typically checks for ovulation by watching changes in her temperature and in the consistency of cervical mucus. Post-ovulation method In this method, a couple waits to have sex until after ovulation. Where to find more information  Centers for Disease Control and Prevention: www.cdc.gov Summary  Contraception, also called birth control, refers to methods or  devices that prevent pregnancy.  Hormonal methods of contraception include implants, injections, pills, patches, vaginal rings, and emergency contraceptives.  Barrier methods of contraception can include female condoms, female condoms, diaphragms, cervical caps, sponges, and spermicides.  There are two types of IUDs (intrauterine devices). An IUD can be put in a woman's uterus to prevent pregnancy for 3-5 years.  Permanent sterilization can be done through a procedure for males and females. Natural family planning methods involve nothaving sex on days when the woman could become pregnant. This information is not intended to replace advice given to you by your health care provider. Make sure you discuss any questions you have with your health care provider. Document Revised: 04/25/2020 Document Reviewed: 04/25/2020 Elsevier Patient Education  2021 Elsevier Inc.   Breastfeeding  Choosing to breastfeed is one of the best decisions you can make for yourself and your baby. A change in hormones during pregnancy causes your breasts to make breast milk in your milk-producing glands. Hormones prevent breast milk from being released before your baby is born. They also prompt milk flow after birth. Once breastfeeding has begun, thoughts of your baby, as well as his or her sucking or crying, can stimulate the release of milk from your milk-producing glands. Benefits of breastfeeding Research shows that breastfeeding offers many health benefits   for infants and mothers. It also offers a cost-free and convenient way to feed your baby. For your baby  Your first milk (colostrum) helps your baby's digestive system to function better.  Special cells in your milk (antibodies) help your baby to fight off infections.  Breastfed babies are less likely to develop asthma, allergies, obesity, or type 2 diabetes. They are also at lower risk for sudden infant death syndrome (SIDS).  Nutrients in breast milk are better  able to meet your baby's needs compared to infant formula.  Breast milk improves your baby's brain development. For you  Breastfeeding helps to create a very special bond between you and your baby.  Breastfeeding is convenient. Breast milk costs nothing and is always available at the correct temperature.  Breastfeeding helps to burn calories. It helps you to lose the weight that you gained during pregnancy.  Breastfeeding makes your uterus return faster to its size before pregnancy. It also slows bleeding (lochia) after you give birth.  Breastfeeding helps to lower your risk of developing type 2 diabetes, osteoporosis, rheumatoid arthritis, cardiovascular disease, and breast, ovarian, uterine, and endometrial cancer later in life. Breastfeeding basics Starting breastfeeding  Find a comfortable place to sit or lie down, with your neck and back well-supported.  Place a pillow or a rolled-up blanket under your baby to bring him or her to the level of your breast (if you are seated). Nursing pillows are specially designed to help support your arms and your baby while you breastfeed.  Make sure that your baby's tummy (abdomen) is facing your abdomen.  Gently massage your breast. With your fingertips, massage from the outer edges of your breast inward toward the nipple. This encourages milk flow. If your milk flows slowly, you may need to continue this action during the feeding.  Support your breast with 4 fingers underneath and your thumb above your nipple (make the letter "C" with your hand). Make sure your fingers are well away from your nipple and your baby's mouth.  Stroke your baby's lips gently with your finger or nipple.  When your baby's mouth is open wide enough, quickly bring your baby to your breast, placing your entire nipple and as much of the areola as possible into your baby's mouth. The areola is the colored area around your nipple. ? More areola should be visible above your  baby's upper lip than below the lower lip. ? Your baby's lips should be opened and extended outward (flanged) to ensure an adequate, comfortable latch. ? Your baby's tongue should be between his or her lower gum and your breast.  Make sure that your baby's mouth is correctly positioned around your nipple (latched). Your baby's lips should create a seal on your breast and be turned out (everted).  It is common for your baby to suck about 2-3 minutes in order to start the flow of breast milk. Latching Teaching your baby how to latch onto your breast properly is very important. An improper latch can cause nipple pain, decreased milk supply, and poor weight gain in your baby. Also, if your baby is not latched onto your nipple properly, he or she may swallow some air during feeding. This can make your baby fussy. Burping your baby when you switch breasts during the feeding can help to get rid of the air. However, teaching your baby to latch on properly is still the best way to prevent fussiness from swallowing air while breastfeeding. Signs that your baby has successfully latched onto   your nipple  Silent tugging or silent sucking, without causing you pain. Infant's lips should be extended outward (flanged).  Swallowing heard between every 3-4 sucks once your milk has started to flow (after your let-down milk reflex occurs).  Muscle movement above and in front of his or her ears while sucking. Signs that your baby has not successfully latched onto your nipple  Sucking sounds or smacking sounds from your baby while breastfeeding.  Nipple pain. If you think your baby has not latched on correctly, slip your finger into the corner of your baby's mouth to break the suction and place it between your baby's gums. Attempt to start breastfeeding again. Signs of successful breastfeeding Signs from your baby  Your baby will gradually decrease the number of sucks or will completely stop sucking.  Your baby  will fall asleep.  Your baby's body will relax.  Your baby will retain a small amount of milk in his or her mouth.  Your baby will let go of your breast by himself or herself. Signs from you  Breasts that have increased in firmness, weight, and size 1-3 hours after feeding.  Breasts that are softer immediately after breastfeeding.  Increased milk volume, as well as a change in milk consistency and color by the fifth day of breastfeeding.  Nipples that are not sore, cracked, or bleeding. Signs that your baby is getting enough milk  Wetting at least 1-2 diapers during the first 24 hours after birth.  Wetting at least 5-6 diapers every 24 hours for the first week after birth. The urine should be clear or pale yellow by the age of 5 days.  Wetting 6-8 diapers every 24 hours as your baby continues to grow and develop.  At least 3 stools in a 24-hour period by the age of 5 days. The stool should be soft and yellow.  At least 3 stools in a 24-hour period by the age of 7 days. The stool should be seedy and yellow.  No loss of weight greater than 10% of birth weight during the first 3 days of life.  Average weight gain of 4-7 oz (113-198 g) per week after the age of 4 days.  Consistent daily weight gain by the age of 5 days, without weight loss after the age of 2 weeks. After a feeding, your baby may spit up a small amount of milk. This is normal. Breastfeeding frequency and duration Frequent feeding will help you make more milk and can prevent sore nipples and extremely full breasts (breast engorgement). Breastfeed when you feel the need to reduce the fullness of your breasts or when your baby shows signs of hunger. This is called "breastfeeding on demand." Signs that your baby is hungry include:  Increased alertness, activity, or restlessness.  Movement of the head from side to side.  Opening of the mouth when the corner of the mouth or cheek is stroked (rooting).  Increased  sucking sounds, smacking lips, cooing, sighing, or squeaking.  Hand-to-mouth movements and sucking on fingers or hands.  Fussing or crying. Avoid introducing a pacifier to your baby in the first 4-6 weeks after your baby is born. After this time, you may choose to use a pacifier. Research has shown that pacifier use during the first year of a baby's life decreases the risk of sudden infant death syndrome (SIDS). Allow your baby to feed on each breast as long as he or she wants. When your baby unlatches or falls asleep while feeding from the   first breast, offer the second breast. Because newborns are often sleepy in the first few weeks of life, you may need to awaken your baby to get him or her to feed. Breastfeeding times will vary from baby to baby. However, the following rules can serve as a guide to help you make sure that your baby is properly fed:  Newborns (babies 4 weeks of age or younger) may breastfeed every 1-3 hours.  Newborns should not go without breastfeeding for longer than 3 hours during the day or 5 hours during the night.  You should breastfeed your baby a minimum of 8 times in a 24-hour period. Breast milk pumping Pumping and storing breast milk allows you to make sure that your baby is exclusively fed your breast milk, even at times when you are unable to breastfeed. This is especially important if you go back to work while you are still breastfeeding, or if you are not able to be present during feedings. Your lactation consultant can help you find a method of pumping that works best for you and give you guidelines about how long it is safe to store breast milk.      Caring for your breasts while you breastfeed Nipples can become dry, cracked, and sore while breastfeeding. The following recommendations can help keep your breasts moisturized and healthy:  Avoid using soap on your nipples.  Wear a supportive bra designed especially for nursing. Avoid wearing underwire-style  bras or extremely tight bras (sports bras).  Air-dry your nipples for 3-4 minutes after each feeding.  Use only cotton bra pads to absorb leaked breast milk. Leaking of breast milk between feedings is normal.  Use lanolin on your nipples after breastfeeding. Lanolin helps to maintain your skin's normal moisture barrier. Pure lanolin is not harmful (not toxic) to your baby. You may also hand express a few drops of breast milk and gently massage that milk into your nipples and allow the milk to air-dry. In the first few weeks after giving birth, some women experience breast engorgement. Engorgement can make your breasts feel heavy, warm, and tender to the touch. Engorgement peaks within 3-5 days after you give birth. The following recommendations can help to ease engorgement:  Completely empty your breasts while breastfeeding or pumping. You may want to start by applying warm, moist heat (in the shower or with warm, water-soaked hand towels) just before feeding or pumping. This increases circulation and helps the milk flow. If your baby does not completely empty your breasts while breastfeeding, pump any extra milk after he or she is finished.  Apply ice packs to your breasts immediately after breastfeeding or pumping, unless this is too uncomfortable for you. To do this: ? Put ice in a plastic bag. ? Place a towel between your skin and the bag. ? Leave the ice on for 20 minutes, 2-3 times a day.  Make sure that your baby is latched on and positioned properly while breastfeeding. If engorgement persists after 48 hours of following these recommendations, contact your health care provider or a lactation consultant. Overall health care recommendations while breastfeeding  Eat 3 healthy meals and 3 snacks every day. Well-nourished mothers who are breastfeeding need an additional 450-500 calories a day. You can meet this requirement by increasing the amount of a balanced diet that you eat.  Drink  enough water to keep your urine pale yellow or clear.  Rest often, relax, and continue to take your prenatal vitamins to prevent fatigue, stress, and low   vitamin and mineral levels in your body (nutrient deficiencies).  Do not use any products that contain nicotine or tobacco, such as cigarettes and e-cigarettes. Your baby may be harmed by chemicals from cigarettes that pass into breast milk and exposure to secondhand smoke. If you need help quitting, ask your health care provider.  Avoid alcohol.  Do not use illegal drugs or marijuana.  Talk with your health care provider before taking any medicines. These include over-the-counter and prescription medicines as well as vitamins and herbal supplements. Some medicines that may be harmful to your baby can pass through breast milk.  It is possible to become pregnant while breastfeeding. If birth control is desired, ask your health care provider about options that will be safe while breastfeeding your baby. Where to find more information: [employer]: www.llli.org Contact a health care provider if:  You feel like you want to stop breastfeeding or have become frustrated with breastfeeding.  Your nipples are cracked or bleeding.  Your breasts are red, tender, or warm.  You have: ? Painful breasts or nipples. ? A swollen area on either breast. ? A fever or chills. ? Nausea or vomiting. ? Drainage other than breast milk from your nipples.  Your breasts do not become full before feedings by the fifth day after you give birth.  You feel sad and depressed.  Your baby is: ? Too sleepy to eat well. ? Having trouble sleeping. ? More than 1 week old and wetting fewer than 6 diapers in a 24-hour period. ? Not gaining weight by 5 days of age.  Your baby has fewer than 3 stools in a 24-hour period.  Your baby's skin or the white parts of his or her eyes become yellow. Get help right away if:  Your baby is overly tired  (lethargic) and does not want to wake up and feed.  Your baby develops an unexplained fever. Summary  Breastfeeding offers many health benefits for infant and mothers.  Try to breastfeed your infant when he or she shows early signs of hunger.  Gently tickle or stroke your baby's lips with your finger or nipple to allow the baby to open his or her mouth. Bring the baby to your breast. Make sure that much of the areola is in your baby's mouth. Offer one side and burp the baby before you offer the other side.  Talk with your health care provider or lactation consultant if you have questions or you face problems as you breastfeed. This information is not intended to replace advice given to you by your health care provider. Make sure you discuss any questions you have with your health care provider. Document Revised: 02/13/2018 Document Reviewed: 12/21/2016 Elsevier Patient Education  2021 Elsevier Inc.  

## 2021-02-28 NOTE — Progress Notes (Signed)
Pelvic pain rates about 4, started a month ago and lower back pain 4, started few days ago

## 2021-03-01 LAB — HIV ANTIBODY (ROUTINE TESTING W REFLEX): HIV Screen 4th Generation wRfx: NONREACTIVE

## 2021-03-02 LAB — PAIN MGT SCRN (14 DRUGS), UR
Amphetamine Scrn, Ur: NEGATIVE ng/mL
BARBITURATE SCREEN URINE: NEGATIVE ng/mL
BENZODIAZEPINE SCREEN, URINE: NEGATIVE ng/mL
Buprenorphine, Urine: POSITIVE ng/mL — AB
CANNABINOIDS UR QL SCN: POSITIVE ng/mL — AB
Cocaine (Metab) Scrn, Ur: POSITIVE ng/mL — AB
Creatinine(Crt), U: 116.7 mg/dL (ref 20.0–300.0)
Fentanyl, Urine: NEGATIVE pg/mL
Meperidine Screen, Urine: NEGATIVE ng/mL
Methadone Screen, Urine: NEGATIVE ng/mL
OXYCODONE+OXYMORPHONE UR QL SCN: NEGATIVE ng/mL
Opiate Scrn, Ur: NEGATIVE ng/mL
Ph of Urine: 6.4 (ref 4.5–8.9)
Phencyclidine Qn, Ur: NEGATIVE ng/mL
Propoxyphene Scrn, Ur: NEGATIVE ng/mL
Tramadol Screen, Urine: NEGATIVE ng/mL

## 2021-03-09 MED ORDER — ONDANSETRON 4 MG PO TBDP
4.0000 mg | ORAL_TABLET | Freq: Three times a day (TID) | ORAL | 2 refills | Status: DC | PRN
Start: 2021-03-09 — End: 2021-05-06

## 2021-03-10 ENCOUNTER — Ambulatory Visit: Payer: Medicaid Other | Attending: Obstetrics and Gynecology

## 2021-03-10 ENCOUNTER — Ambulatory Visit: Payer: Medicaid Other | Admitting: *Deleted

## 2021-03-10 ENCOUNTER — Encounter: Payer: Self-pay | Admitting: *Deleted

## 2021-03-10 ENCOUNTER — Other Ambulatory Visit: Payer: Self-pay

## 2021-03-10 DIAGNOSIS — F1120 Opioid dependence, uncomplicated: Secondary | ICD-10-CM | POA: Insufficient documentation

## 2021-03-10 DIAGNOSIS — F1910 Other psychoactive substance abuse, uncomplicated: Secondary | ICD-10-CM

## 2021-03-10 DIAGNOSIS — O99323 Drug use complicating pregnancy, third trimester: Secondary | ICD-10-CM | POA: Insufficient documentation

## 2021-03-10 DIAGNOSIS — O99321 Drug use complicating pregnancy, first trimester: Secondary | ICD-10-CM

## 2021-03-10 DIAGNOSIS — Z2839 Other underimmunization status: Secondary | ICD-10-CM | POA: Insufficient documentation

## 2021-03-10 DIAGNOSIS — O99333 Smoking (tobacco) complicating pregnancy, third trimester: Secondary | ICD-10-CM | POA: Diagnosis not present

## 2021-03-10 DIAGNOSIS — O2341 Unspecified infection of urinary tract in pregnancy, first trimester: Secondary | ICD-10-CM | POA: Insufficient documentation

## 2021-03-10 DIAGNOSIS — Z3A32 32 weeks gestation of pregnancy: Secondary | ICD-10-CM

## 2021-03-10 DIAGNOSIS — F1410 Cocaine abuse, uncomplicated: Secondary | ICD-10-CM

## 2021-03-10 DIAGNOSIS — O0990 Supervision of high risk pregnancy, unspecified, unspecified trimester: Secondary | ICD-10-CM | POA: Insufficient documentation

## 2021-03-10 DIAGNOSIS — O358XX0 Maternal care for other (suspected) fetal abnormality and damage, not applicable or unspecified: Secondary | ICD-10-CM | POA: Diagnosis not present

## 2021-03-10 DIAGNOSIS — F1290 Cannabis use, unspecified, uncomplicated: Secondary | ICD-10-CM

## 2021-03-10 DIAGNOSIS — F17290 Nicotine dependence, other tobacco product, uncomplicated: Secondary | ICD-10-CM

## 2021-03-10 DIAGNOSIS — O99320 Drug use complicating pregnancy, unspecified trimester: Secondary | ICD-10-CM | POA: Diagnosis present

## 2021-03-10 DIAGNOSIS — Z148 Genetic carrier of other disease: Secondary | ICD-10-CM

## 2021-03-13 ENCOUNTER — Other Ambulatory Visit: Payer: Self-pay | Admitting: *Deleted

## 2021-03-13 DIAGNOSIS — F1120 Opioid dependence, uncomplicated: Secondary | ICD-10-CM

## 2021-03-13 DIAGNOSIS — O99320 Drug use complicating pregnancy, unspecified trimester: Secondary | ICD-10-CM

## 2021-03-14 ENCOUNTER — Ambulatory Visit (INDEPENDENT_AMBULATORY_CARE_PROVIDER_SITE_OTHER): Payer: Medicaid Other | Admitting: Family Medicine

## 2021-03-14 ENCOUNTER — Encounter: Payer: Self-pay | Admitting: Family Medicine

## 2021-03-14 ENCOUNTER — Other Ambulatory Visit: Payer: Self-pay

## 2021-03-14 VITALS — BP 113/76 | HR 97 | Wt 193.9 lb

## 2021-03-14 DIAGNOSIS — O09899 Supervision of other high risk pregnancies, unspecified trimester: Secondary | ICD-10-CM

## 2021-03-14 DIAGNOSIS — F1120 Opioid dependence, uncomplicated: Secondary | ICD-10-CM

## 2021-03-14 DIAGNOSIS — F1290 Cannabis use, unspecified, uncomplicated: Secondary | ICD-10-CM

## 2021-03-14 DIAGNOSIS — Z2839 Other underimmunization status: Secondary | ICD-10-CM

## 2021-03-14 DIAGNOSIS — O0990 Supervision of high risk pregnancy, unspecified, unspecified trimester: Secondary | ICD-10-CM

## 2021-03-14 DIAGNOSIS — F17200 Nicotine dependence, unspecified, uncomplicated: Secondary | ICD-10-CM

## 2021-03-14 DIAGNOSIS — O99320 Drug use complicating pregnancy, unspecified trimester: Secondary | ICD-10-CM

## 2021-03-14 LAB — POCT URINALYSIS DIP (DEVICE)
Bilirubin Urine: NEGATIVE
Glucose, UA: NEGATIVE mg/dL
Hgb urine dipstick: NEGATIVE
Ketones, ur: NEGATIVE mg/dL
Nitrite: NEGATIVE
Protein, ur: NEGATIVE mg/dL
Specific Gravity, Urine: 1.015 (ref 1.005–1.030)
Urobilinogen, UA: 0.2 mg/dL (ref 0.0–1.0)
pH: 6 (ref 5.0–8.0)

## 2021-03-14 MED ORDER — BUPRENORPHINE HCL-NALOXONE HCL 8-2 MG SL FILM: 8.0000 mg | ORAL_FILM | Freq: Two times a day (BID) | SUBLINGUAL | 0 refills | Status: DC

## 2021-03-14 NOTE — Progress Notes (Signed)
Pain when baby is moving, states that it feels like "lightening"

## 2021-03-14 NOTE — Progress Notes (Signed)
   Subjective:  Monica Vazquez is a 25 y.o. G2P0010 at [redacted]w[redacted]d being seen today for ongoing prenatal care.  She is currently monitored for the following issues for this high-risk pregnancy and has Supervision of high risk pregnancy, antepartum; Suboxone maintenance treatment complicating pregnancy, antepartum (HCC); Smoker; Anxiety; Maternal UTI (urinary tract infection), first trimester; Rubella non-immune status, antepartum; Cocaine abuse affecting pregnancy in first trimester (HCC); Marijuana use; and Abn chromsoml and genetic find on antenat screen of mother on their problem list.  Patient reports no complaints.  Contractions: Not present. Vag. Bleeding: None.  Movement: Present. Denies leaking of fluid.   The following portions of the patient's history were reviewed and updated as appropriate: allergies, current medications, past family history, past medical history, past social history, past surgical history and problem list. Problem list updated.  Objective:   Vitals:   03/14/21 1555  BP: 113/76  Pulse: 97  Weight: 193 lb 14.4 oz (88 kg)    Fetal Status: Fetal Heart Rate (bpm): 136   Movement: Present     General:  Alert, oriented and cooperative. Patient is in no acute distress.  Skin: Skin is warm and dry. No rash noted.   Cardiovascular: Normal heart rate noted  Respiratory: Normal respiratory effort, no problems with respiration noted  Abdomen: Soft, gravid, appropriate for gestational age. Pain/Pressure: Present     Pelvic: Vag. Bleeding: None     Cervical exam deferred        Extremities: Normal range of motion.  Edema: Trace  Mental Status: Normal mood and affect. Normal behavior. Normal judgment and thought content.   Urinalysis:      Assessment and Plan:  Pregnancy: G2P0010 at [redacted]w[redacted]d  1. Suboxone maintenance treatment complicating pregnancy, antepartum (HCC) Stable UDS today - Pain Mgt Scrn (14 Drugs), Ur  2. Supervision of high risk pregnancy, antepartum BP  and FHR normal  3. Smoker Trying to cut down  4. Rubella non-immune status, antepartum   5. Marijuana use Very infrequent use  Preterm labor symptoms and general obstetric precautions including but not limited to vaginal bleeding, contractions, leaking of fluid and fetal movement were reviewed in detail with the patient. Please refer to After Visit Summary for other counseling recommendations.  Return in 2 weeks (on 03/28/2021) for [facility], ob visit.   Venora Maples, MD

## 2021-03-14 NOTE — Patient Instructions (Signed)
 Contraception Choices Contraception, also called birth control, refers to methods or devices that prevent pregnancy. Hormonal methods Contraceptive implant A contraceptive implant is a thin, plastic tube that contains a hormone that prevents pregnancy. It is different from an intrauterine device (IUD). It is inserted into the upper part of the arm by a health care provider. Implants can be effective for up to 3 years. Progestin-only injections Progestin-only injections are injections of progestin, a synthetic form of the hormone progesterone. They are given every 3 months by a health care provider. Birth control pills Birth control pills are pills that contain hormones that prevent pregnancy. They must be taken once a day, preferably at the same time each day. A prescription is needed to use this method of contraception. Birth control patch The birth control patch contains hormones that prevent pregnancy. It is placed on the skin and must be changed once a week for three weeks and removed on the fourth week. A prescription is needed to use this method of contraception. Vaginal ring A vaginal ring contains hormones that prevent pregnancy. It is placed in the vagina for three weeks and removed on the fourth week. After that, the process is repeated with a new ring. A prescription is needed to use this method of contraception. Emergency contraceptive Emergency contraceptives prevent pregnancy after unprotected sex. They come in pill form and can be taken up to 5 days after sex. They work best the sooner they are taken after having sex. Most emergency contraceptives are available without a prescription. This method should not be used as your only form of birth control.   Barrier methods Female condom A female condom is a thin sheath that is worn over the penis during sex. Condoms keep sperm from going inside a woman's body. They can be used with a sperm-killing substance (spermicide) to increase their  effectiveness. They should be thrown away after one use. Female condom A female condom is a soft, loose-fitting sheath that is put into the vagina before sex. The condom keeps sperm from going inside a woman's body. They should be thrown away after one use. Diaphragm A diaphragm is a soft, dome-shaped barrier. It is inserted into the vagina before sex, along with a spermicide. The diaphragm blocks sperm from entering the uterus, and the spermicide kills sperm. A diaphragm should be left in the vagina for 6-8 hours after sex and removed within 24 hours. A diaphragm is prescribed and fitted by a health care provider. A diaphragm should be replaced every 1-2 years, after giving birth, after gaining more than 15 lb (6.8 kg), and after pelvic surgery. Cervical cap A cervical cap is a round, soft latex or plastic cup that fits over the cervix. It is inserted into the vagina before sex, along with spermicide. It blocks sperm from entering the uterus. The cap should be left in place for 6-8 hours after sex and removed within 48 hours. A cervical cap must be prescribed and fitted by a health care provider. It should be replaced every 2 years. Sponge A sponge is a soft, circular piece of polyurethane foam with spermicide in it. The sponge helps block sperm from entering the uterus, and the spermicide kills sperm. To use it, you make it wet and then insert it into the vagina. It should be inserted before sex, left in for at least 6 hours after sex, and removed and thrown away within 30 hours. Spermicides Spermicides are chemicals that kill or block sperm from entering the   cervix and uterus. They can come as a cream, jelly, suppository, foam, or tablet. A spermicide should be inserted into the vagina with an applicator at least 10-15 minutes before sex to allow time for it to work. The process must be repeated every time you have sex. Spermicides do not require a prescription.   Intrauterine  contraception Intrauterine device (IUD) An IUD is a T-shaped device that is put in a woman's uterus. There are two types:  Hormone IUD.This type contains progestin, a synthetic form of the hormone progesterone. This type can stay in place for 3-5 years.  Copper IUD.This type is wrapped in copper wire. It can stay in place for 10 years. Permanent methods of contraception Female tubal ligation In this method, a woman's fallopian tubes are sealed, tied, or blocked during surgery to prevent eggs from traveling to the uterus. Hysteroscopic sterilization In this method, a small, flexible insert is placed into each fallopian tube. The inserts cause scar tissue to form in the fallopian tubes and block them, so sperm cannot reach an egg. The procedure takes about 3 months to be effective. Another form of birth control must be used during those 3 months. Female sterilization This is a procedure to tie off the tubes that carry sperm (vasectomy). After the procedure, the man can still ejaculate fluid (semen). Another form of birth control must be used for 3 months after the procedure. Natural planning methods Natural family planning In this method, a couple does not have sex on days when the woman could become pregnant. Calendar method In this method, the woman keeps track of the length of each menstrual cycle, identifies the days when pregnancy can happen, and does not have sex on those days. Ovulation method In this method, a couple avoids sex during ovulation. Symptothermal method This method involves not having sex during ovulation. The woman typically checks for ovulation by watching changes in her temperature and in the consistency of cervical mucus. Post-ovulation method In this method, a couple waits to have sex until after ovulation. Where to find more information  Centers for Disease Control and Prevention: www.cdc.gov Summary  Contraception, also called birth control, refers to methods or  devices that prevent pregnancy.  Hormonal methods of contraception include implants, injections, pills, patches, vaginal rings, and emergency contraceptives.  Barrier methods of contraception can include female condoms, female condoms, diaphragms, cervical caps, sponges, and spermicides.  There are two types of IUDs (intrauterine devices). An IUD can be put in a woman's uterus to prevent pregnancy for 3-5 years.  Permanent sterilization can be done through a procedure for males and females. Natural family planning methods involve nothaving sex on days when the woman could become pregnant. This information is not intended to replace advice given to you by your health care provider. Make sure you discuss any questions you have with your health care provider. Document Revised: 04/25/2020 Document Reviewed: 04/25/2020 Elsevier Patient Education  2021 Elsevier Inc.   Breastfeeding  Choosing to breastfeed is one of the best decisions you can make for yourself and your baby. A change in hormones during pregnancy causes your breasts to make breast milk in your milk-producing glands. Hormones prevent breast milk from being released before your baby is born. They also prompt milk flow after birth. Once breastfeeding has begun, thoughts of your baby, as well as his or her sucking or crying, can stimulate the release of milk from your milk-producing glands. Benefits of breastfeeding Research shows that breastfeeding offers many health benefits   for infants and mothers. It also offers a cost-free and convenient way to feed your baby. For your baby  Your first milk (colostrum) helps your baby's digestive system to function better.  Special cells in your milk (antibodies) help your baby to fight off infections.  Breastfed babies are less likely to develop asthma, allergies, obesity, or type 2 diabetes. They are also at lower risk for sudden infant death syndrome (SIDS).  Nutrients in breast milk are better  able to meet your baby's needs compared to infant formula.  Breast milk improves your baby's brain development. For you  Breastfeeding helps to create a very special bond between you and your baby.  Breastfeeding is convenient. Breast milk costs nothing and is always available at the correct temperature.  Breastfeeding helps to burn calories. It helps you to lose the weight that you gained during pregnancy.  Breastfeeding makes your uterus return faster to its size before pregnancy. It also slows bleeding (lochia) after you give birth.  Breastfeeding helps to lower your risk of developing type 2 diabetes, osteoporosis, rheumatoid arthritis, cardiovascular disease, and breast, ovarian, uterine, and endometrial cancer later in life. Breastfeeding basics Starting breastfeeding  Find a comfortable place to sit or lie down, with your neck and back well-supported.  Place a pillow or a rolled-up blanket under your baby to bring him or her to the level of your breast (if you are seated). Nursing pillows are specially designed to help support your arms and your baby while you breastfeed.  Make sure that your baby's tummy (abdomen) is facing your abdomen.  Gently massage your breast. With your fingertips, massage from the outer edges of your breast inward toward the nipple. This encourages milk flow. If your milk flows slowly, you may need to continue this action during the feeding.  Support your breast with 4 fingers underneath and your thumb above your nipple (make the letter "C" with your hand). Make sure your fingers are well away from your nipple and your baby's mouth.  Stroke your baby's lips gently with your finger or nipple.  When your baby's mouth is open wide enough, quickly bring your baby to your breast, placing your entire nipple and as much of the areola as possible into your baby's mouth. The areola is the colored area around your nipple. ? More areola should be visible above your  baby's upper lip than below the lower lip. ? Your baby's lips should be opened and extended outward (flanged) to ensure an adequate, comfortable latch. ? Your baby's tongue should be between his or her lower gum and your breast.  Make sure that your baby's mouth is correctly positioned around your nipple (latched). Your baby's lips should create a seal on your breast and be turned out (everted).  It is common for your baby to suck about 2-3 minutes in order to start the flow of breast milk. Latching Teaching your baby how to latch onto your breast properly is very important. An improper latch can cause nipple pain, decreased milk supply, and poor weight gain in your baby. Also, if your baby is not latched onto your nipple properly, he or she may swallow some air during feeding. This can make your baby fussy. Burping your baby when you switch breasts during the feeding can help to get rid of the air. However, teaching your baby to latch on properly is still the best way to prevent fussiness from swallowing air while breastfeeding. Signs that your baby has successfully latched onto   your nipple  Silent tugging or silent sucking, without causing you pain. Infant's lips should be extended outward (flanged).  Swallowing heard between every 3-4 sucks once your milk has started to flow (after your let-down milk reflex occurs).  Muscle movement above and in front of his or her ears while sucking. Signs that your baby has not successfully latched onto your nipple  Sucking sounds or smacking sounds from your baby while breastfeeding.  Nipple pain. If you think your baby has not latched on correctly, slip your finger into the corner of your baby's mouth to break the suction and place it between your baby's gums. Attempt to start breastfeeding again. Signs of successful breastfeeding Signs from your baby  Your baby will gradually decrease the number of sucks or will completely stop sucking.  Your baby  will fall asleep.  Your baby's body will relax.  Your baby will retain a small amount of milk in his or her mouth.  Your baby will let go of your breast by himself or herself. Signs from you  Breasts that have increased in firmness, weight, and size 1-3 hours after feeding.  Breasts that are softer immediately after breastfeeding.  Increased milk volume, as well as a change in milk consistency and color by the fifth day of breastfeeding.  Nipples that are not sore, cracked, or bleeding. Signs that your baby is getting enough milk  Wetting at least 1-2 diapers during the first 24 hours after birth.  Wetting at least 5-6 diapers every 24 hours for the first week after birth. The urine should be clear or pale yellow by the age of 5 days.  Wetting 6-8 diapers every 24 hours as your baby continues to grow and develop.  At least 3 stools in a 24-hour period by the age of 5 days. The stool should be soft and yellow.  At least 3 stools in a 24-hour period by the age of 7 days. The stool should be seedy and yellow.  No loss of weight greater than 10% of birth weight during the first 3 days of life.  Average weight gain of 4-7 oz (113-198 g) per week after the age of 4 days.  Consistent daily weight gain by the age of 5 days, without weight loss after the age of 2 weeks. After a feeding, your baby may spit up a small amount of milk. This is normal. Breastfeeding frequency and duration Frequent feeding will help you make more milk and can prevent sore nipples and extremely full breasts (breast engorgement). Breastfeed when you feel the need to reduce the fullness of your breasts or when your baby shows signs of hunger. This is called "breastfeeding on demand." Signs that your baby is hungry include:  Increased alertness, activity, or restlessness.  Movement of the head from side to side.  Opening of the mouth when the corner of the mouth or cheek is stroked (rooting).  Increased  sucking sounds, smacking lips, cooing, sighing, or squeaking.  Hand-to-mouth movements and sucking on fingers or hands.  Fussing or crying. Avoid introducing a pacifier to your baby in the first 4-6 weeks after your baby is born. After this time, you may choose to use a pacifier. Research has shown that pacifier use during the first year of a baby's life decreases the risk of sudden infant death syndrome (SIDS). Allow your baby to feed on each breast as long as he or she wants. When your baby unlatches or falls asleep while feeding from the   first breast, offer the second breast. Because newborns are often sleepy in the first few weeks of life, you may need to awaken your baby to get him or her to feed. Breastfeeding times will vary from baby to baby. However, the following rules can serve as a guide to help you make sure that your baby is properly fed:  Newborns (babies 4 weeks of age or younger) may breastfeed every 1-3 hours.  Newborns should not go without breastfeeding for longer than 3 hours during the day or 5 hours during the night.  You should breastfeed your baby a minimum of 8 times in a 24-hour period. Breast milk pumping Pumping and storing breast milk allows you to make sure that your baby is exclusively fed your breast milk, even at times when you are unable to breastfeed. This is especially important if you go back to work while you are still breastfeeding, or if you are not able to be present during feedings. Your lactation consultant can help you find a method of pumping that works best for you and give you guidelines about how long it is safe to store breast milk.      Caring for your breasts while you breastfeed Nipples can become dry, cracked, and sore while breastfeeding. The following recommendations can help keep your breasts moisturized and healthy:  Avoid using soap on your nipples.  Wear a supportive bra designed especially for nursing. Avoid wearing underwire-style  bras or extremely tight bras (sports bras).  Air-dry your nipples for 3-4 minutes after each feeding.  Use only cotton bra pads to absorb leaked breast milk. Leaking of breast milk between feedings is normal.  Use lanolin on your nipples after breastfeeding. Lanolin helps to maintain your skin's normal moisture barrier. Pure lanolin is not harmful (not toxic) to your baby. You may also hand express a few drops of breast milk and gently massage that milk into your nipples and allow the milk to air-dry. In the first few weeks after giving birth, some women experience breast engorgement. Engorgement can make your breasts feel heavy, warm, and tender to the touch. Engorgement peaks within 3-5 days after you give birth. The following recommendations can help to ease engorgement:  Completely empty your breasts while breastfeeding or pumping. You may want to start by applying warm, moist heat (in the shower or with warm, water-soaked hand towels) just before feeding or pumping. This increases circulation and helps the milk flow. If your baby does not completely empty your breasts while breastfeeding, pump any extra milk after he or she is finished.  Apply ice packs to your breasts immediately after breastfeeding or pumping, unless this is too uncomfortable for you. To do this: ? Put ice in a plastic bag. ? Place a towel between your skin and the bag. ? Leave the ice on for 20 minutes, 2-3 times a day.  Make sure that your baby is latched on and positioned properly while breastfeeding. If engorgement persists after 48 hours of following these recommendations, contact your health care provider or a lactation consultant. Overall health care recommendations while breastfeeding  Eat 3 healthy meals and 3 snacks every day. Well-nourished mothers who are breastfeeding need an additional 450-500 calories a day. You can meet this requirement by increasing the amount of a balanced diet that you eat.  Drink  enough water to keep your urine pale yellow or clear.  Rest often, relax, and continue to take your prenatal vitamins to prevent fatigue, stress, and low   vitamin and mineral levels in your body (nutrient deficiencies).  Do not use any products that contain nicotine or tobacco, such as cigarettes and e-cigarettes. Your baby may be harmed by chemicals from cigarettes that pass into breast milk and exposure to secondhand smoke. If you need help quitting, ask your health care provider.  Avoid alcohol.  Do not use illegal drugs or marijuana.  Talk with your health care provider before taking any medicines. These include over-the-counter and prescription medicines as well as vitamins and herbal supplements. Some medicines that may be harmful to your baby can pass through breast milk.  It is possible to become pregnant while breastfeeding. If birth control is desired, ask your health care provider about options that will be safe while breastfeeding your baby. Where to find more information: [employer]: www.llli.org Contact a health care provider if:  You feel like you want to stop breastfeeding or have become frustrated with breastfeeding.  Your nipples are cracked or bleeding.  Your breasts are red, tender, or warm.  You have: ? Painful breasts or nipples. ? A swollen area on either breast. ? A fever or chills. ? Nausea or vomiting. ? Drainage other than breast milk from your nipples.  Your breasts do not become full before feedings by the fifth day after you give birth.  You feel sad and depressed.  Your baby is: ? Too sleepy to eat well. ? Having trouble sleeping. ? More than 1 week old and wetting fewer than 6 diapers in a 24-hour period. ? Not gaining weight by 5 days of age.  Your baby has fewer than 3 stools in a 24-hour period.  Your baby's skin or the white parts of his or her eyes become yellow. Get help right away if:  Your baby is overly tired  (lethargic) and does not want to wake up and feed.  Your baby develops an unexplained fever. Summary  Breastfeeding offers many health benefits for infant and mothers.  Try to breastfeed your infant when he or she shows early signs of hunger.  Gently tickle or stroke your baby's lips with your finger or nipple to allow the baby to open his or her mouth. Bring the baby to your breast. Make sure that much of the areola is in your baby's mouth. Offer one side and burp the baby before you offer the other side.  Talk with your health care provider or lactation consultant if you have questions or you face problems as you breastfeed. This information is not intended to replace advice given to you by your health care provider. Make sure you discuss any questions you have with your health care provider. Document Revised: 02/13/2018 Document Reviewed: 12/21/2016 Elsevier Patient Education  2021 Elsevier Inc.  

## 2021-03-16 LAB — PAIN MGT SCRN (14 DRUGS), UR
Amphetamine Scrn, Ur: NEGATIVE ng/mL
BARBITURATE SCREEN URINE: NEGATIVE ng/mL
BENZODIAZEPINE SCREEN, URINE: NEGATIVE ng/mL
Buprenorphine, Urine: POSITIVE ng/mL — AB
CANNABINOIDS UR QL SCN: POSITIVE ng/mL — AB
Cocaine (Metab) Scrn, Ur: POSITIVE ng/mL — AB
Creatinine(Crt), U: 23.6 mg/dL (ref 20.0–300.0)
Fentanyl, Urine: NEGATIVE pg/mL
Meperidine Screen, Urine: NEGATIVE ng/mL
Methadone Screen, Urine: NEGATIVE ng/mL
OXYCODONE+OXYMORPHONE UR QL SCN: NEGATIVE ng/mL
Opiate Scrn, Ur: NEGATIVE ng/mL
Ph of Urine: 7 (ref 4.5–8.9)
Phencyclidine Qn, Ur: NEGATIVE ng/mL
Propoxyphene Scrn, Ur: NEGATIVE ng/mL
Tramadol Screen, Urine: NEGATIVE ng/mL

## 2021-03-28 ENCOUNTER — Other Ambulatory Visit: Payer: Self-pay

## 2021-03-28 ENCOUNTER — Encounter: Payer: Medicaid Other | Admitting: Family Medicine

## 2021-03-28 DIAGNOSIS — F1120 Opioid dependence, uncomplicated: Secondary | ICD-10-CM

## 2021-03-28 DIAGNOSIS — O99320 Drug use complicating pregnancy, unspecified trimester: Secondary | ICD-10-CM

## 2021-03-28 DIAGNOSIS — Z3480 Encounter for supervision of other normal pregnancy, unspecified trimester: Secondary | ICD-10-CM

## 2021-03-28 MED ORDER — BUPRENORPHINE HCL-NALOXONE HCL 8-2 MG SL FILM: 8.0000 mg | ORAL_FILM | Freq: Two times a day (BID) | SUBLINGUAL | 0 refills | Status: DC

## 2021-03-28 NOTE — Telephone Encounter (Signed)
Pt request refill on Suboxone.  Message routed to Dr. Crissie Reese for advisement.    Addison Naegeli, RN  03/28/21

## 2021-03-30 ENCOUNTER — Encounter: Payer: Medicaid Other | Admitting: Obstetrics and Gynecology

## 2021-04-07 ENCOUNTER — Ambulatory Visit: Payer: Medicaid Other | Attending: Maternal & Fetal Medicine

## 2021-04-07 ENCOUNTER — Ambulatory Visit (INDEPENDENT_AMBULATORY_CARE_PROVIDER_SITE_OTHER): Payer: Medicaid Other | Admitting: Family Medicine

## 2021-04-07 ENCOUNTER — Other Ambulatory Visit (HOSPITAL_COMMUNITY)
Admission: RE | Admit: 2021-04-07 | Discharge: 2021-04-07 | Disposition: A | Discharge status: Home / Self Care | Payer: Medicaid Other | Source: Ambulatory Visit | Attending: Obstetrics and Gynecology | Admitting: Obstetrics and Gynecology

## 2021-04-07 ENCOUNTER — Other Ambulatory Visit: Payer: Self-pay

## 2021-04-07 ENCOUNTER — Encounter: Payer: Self-pay | Admitting: *Deleted

## 2021-04-07 ENCOUNTER — Other Ambulatory Visit: Payer: Self-pay | Admitting: Maternal & Fetal Medicine

## 2021-04-07 ENCOUNTER — Ambulatory Visit (HOSPITAL_BASED_OUTPATIENT_CLINIC_OR_DEPARTMENT_OTHER): Payer: Medicaid Other | Admitting: *Deleted

## 2021-04-07 ENCOUNTER — Ambulatory Visit: Payer: Medicaid Other | Admitting: *Deleted

## 2021-04-07 VITALS — BP 110/69 | HR 88 | Wt 195.8 lb

## 2021-04-07 DIAGNOSIS — F1410 Cocaine abuse, uncomplicated: Secondary | ICD-10-CM | POA: Insufficient documentation

## 2021-04-07 DIAGNOSIS — Z3A36 36 weeks gestation of pregnancy: Secondary | ICD-10-CM | POA: Diagnosis not present

## 2021-04-07 DIAGNOSIS — O358XX0 Maternal care for other (suspected) fetal abnormality and damage, not applicable or unspecified: Secondary | ICD-10-CM

## 2021-04-07 DIAGNOSIS — O09899 Supervision of other high risk pregnancies, unspecified trimester: Secondary | ICD-10-CM

## 2021-04-07 DIAGNOSIS — O99321 Drug use complicating pregnancy, first trimester: Secondary | ICD-10-CM | POA: Insufficient documentation

## 2021-04-07 DIAGNOSIS — F1120 Opioid dependence, uncomplicated: Secondary | ICD-10-CM

## 2021-04-07 DIAGNOSIS — O99333 Smoking (tobacco) complicating pregnancy, third trimester: Secondary | ICD-10-CM

## 2021-04-07 DIAGNOSIS — O4103X0 Oligohydramnios, third trimester, not applicable or unspecified: Secondary | ICD-10-CM | POA: Insufficient documentation

## 2021-04-07 DIAGNOSIS — O99320 Drug use complicating pregnancy, unspecified trimester: Secondary | ICD-10-CM | POA: Insufficient documentation

## 2021-04-07 DIAGNOSIS — Z2839 Other underimmunization status: Secondary | ICD-10-CM | POA: Diagnosis present

## 2021-04-07 DIAGNOSIS — F1910 Other psychoactive substance abuse, uncomplicated: Secondary | ICD-10-CM

## 2021-04-07 DIAGNOSIS — O2341 Unspecified infection of urinary tract in pregnancy, first trimester: Secondary | ICD-10-CM | POA: Diagnosis present

## 2021-04-07 DIAGNOSIS — O368130 Decreased fetal movements, third trimester, not applicable or unspecified: Secondary | ICD-10-CM

## 2021-04-07 DIAGNOSIS — F1290 Cannabis use, unspecified, uncomplicated: Secondary | ICD-10-CM | POA: Diagnosis present

## 2021-04-07 DIAGNOSIS — O0990 Supervision of high risk pregnancy, unspecified, unspecified trimester: Secondary | ICD-10-CM | POA: Diagnosis not present

## 2021-04-07 DIAGNOSIS — F419 Anxiety disorder, unspecified: Secondary | ICD-10-CM

## 2021-04-07 DIAGNOSIS — O99323 Drug use complicating pregnancy, third trimester: Secondary | ICD-10-CM | POA: Insufficient documentation

## 2021-04-07 DIAGNOSIS — F17200 Nicotine dependence, unspecified, uncomplicated: Secondary | ICD-10-CM

## 2021-04-07 DIAGNOSIS — Z148 Genetic carrier of other disease: Secondary | ICD-10-CM

## 2021-04-07 DIAGNOSIS — F17210 Nicotine dependence, cigarettes, uncomplicated: Secondary | ICD-10-CM

## 2021-04-07 MED ORDER — BUPRENORPHINE HCL-NALOXONE HCL 8-2 MG SL FILM: 8.0000 mg | ORAL_FILM | Freq: Two times a day (BID) | SUBLINGUAL | 0 refills | Status: DC

## 2021-04-07 NOTE — Progress Notes (Signed)
C/o" lots of pressure, I feel like I need to pee all the time."

## 2021-04-07 NOTE — Procedures (Signed)
RICKIYA PICARIELLO 04-26-1996 [redacted]w[redacted]d  Fetus A Non-Stress Test Interpretation for 04/07/21  Indication: Decreased Fetal Movement  Fetal Heart Rate A Mode: External Baseline Rate (A): 145 bpm Variability: Moderate Accelerations: 15 x 15 Decelerations: None Multiple birth?: No  Uterine Activity Mode: Palpation,Toco Contraction Frequency (min): none Resting Tone Palpated: Relaxed Resting Time: Adequate  Interpretation (Fetal Testing) Nonstress Test Interpretation: Reactive Overall Impression: Reassuring for gestational age Comments: Dr. Parke Poisson reviewed tracing.

## 2021-04-07 NOTE — Patient Instructions (Signed)

## 2021-04-07 NOTE — Progress Notes (Signed)
   Subjective:  Monica Vazquez is a 25 y.o. G2P0010 at 110w1d being seen today for ongoing prenatal care.  She is currently monitored for the following issues for this high-risk pregnancy and has Supervision of high risk pregnancy, antepartum; Suboxone maintenance treatment complicating pregnancy, antepartum ([place]); Smoker; Anxiety; Maternal UTI (urinary tract infection), first trimester; Rubella non-immune status, antepartum; Cocaine abuse affecting pregnancy in first trimester ([place]); Marijuana use; and Abn chromsoml and genetic find on antenat screen of mother on their problem list.  Patient reports no complaints.  Contractions: Not present. Vag. Bleeding: None.  Movement: Present. Denies leaking of fluid.   The following portions of the patient's history were reviewed and updated as appropriate: allergies, current medications, past family history, past medical history, past social history, past surgical history and problem list. Problem list updated.  Objective:   Vitals:   04/07/21 1115  BP: 110/69  Pulse: 88  Weight: 195 lb 12.8 oz (88.8 kg)    Fetal Status: Fetal Heart Rate (bpm): 145   Movement: Present     General:  Alert, oriented and cooperative. Patient is in no acute distress.  Skin: Skin is warm and dry. No rash noted.   Cardiovascular: Normal heart rate noted  Respiratory: Normal respiratory effort, no problems with respiration noted  Abdomen: Soft, gravid, appropriate for gestational age. Pain/Pressure: Present     Pelvic: Vag. Bleeding: None     Cervical exam deferred        Extremities: Normal range of motion.  Edema: Trace  Mental Status: Normal mood and affect. Normal behavior. Normal judgment and thought content.   Urinalysis:      Assessment and Plan:  Pregnancy: G2P0010 at [redacted]w[redacted]d  1. Suboxone maintenance treatment complicating pregnancy, antepartum ([place]) Refill sent UDS today - Pain Mgt Scrn (14 Drugs), Ur  2. Supervision of high risk pregnancy,  antepartum BP and FHR normal Swabs today Growth [name] today showed borderline EFW of 12% and borderline AFI of 6. BPP was 6/10 with reactive NST. Given these findings recommended patient go to MAU tomorrow for repeat BPP. If reassuring should have NST/AFI in our office next week. Otherwise will need IOL at 37 weeks. Patient understands and is in agreement with this plan - Culture, beta strep (group b only) - GC/Chlamydia probe amp ([place])not at [facility]  3. Smoker Still smoking about a pack a day  4. Anxiety stable  5. Rubella non-immune status, antepartum MMR PP  6. Marijuana use She quit! Almost two weeks since last use Congratulated   Preterm labor symptoms and general obstetric precautions including but not limited to vaginal bleeding, contractions, leaking of fluid and fetal movement were reviewed in detail with the patient. Please refer to After Visit Summary for other counseling recommendations.  Return in about 2 weeks (around 04/21/2021) for [facility], ob visit.   Clarnce Flock, MD

## 2021-04-07 NOTE — Progress Notes (Signed)
MFM Note  Monica Vazquez was seen for a follow up growth scan due to maternal treatment with Suboxone.  The patient reports that she just worked last night and took Suboxone about an hour before her ultrasound appointment this morning.  She also did not eat breakfast.  She was informed that the fetal growth appears in the lower normal range today (12th percentile).  Low normal amniotic fluid with a total AFI of 6.1 cm was noted today.  A biophysical profile performed today was 4 out of 8.  She received a -2 for absent fetal movement and another -2 for fetal breathing movements that did not meet criteria.  She subsequently had a reactive nonstress test making her total biophysical profile score 6 out of 10.  Due to a biophysical profile score of 6 out of 10, the patient will have another biophysical profile performed tomorrow.  She will go to the MAU at around 2:30 PM tomorrow for this exam.  She was advised to eat lunch before her biophysical profile tomorrow.  Should the fetal status be reassuring tomorrow and she is discharged home, the patient should have another NST and amniotic fluid check performed in your office next week.    Should her total AFI drop below 5 cm or should a 2 cm or greater maximal vertical pocket be absent next week, delivery would be recommended at that time.  A total of 20 minutes was spent counseling and coordinating the care for this patient.  Greater than 50% of the time was spent in direct face-to-face contact.

## 2021-04-08 ENCOUNTER — Other Ambulatory Visit: Payer: Self-pay

## 2021-04-08 ENCOUNTER — Inpatient Hospital Stay (HOSPITAL_COMMUNITY)
Admission: AD | Admit: 2021-04-08 | Discharge: 2021-04-08 | Disposition: A | Discharge status: Home / Self Care | Payer: Medicaid Other | Attending: Obstetrics & Gynecology | Admitting: Obstetrics & Gynecology

## 2021-04-08 ENCOUNTER — Encounter (HOSPITAL_COMMUNITY): Payer: Self-pay | Admitting: Obstetrics & Gynecology

## 2021-04-08 ENCOUNTER — Inpatient Hospital Stay (HOSPITAL_BASED_OUTPATIENT_CLINIC_OR_DEPARTMENT_OTHER): Payer: Medicaid Other

## 2021-04-08 DIAGNOSIS — O99333 Smoking (tobacco) complicating pregnancy, third trimester: Secondary | ICD-10-CM | POA: Insufficient documentation

## 2021-04-08 DIAGNOSIS — F1120 Opioid dependence, uncomplicated: Secondary | ICD-10-CM | POA: Diagnosis not present

## 2021-04-08 DIAGNOSIS — Z3689 Encounter for other specified antenatal screening: Secondary | ICD-10-CM | POA: Diagnosis not present

## 2021-04-08 DIAGNOSIS — O99323 Drug use complicating pregnancy, third trimester: Secondary | ICD-10-CM | POA: Diagnosis present

## 2021-04-08 DIAGNOSIS — Z79899 Other long term (current) drug therapy: Secondary | ICD-10-CM | POA: Diagnosis not present

## 2021-04-08 DIAGNOSIS — F17210 Nicotine dependence, cigarettes, uncomplicated: Secondary | ICD-10-CM | POA: Insufficient documentation

## 2021-04-08 DIAGNOSIS — Z3A36 36 weeks gestation of pregnancy: Secondary | ICD-10-CM | POA: Diagnosis not present

## 2021-04-08 NOTE — MAU Note (Signed)
Pt returned from [name]. Tech reported BPP 8/8.

## 2021-04-08 NOTE — MAU Provider Note (Signed)
History     CSN: [id number]  Arrival date and time: 04/08/21 1502   Event Date/Time   First Provider Initiated Contact with Patient 04/08/21 1542      Chief Complaint  Patient presents with  . Pregnancy Ultrasound   HPI Monica Vazquez is a 25 y.o. G2P0010 at [redacted]w[redacted]d who presents for a repeat BPP. She was seen at [facility] yesterday and had a BPP that was 4/8 with a reactive NST, 6/10 total. MFM recommended repeat BPP today and AFI. See MFM note in chart.  Patient is without complaint. Reports normal fetal movement. Denies any vaginal bleeding, discharge or leaking of fluid. Denies any pain.   OB History    Gravida  2   Para  0   Term  0   Preterm  0   AB  1   Living  0     SAB  1   IAB  0   Ectopic  0   Multiple  0   Live Births  0           Past Medical History:  Diagnosis Date  . ADHD (attention deficit hyperactivity disorder)   . 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 Claustrophobia     Past Surgical History:  Procedure Laterality Date  . implanon    . TYMPANOMASTOIDECTOMY Right 09/12/2015   Procedure: RIGHT MODIFIED RADICAL TYMPANOMASTOIDECTOMY;  Surgeon: Newman Pies, MD;  Location: [facility];  Service: ENT;  Laterality: Right;  . WISDOM TOOTH EXTRACTION      Family History  Problem Relation Age of Onset  . Hypertension Father   . Heart disease Father     Social History   Tobacco Use  . Smoking status: Current Every Day Smoker    Packs/day: 0.00    Years: 3.00    Pack years: 0.00    Types: Cigarettes  . Smokeless tobacco: Never Used  . Tobacco comment: smokes 3-4 cig daily  Vaping Use  . Vaping Use: Former  . Substances: Nicotine  Substance Use Topics  . Alcohol use: No  . Drug use: Yes    Types: Oxycodone, Cocaine, Marijuana    Comment: suboxone, just using marijuana     Allergies:  Allergies  Allergen Reactions  . Hydrocodone Itching    SKIN BURNING    Medications Prior to Admission  Medication Sig Dispense  Refill Last Dose  . Buprenorphine HCl-Naloxone HCl (SUBOXONE) 8-2 MG FILM Place 1 Film under the tongue in the morning and at bedtime for 15 days. (Patient taking differently: Place 8 mg of opioid under the tongue in the morning and at bedtime.) 30 each 0 04/08/2021 at 1100  . famotidine (PEPCID) 20 MG tablet Take 1 tablet (20 mg total) by mouth daily. 30 tablet 1 04/08/2021 at 1100  . ondansetron (ZOFRAN ODT) 4 MG disintegrating tablet Take 1 tablet (4 mg total) by mouth every 8 (eight) hours as needed for nausea or vomiting. 60 tablet 2 04/08/2021 at 1100  . Prenatal Vit-Fe Fumarate-FA (PRENATAL VITAMINS) 28-0.8 MG TABS Take 1 tablet by mouth daily. 30 tablet 11 04/08/2021 at 1100  . Blood Pressure Monitor MISC For regular home bp monitoring during pregnancy 1 each 0   . naloxone (NARCAN) nasal spray 4 mg/0.1 mL Use in case of overdose 1 each 1     Review of Systems  Constitutional: Negative.  Negative for fatigue and fever.  HENT: Negative.   Respiratory: Negative.  Negative for shortness of breath.  Cardiovascular: Negative.  Negative for chest pain.  Gastrointestinal: Negative.  Negative for abdominal pain, constipation, diarrhea, nausea and vomiting.  Genitourinary: Negative.  Negative for dysuria, vaginal bleeding and vaginal discharge.  Neurological: Negative.  Negative for dizziness and headaches.   Physical Exam   Blood pressure 118/88, pulse 86, temperature 98 F (36.7 C), temperature source Oral, resp. rate 18, last menstrual period 07/17/2020, SpO2 94 %.  Physical Exam Vitals and nursing note reviewed.  Constitutional:      General: She is not in acute distress.    Appearance: She is well-developed.  HENT:     Head: Normocephalic.  Eyes:     Pupils: Pupils are equal, round, and reactive to light.  Cardiovascular:     Rate and Rhythm: Normal rate and regular rhythm.     Heart sounds: Normal heart sounds.  Pulmonary:     Effort: Pulmonary effort is normal. No respiratory  distress.     Breath sounds: Normal breath sounds.  Abdominal:     General: Bowel sounds are normal. There is no distension.     Palpations: Abdomen is soft.     Tenderness: There is no abdominal tenderness.  Skin:    General: Skin is warm and dry.  Neurological:     Mental Status: She is alert and oriented to person, place, and time.  Psychiatric:        Behavior: Behavior normal.        Thought Content: Thought content normal.        Judgment: Judgment normal.     MAU Course  Procedures  MDM NST- reactive [name] MFM BPP- 8/8, AFI 10cm, max vertical pocket greater than 3cm  Consulted with Dr. Despina Hidden regarding plan of care- agreeable to discharge patient home and follow up as scheduled in the office this week.   Assessment and Plan   1. NST (non-stress test) reactive   2. [redacted] weeks gestation of pregnancy   3. Suboxone maintenance treatment complicating pregnancy, antepartum (HCC)    -Discharge home in stable condition -Labor precautions discussed -Patient advised to follow-up with OB this week as scheduled for NST/AFI -Patient may return to MAU as needed or if her condition were to change or worsen   Rolm Bookbinder CNM 04/08/2021, 3:43 PM

## 2021-04-08 NOTE — MAU Note (Signed)
Pt reports to mau for follow up BPP to check her fluid level.  Denies CTX, LOF, or vag bleeding. +FM

## 2021-04-08 NOTE — Discharge Instructions (Signed)
Safe Medications in Pregnancy   Acne: Benzoyl Peroxide Salicylic Acid  Backache/Headache: Tylenol: 2 regular strength every 4 hours OR              2 Extra strength every 6 hours  Colds/Coughs/Allergies: Benadryl (alcohol free) 25 mg every 6 hours as needed Breath right strips Claritin Cepacol throat lozenges Chloraseptic throat spray Cold-Eeze- up to three times per day Cough drops, alcohol free Flonase (by prescription only) Guaifenesin Mucinex Robitussin DM (plain only, alcohol free) Saline nasal spray/drops Sudafed (pseudoephedrine) & Actifed ** use only after [redacted] weeks gestation and if you do not have high blood pressure Tylenol Vicks Vaporub Zinc lozenges Zyrtec   Constipation: Colace Ducolax suppositories Fleet enema Glycerin suppositories Metamucil Milk of magnesia Miralax Senokot Smooth move tea  Diarrhea: Kaopectate Imodium A-D  *NO pepto Bismol  Hemorrhoids: Anusol Anusol HC Preparation H Tucks  Indigestion: Tums Maalox Mylanta Zantac  Pepcid  Insomnia: Benadryl (alcohol free) 25mg  every 6 hours as needed Tylenol PM Unisom, no Gelcaps  Leg Cramps: Tums MagGel  Nausea/Vomiting:  Bonine Dramamine Emetrol Ginger extract Sea bands Meclizine  Nausea medication to take during pregnancy:  Unisom (doxylamine succinate 25 mg tablets) Take one tablet daily at bedtime. If symptoms are not adequately controlled, the dose can be increased to a maximum recommended dose of two tablets daily (1/2 tablet in the morning, 1/2 tablet mid-afternoon and one at bedtime). Vitamin B6 100mg  tablets. Take one tablet twice a day (up to 200 mg per day).  Skin Rashes: Aveeno products Benadryl cream or 25mg  every 6 hours as needed Calamine Lotion 1% cortisone cream  Yeast infection: Gyne-lotrimin 7 Monistat 7   **If taking multiple medications, please check labels to avoid duplicating the same active ingredients **take medication as directed on  the label ** Do not exceed 4000 mg of tylenol in 24 hours **Do not take medications that contain aspirin or ibuprofen     Fetal Movement Counts Patient Name: ________________________________________________ Patient Due Date: ____________________  What is a fetal movement count? A fetal movement count is the number of times that you feel your baby move during a certain amount of time. This may also be called a fetal kick count. A fetal movement count is recommended for every pregnant woman. You may be asked to start counting fetal movements as early as week 28 of your pregnancy. Pay attention to when your baby is most active. You may notice your baby's sleep and wake cycles. You may also notice things that make your baby move more. You should do a fetal movement count:  When your baby is normally most active.  At the same time each day. A good time to count movements is while you are resting, after having something to eat and drink. How do I count fetal movements? 1. Find a quiet, comfortable area. Sit, or lie down on your side. 2. Write down the date, the start time and stop time, and the number of movements that you felt between those two times. Take this information with you to your health care visits. 3. Write down your start time when you feel the first movement. 4. Count kicks, flutters, swishes, rolls, and jabs. You should feel at least 10 movements. 5. You may stop counting after you have felt 10 movements, or if you have been counting for 2 hours. Write down the stop time. 6. If you do not feel 10 movements in 2 hours, contact your health care provider for further instructions. Your health  care provider may want to do additional tests to assess your baby's well-being. Contact a health care provider if:  You feel fewer than 10 movements in 2 hours.  Your baby is not moving like he or she usually does. Date: ____________ Start time: ____________ Stop time: ____________ Movements:  ____________ Date: ____________ Start time: ____________ Stop time: ____________ Movements: ____________ Date: ____________ Start time: ____________ Stop time: ____________ Movements: ____________ Date: ____________ Start time: ____________ Stop time: ____________ Movements: ____________ Date: ____________ Start time: ____________ Stop time: ____________ Movements: ____________ Date: ____________ Start time: ____________ Stop time: ____________ Movements: ____________ Date: ____________ Start time: ____________ Stop time: ____________ Movements: ____________ Date: ____________ Start time: ____________ Stop time: ____________ Movements: ____________ Date: ____________ Start time: ____________ Stop time: ____________ Movements: ____________ This information is not intended to replace advice given to you by your health care provider. Make sure you discuss any questions you have with your health care provider. Document Revised: 07/09/2019 Document Reviewed: 07/09/2019 Elsevier Patient Education  2021 [employer].

## 2021-04-09 DIAGNOSIS — O358XX0 Maternal care for other (suspected) fetal abnormality and damage, not applicable or unspecified: Secondary | ICD-10-CM | POA: Diagnosis not present

## 2021-04-09 DIAGNOSIS — Z148 Genetic carrier of other disease: Secondary | ICD-10-CM

## 2021-04-09 DIAGNOSIS — F1910 Other psychoactive substance abuse, uncomplicated: Secondary | ICD-10-CM | POA: Diagnosis not present

## 2021-04-09 DIAGNOSIS — O99333 Smoking (tobacco) complicating pregnancy, third trimester: Secondary | ICD-10-CM | POA: Diagnosis not present

## 2021-04-09 DIAGNOSIS — O99323 Drug use complicating pregnancy, third trimester: Secondary | ICD-10-CM | POA: Diagnosis not present

## 2021-04-09 DIAGNOSIS — Z3A36 36 weeks gestation of pregnancy: Secondary | ICD-10-CM

## 2021-04-10 LAB — GC/CHLAMYDIA PROBE AMP (~~LOC~~) NOT AT ARMC
Chlamydia: NEGATIVE
Comment: NEGATIVE
Comment: NORMAL
Neisseria Gonorrhea: NEGATIVE

## 2021-04-11 ENCOUNTER — Encounter: Payer: Self-pay | Admitting: Family Medicine

## 2021-04-11 DIAGNOSIS — O99820 Streptococcus B carrier state complicating pregnancy: Secondary | ICD-10-CM | POA: Insufficient documentation

## 2021-04-11 LAB — CULTURE, BETA STREP (GROUP B ONLY): Strep Gp B Culture: POSITIVE — AB

## 2021-04-12 ENCOUNTER — Ambulatory Visit (INDEPENDENT_AMBULATORY_CARE_PROVIDER_SITE_OTHER): Payer: Medicaid Other

## 2021-04-12 ENCOUNTER — Ambulatory Visit: Payer: Medicaid Other | Admitting: *Deleted

## 2021-04-12 ENCOUNTER — Other Ambulatory Visit: Payer: Self-pay

## 2021-04-12 VITALS — BP 112/67 | HR 63 | Wt 198.0 lb

## 2021-04-12 DIAGNOSIS — F1120 Opioid dependence, uncomplicated: Secondary | ICD-10-CM

## 2021-04-12 DIAGNOSIS — O99320 Drug use complicating pregnancy, unspecified trimester: Secondary | ICD-10-CM

## 2021-04-12 DIAGNOSIS — O4103X0 Oligohydramnios, third trimester, not applicable or unspecified: Secondary | ICD-10-CM

## 2021-04-12 LAB — PAIN MGT SCRN (14 DRUGS), UR
Amphetamine Scrn, Ur: NEGATIVE ng/mL
BARBITURATE SCREEN URINE: NEGATIVE ng/mL
BENZODIAZEPINE SCREEN, URINE: NEGATIVE ng/mL
Buprenorphine, Urine: POSITIVE ng/mL — AB
CANNABINOIDS UR QL SCN: POSITIVE ng/mL — AB
Cocaine (Metab) Scrn, Ur: NEGATIVE ng/mL
Creatinine(Crt), U: 99.5 mg/dL (ref 20.0–300.0)
Fentanyl, Urine: NEGATIVE pg/mL
Meperidine Screen, Urine: NEGATIVE ng/mL
Methadone Screen, Urine: NEGATIVE ng/mL
OXYCODONE+OXYMORPHONE UR QL SCN: NEGATIVE ng/mL
Opiate Scrn, Ur: NEGATIVE ng/mL
Ph of Urine: 6.4 (ref 4.5–8.9)
Phencyclidine Qn, Ur: NEGATIVE ng/mL
Propoxyphene Scrn, Ur: NEGATIVE ng/mL
Tramadol Screen, Urine: NEGATIVE ng/mL

## 2021-04-12 NOTE — Progress Notes (Signed)

## 2021-04-21 ENCOUNTER — Ambulatory Visit (INDEPENDENT_AMBULATORY_CARE_PROVIDER_SITE_OTHER): Payer: Medicaid Other | Admitting: Family Medicine

## 2021-04-21 ENCOUNTER — Encounter: Payer: Self-pay | Admitting: Family Medicine

## 2021-04-21 VITALS — BP 116/80 | HR 85 | Wt 198.8 lb

## 2021-04-21 DIAGNOSIS — O0990 Supervision of high risk pregnancy, unspecified, unspecified trimester: Secondary | ICD-10-CM

## 2021-04-21 DIAGNOSIS — O99320 Drug use complicating pregnancy, unspecified trimester: Secondary | ICD-10-CM

## 2021-04-21 DIAGNOSIS — F1120 Opioid dependence, uncomplicated: Secondary | ICD-10-CM

## 2021-04-21 DIAGNOSIS — F17200 Nicotine dependence, unspecified, uncomplicated: Secondary | ICD-10-CM

## 2021-04-21 DIAGNOSIS — O99820 Streptococcus B carrier state complicating pregnancy: Secondary | ICD-10-CM

## 2021-04-21 MED ORDER — BUPRENORPHINE HCL-NALOXONE HCL 8-2 MG SL FILM: 8.0000 mg | ORAL_FILM | Freq: Two times a day (BID) | SUBLINGUAL | 0 refills | Status: DC

## 2021-04-21 NOTE — Patient Instructions (Signed)
 Contraception Choices Contraception, also called birth control, refers to methods or devices that prevent pregnancy. Hormonal methods Contraceptive implant A contraceptive implant is a thin, plastic tube that contains a hormone that prevents pregnancy. It is different from an intrauterine device (IUD). It is inserted into the upper part of the arm by a health care provider. Implants can be effective for up to 3 years. Progestin-only injections Progestin-only injections are injections of progestin, a synthetic form of the hormone progesterone. They are given every 3 months by a health care provider. Birth control pills Birth control pills are pills that contain hormones that prevent pregnancy. They must be taken once a day, preferably at the same time each day. A prescription is needed to use this method of contraception. Birth control patch The birth control patch contains hormones that prevent pregnancy. It is placed on the skin and must be changed once a week for three weeks and removed on the fourth week. A prescription is needed to use this method of contraception. Vaginal ring A vaginal ring contains hormones that prevent pregnancy. It is placed in the vagina for three weeks and removed on the fourth week. After that, the process is repeated with a new ring. A prescription is needed to use this method of contraception. Emergency contraceptive Emergency contraceptives prevent pregnancy after unprotected sex. They come in pill form and can be taken up to 5 days after sex. They work best the sooner they are taken after having sex. Most emergency contraceptives are available without a prescription. This method should not be used as your only form of birth control.   Barrier methods Female condom A female condom is a thin sheath that is worn over the penis during sex. Condoms keep sperm from going inside a woman's body. They can be used with a sperm-killing substance (spermicide) to increase their  effectiveness. They should be thrown away after one use. Female condom A female condom is a soft, loose-fitting sheath that is put into the vagina before sex. The condom keeps sperm from going inside a woman's body. They should be thrown away after one use. Diaphragm A diaphragm is a soft, dome-shaped barrier. It is inserted into the vagina before sex, along with a spermicide. The diaphragm blocks sperm from entering the uterus, and the spermicide kills sperm. A diaphragm should be left in the vagina for 6-8 hours after sex and removed within 24 hours. A diaphragm is prescribed and fitted by a health care provider. A diaphragm should be replaced every 1-2 years, after giving birth, after gaining more than 15 lb (6.8 kg), and after pelvic surgery. Cervical cap A cervical cap is a round, soft latex or plastic cup that fits over the cervix. It is inserted into the vagina before sex, along with spermicide. It blocks sperm from entering the uterus. The cap should be left in place for 6-8 hours after sex and removed within 48 hours. A cervical cap must be prescribed and fitted by a health care provider. It should be replaced every 2 years. Sponge A sponge is a soft, circular piece of polyurethane foam with spermicide in it. The sponge helps block sperm from entering the uterus, and the spermicide kills sperm. To use it, you make it wet and then insert it into the vagina. It should be inserted before sex, left in for at least 6 hours after sex, and removed and thrown away within 30 hours. Spermicides Spermicides are chemicals that kill or block sperm from entering the   cervix and uterus. They can come as a cream, jelly, suppository, foam, or tablet. A spermicide should be inserted into the vagina with an applicator at least 10-15 minutes before sex to allow time for it to work. The process must be repeated every time you have sex. Spermicides do not require a prescription.   Intrauterine  contraception Intrauterine device (IUD) An IUD is a T-shaped device that is put in a woman's uterus. There are two types:  Hormone IUD.This type contains progestin, a synthetic form of the hormone progesterone. This type can stay in place for 3-5 years.  Copper IUD.This type is wrapped in copper wire. It can stay in place for 10 years. Permanent methods of contraception Female tubal ligation In this method, a woman's fallopian tubes are sealed, tied, or blocked during surgery to prevent eggs from traveling to the uterus. Hysteroscopic sterilization In this method, a small, flexible insert is placed into each fallopian tube. The inserts cause scar tissue to form in the fallopian tubes and block them, so sperm cannot reach an egg. The procedure takes about 3 months to be effective. Another form of birth control must be used during those 3 months. Female sterilization This is a procedure to tie off the tubes that carry sperm (vasectomy). After the procedure, the man can still ejaculate fluid (semen). Another form of birth control must be used for 3 months after the procedure. Natural planning methods Natural family planning In this method, a couple does not have sex on days when the woman could become pregnant. Calendar method In this method, the woman keeps track of the length of each menstrual cycle, identifies the days when pregnancy can happen, and does not have sex on those days. Ovulation method In this method, a couple avoids sex during ovulation. Symptothermal method This method involves not having sex during ovulation. The woman typically checks for ovulation by watching changes in her temperature and in the consistency of cervical mucus. Post-ovulation method In this method, a couple waits to have sex until after ovulation. Where to find more information  Centers for Disease Control and Prevention: www.cdc.gov Summary  Contraception, also called birth control, refers to methods or  devices that prevent pregnancy.  Hormonal methods of contraception include implants, injections, pills, patches, vaginal rings, and emergency contraceptives.  Barrier methods of contraception can include female condoms, female condoms, diaphragms, cervical caps, sponges, and spermicides.  There are two types of IUDs (intrauterine devices). An IUD can be put in a woman's uterus to prevent pregnancy for 3-5 years.  Permanent sterilization can be done through a procedure for males and females. Natural family planning methods involve nothaving sex on days when the woman could become pregnant. This information is not intended to replace advice given to you by your health care provider. Make sure you discuss any questions you have with your health care provider. Document Revised: 04/25/2020 Document Reviewed: 04/25/2020 Elsevier Patient Education  2021 Elsevier Inc.   Breastfeeding  Choosing to breastfeed is one of the best decisions you can make for yourself and your baby. A change in hormones during pregnancy causes your breasts to make breast milk in your milk-producing glands. Hormones prevent breast milk from being released before your baby is born. They also prompt milk flow after birth. Once breastfeeding has begun, thoughts of your baby, as well as his or her sucking or crying, can stimulate the release of milk from your milk-producing glands. Benefits of breastfeeding Research shows that breastfeeding offers many health benefits   for infants and mothers. It also offers a cost-free and convenient way to feed your baby. For your baby  Your first milk (colostrum) helps your baby's digestive system to function better.  Special cells in your milk (antibodies) help your baby to fight off infections.  Breastfed babies are less likely to develop asthma, allergies, obesity, or type 2 diabetes. They are also at lower risk for sudden infant death syndrome (SIDS).  Nutrients in breast milk are better  able to meet your baby's needs compared to infant formula.  Breast milk improves your baby's brain development. For you  Breastfeeding helps to create a very special bond between you and your baby.  Breastfeeding is convenient. Breast milk costs nothing and is always available at the correct temperature.  Breastfeeding helps to burn calories. It helps you to lose the weight that you gained during pregnancy.  Breastfeeding makes your uterus return faster to its size before pregnancy. It also slows bleeding (lochia) after you give birth.  Breastfeeding helps to lower your risk of developing type 2 diabetes, osteoporosis, rheumatoid arthritis, cardiovascular disease, and breast, ovarian, uterine, and endometrial cancer later in life. Breastfeeding basics Starting breastfeeding  Find a comfortable place to sit or lie down, with your neck and back well-supported.  Place a pillow or a rolled-up blanket under your baby to bring him or her to the level of your breast (if you are seated). Nursing pillows are specially designed to help support your arms and your baby while you breastfeed.  Make sure that your baby's tummy (abdomen) is facing your abdomen.  Gently massage your breast. With your fingertips, massage from the outer edges of your breast inward toward the nipple. This encourages milk flow. If your milk flows slowly, you may need to continue this action during the feeding.  Support your breast with 4 fingers underneath and your thumb above your nipple (make the letter "C" with your hand). Make sure your fingers are well away from your nipple and your baby's mouth.  Stroke your baby's lips gently with your finger or nipple.  When your baby's mouth is open wide enough, quickly bring your baby to your breast, placing your entire nipple and as much of the areola as possible into your baby's mouth. The areola is the colored area around your nipple. ? More areola should be visible above your  baby's upper lip than below the lower lip. ? Your baby's lips should be opened and extended outward (flanged) to ensure an adequate, comfortable latch. ? Your baby's tongue should be between his or her lower gum and your breast.  Make sure that your baby's mouth is correctly positioned around your nipple (latched). Your baby's lips should create a seal on your breast and be turned out (everted).  It is common for your baby to suck about 2-3 minutes in order to start the flow of breast milk. Latching Teaching your baby how to latch onto your breast properly is very important. An improper latch can cause nipple pain, decreased milk supply, and poor weight gain in your baby. Also, if your baby is not latched onto your nipple properly, he or she may swallow some air during feeding. This can make your baby fussy. Burping your baby when you switch breasts during the feeding can help to get rid of the air. However, teaching your baby to latch on properly is still the best way to prevent fussiness from swallowing air while breastfeeding. Signs that your baby has successfully latched onto   your nipple  Silent tugging or silent sucking, without causing you pain. Infant's lips should be extended outward (flanged).  Swallowing heard between every 3-4 sucks once your milk has started to flow (after your let-down milk reflex occurs).  Muscle movement above and in front of his or her ears while sucking. Signs that your baby has not successfully latched onto your nipple  Sucking sounds or smacking sounds from your baby while breastfeeding.  Nipple pain. If you think your baby has not latched on correctly, slip your finger into the corner of your baby's mouth to break the suction and place it between your baby's gums. Attempt to start breastfeeding again. Signs of successful breastfeeding Signs from your baby  Your baby will gradually decrease the number of sucks or will completely stop sucking.  Your baby  will fall asleep.  Your baby's body will relax.  Your baby will retain a small amount of milk in his or her mouth.  Your baby will let go of your breast by himself or herself. Signs from you  Breasts that have increased in firmness, weight, and size 1-3 hours after feeding.  Breasts that are softer immediately after breastfeeding.  Increased milk volume, as well as a change in milk consistency and color by the fifth day of breastfeeding.  Nipples that are not sore, cracked, or bleeding. Signs that your baby is getting enough milk  Wetting at least 1-2 diapers during the first 24 hours after birth.  Wetting at least 5-6 diapers every 24 hours for the first week after birth. The urine should be clear or pale yellow by the age of 5 days.  Wetting 6-8 diapers every 24 hours as your baby continues to grow and develop.  At least 3 stools in a 24-hour period by the age of 5 days. The stool should be soft and yellow.  At least 3 stools in a 24-hour period by the age of 7 days. The stool should be seedy and yellow.  No loss of weight greater than 10% of birth weight during the first 3 days of life.  Average weight gain of 4-7 oz (113-198 g) per week after the age of 4 days.  Consistent daily weight gain by the age of 5 days, without weight loss after the age of 2 weeks. After a feeding, your baby may spit up a small amount of milk. This is normal. Breastfeeding frequency and duration Frequent feeding will help you make more milk and can prevent sore nipples and extremely full breasts (breast engorgement). Breastfeed when you feel the need to reduce the fullness of your breasts or when your baby shows signs of hunger. This is called "breastfeeding on demand." Signs that your baby is hungry include:  Increased alertness, activity, or restlessness.  Movement of the head from side to side.  Opening of the mouth when the corner of the mouth or cheek is stroked (rooting).  Increased  sucking sounds, smacking lips, cooing, sighing, or squeaking.  Hand-to-mouth movements and sucking on fingers or hands.  Fussing or crying. Avoid introducing a pacifier to your baby in the first 4-6 weeks after your baby is born. After this time, you may choose to use a pacifier. Research has shown that pacifier use during the first year of a baby's life decreases the risk of sudden infant death syndrome (SIDS). Allow your baby to feed on each breast as long as he or she wants. When your baby unlatches or falls asleep while feeding from the   first breast, offer the second breast. Because newborns are often sleepy in the first few weeks of life, you may need to awaken your baby to get him or her to feed. Breastfeeding times will vary from baby to baby. However, the following rules can serve as a guide to help you make sure that your baby is properly fed:  Newborns (babies 4 weeks of age or younger) may breastfeed every 1-3 hours.  Newborns should not go without breastfeeding for longer than 3 hours during the day or 5 hours during the night.  You should breastfeed your baby a minimum of 8 times in a 24-hour period. Breast milk pumping Pumping and storing breast milk allows you to make sure that your baby is exclusively fed your breast milk, even at times when you are unable to breastfeed. This is especially important if you go back to work while you are still breastfeeding, or if you are not able to be present during feedings. Your lactation consultant can help you find a method of pumping that works best for you and give you guidelines about how long it is safe to store breast milk.      Caring for your breasts while you breastfeed Nipples can become dry, cracked, and sore while breastfeeding. The following recommendations can help keep your breasts moisturized and healthy:  Avoid using soap on your nipples.  Wear a supportive bra designed especially for nursing. Avoid wearing underwire-style  bras or extremely tight bras (sports bras).  Air-dry your nipples for 3-4 minutes after each feeding.  Use only cotton bra pads to absorb leaked breast milk. Leaking of breast milk between feedings is normal.  Use lanolin on your nipples after breastfeeding. Lanolin helps to maintain your skin's normal moisture barrier. Pure lanolin is not harmful (not toxic) to your baby. You may also hand express a few drops of breast milk and gently massage that milk into your nipples and allow the milk to air-dry. In the first few weeks after giving birth, some women experience breast engorgement. Engorgement can make your breasts feel heavy, warm, and tender to the touch. Engorgement peaks within 3-5 days after you give birth. The following recommendations can help to ease engorgement:  Completely empty your breasts while breastfeeding or pumping. You may want to start by applying warm, moist heat (in the shower or with warm, water-soaked hand towels) just before feeding or pumping. This increases circulation and helps the milk flow. If your baby does not completely empty your breasts while breastfeeding, pump any extra milk after he or she is finished.  Apply ice packs to your breasts immediately after breastfeeding or pumping, unless this is too uncomfortable for you. To do this: ? Put ice in a plastic bag. ? Place a towel between your skin and the bag. ? Leave the ice on for 20 minutes, 2-3 times a day.  Make sure that your baby is latched on and positioned properly while breastfeeding. If engorgement persists after 48 hours of following these recommendations, contact your health care provider or a lactation consultant. Overall health care recommendations while breastfeeding  Eat 3 healthy meals and 3 snacks every day. Well-nourished mothers who are breastfeeding need an additional 450-500 calories a day. You can meet this requirement by increasing the amount of a balanced diet that you eat.  Drink  enough water to keep your urine pale yellow or clear.  Rest often, relax, and continue to take your prenatal vitamins to prevent fatigue, stress, and low   vitamin and mineral levels in your body (nutrient deficiencies).  Do not use any products that contain nicotine or tobacco, such as cigarettes and e-cigarettes. Your baby may be harmed by chemicals from cigarettes that pass into breast milk and exposure to secondhand smoke. If you need help quitting, ask your health care provider.  Avoid alcohol.  Do not use illegal drugs or marijuana.  Talk with your health care provider before taking any medicines. These include over-the-counter and prescription medicines as well as vitamins and herbal supplements. Some medicines that may be harmful to your baby can pass through breast milk.  It is possible to become pregnant while breastfeeding. If birth control is desired, ask your health care provider about options that will be safe while breastfeeding your baby. Where to find more information: [employer]: www.llli.org Contact a health care provider if:  You feel like you want to stop breastfeeding or have become frustrated with breastfeeding.  Your nipples are cracked or bleeding.  Your breasts are red, tender, or warm.  You have: ? Painful breasts or nipples. ? A swollen area on either breast. ? A fever or chills. ? Nausea or vomiting. ? Drainage other than breast milk from your nipples.  Your breasts do not become full before feedings by the fifth day after you give birth.  You feel sad and depressed.  Your baby is: ? Too sleepy to eat well. ? Having trouble sleeping. ? More than 1 week old and wetting fewer than 6 diapers in a 24-hour period. ? Not gaining weight by 5 days of age.  Your baby has fewer than 3 stools in a 24-hour period.  Your baby's skin or the white parts of his or her eyes become yellow. Get help right away if:  Your baby is overly tired  (lethargic) and does not want to wake up and feed.  Your baby develops an unexplained fever. Summary  Breastfeeding offers many health benefits for infant and mothers.  Try to breastfeed your infant when he or she shows early signs of hunger.  Gently tickle or stroke your baby's lips with your finger or nipple to allow the baby to open his or her mouth. Bring the baby to your breast. Make sure that much of the areola is in your baby's mouth. Offer one side and burp the baby before you offer the other side.  Talk with your health care provider or lactation consultant if you have questions or you face problems as you breastfeed. This information is not intended to replace advice given to you by your health care provider. Make sure you discuss any questions you have with your health care provider. Document Revised: 02/13/2018 Document Reviewed: 12/21/2016 Elsevier Patient Education  2021 Elsevier Inc.  

## 2021-04-21 NOTE — Progress Notes (Signed)
   Subjective:  Monica Vazquez is a 25 y.o. G2P0010 at [redacted]w[redacted]d being seen today for ongoing prenatal care.  She is currently monitored for the following issues for this high-risk pregnancy and has Supervision of high risk pregnancy, antepartum; Suboxone maintenance treatment complicating pregnancy, antepartum (HCC); Smoker; Anxiety; Maternal UTI (urinary tract infection), first trimester; Rubella non-immune status, antepartum; Cocaine abuse affecting pregnancy in first trimester (HCC); Marijuana use; Abn chromsoml and genetic find on antenat screen of mother; and GBS (group B Streptococcus carrier), +RV culture, currently pregnant on their problem list.  Patient reports no complaints.  Contractions: Irregular. Vag. Bleeding: None.  Movement: Present. Denies leaking of fluid.   The following portions of the patient's history were reviewed and updated as appropriate: allergies, current medications, past family history, past medical history, past social history, past surgical history and problem list. Problem list updated.  Objective:   Vitals:   04/21/21 1114  BP: 116/80  Pulse: 85  Weight: 198 lb 12.8 oz (90.2 kg)    Fetal Status: Fetal Heart Rate (bpm): 144   Movement: Present     General:  Alert, oriented and cooperative. Patient is in no acute distress.  Skin: Skin is warm and dry. No rash noted.   Cardiovascular: Normal heart rate noted  Respiratory: Normal respiratory effort, no problems with respiration noted  Abdomen: Soft, gravid, appropriate for gestational age. Pain/Pressure: Absent     Pelvic: Vag. Bleeding: None     Cervical exam performed        Extremities: Normal range of motion.  Edema: Trace  Mental Status: Normal mood and affect. Normal behavior. Normal judgment and thought content.   Urinalysis:      Assessment and Plan:  Pregnancy: G2P0010 at [redacted]w[redacted]d  1. Suboxone maintenance treatment complicating pregnancy, antepartum (HCC) Stable on 8 BID UDS today Refill  sent Will schedule for repeat BPP for next week  2. Supervision of high risk pregnancy, antepartum Some contractions Cervix is closed but baby at -1 BP and FHR normal  3. Smoker Cutting down  4. GBS (group B Streptococcus carrier), +RV culture, currently pregnant Prophylaxis in labor  Term labor symptoms and general obstetric precautions including but not limited to vaginal bleeding, contractions, leaking of fluid and fetal movement were reviewed in detail with the patient. Please refer to After Visit Summary for other counseling recommendations.  Return in 1 week (on 04/28/2021) for [facility], ob visit.   Venora Maples, MD

## 2021-04-22 LAB — PAIN MGT SCRN (14 DRUGS), UR
Amphetamine Scrn, Ur: NEGATIVE ng/mL
BARBITURATE SCREEN URINE: NEGATIVE ng/mL
BENZODIAZEPINE SCREEN, URINE: NEGATIVE ng/mL
Buprenorphine, Urine: POSITIVE ng/mL — AB
CANNABINOIDS UR QL SCN: POSITIVE ng/mL — AB
Cocaine (Metab) Scrn, Ur: POSITIVE ng/mL — AB
Creatinine(Crt), U: 37.2 mg/dL (ref 20.0–300.0)
Fentanyl, Urine: NEGATIVE pg/mL
Meperidine Screen, Urine: NEGATIVE ng/mL
Methadone Screen, Urine: NEGATIVE ng/mL
OXYCODONE+OXYMORPHONE UR QL SCN: POSITIVE ng/mL — AB
Opiate Scrn, Ur: NEGATIVE ng/mL
Ph of Urine: 6.5 (ref 4.5–8.9)
Phencyclidine Qn, Ur: NEGATIVE ng/mL
Propoxyphene Scrn, Ur: NEGATIVE ng/mL
Tramadol Screen, Urine: NEGATIVE ng/mL

## 2021-04-24 ENCOUNTER — Other Ambulatory Visit: Payer: Self-pay | Admitting: Family Medicine

## 2021-04-24 DIAGNOSIS — Z3480 Encounter for supervision of other normal pregnancy, unspecified trimester: Secondary | ICD-10-CM

## 2021-04-25 ENCOUNTER — Ambulatory Visit (INDEPENDENT_AMBULATORY_CARE_PROVIDER_SITE_OTHER): Payer: Medicaid Other

## 2021-04-25 ENCOUNTER — Ambulatory Visit (INDEPENDENT_AMBULATORY_CARE_PROVIDER_SITE_OTHER): Payer: Medicaid Other | Admitting: Family Medicine

## 2021-04-25 ENCOUNTER — Encounter: Payer: Self-pay | Admitting: Family Medicine

## 2021-04-25 ENCOUNTER — Other Ambulatory Visit: Payer: Self-pay

## 2021-04-25 VITALS — BP 128/80 | HR 76 | Wt 199.0 lb

## 2021-04-25 DIAGNOSIS — O99320 Drug use complicating pregnancy, unspecified trimester: Secondary | ICD-10-CM

## 2021-04-25 DIAGNOSIS — O99321 Drug use complicating pregnancy, first trimester: Secondary | ICD-10-CM

## 2021-04-25 DIAGNOSIS — Z2839 Other underimmunization status: Secondary | ICD-10-CM

## 2021-04-25 DIAGNOSIS — F1290 Cannabis use, unspecified, uncomplicated: Secondary | ICD-10-CM

## 2021-04-25 DIAGNOSIS — O99820 Streptococcus B carrier state complicating pregnancy: Secondary | ICD-10-CM

## 2021-04-25 DIAGNOSIS — O2341 Unspecified infection of urinary tract in pregnancy, first trimester: Secondary | ICD-10-CM

## 2021-04-25 DIAGNOSIS — F1120 Opioid dependence, uncomplicated: Secondary | ICD-10-CM

## 2021-04-25 DIAGNOSIS — F1410 Cocaine abuse, uncomplicated: Secondary | ICD-10-CM

## 2021-04-25 DIAGNOSIS — Z3480 Encounter for supervision of other normal pregnancy, unspecified trimester: Secondary | ICD-10-CM

## 2021-04-25 DIAGNOSIS — O0990 Supervision of high risk pregnancy, unspecified, unspecified trimester: Secondary | ICD-10-CM

## 2021-04-25 MED ORDER — FAMOTIDINE 20 MG PO TABS
20.0000 mg | ORAL_TABLET | Freq: Every day | ORAL | 1 refills | Status: DC
Start: 2021-04-25 — End: 2021-05-06

## 2021-04-25 NOTE — Progress Notes (Signed)
Pt informed that the ultrasound is considered a limited OB ultrasound and is not intended to be a complete ultrasound exam.  Patient also informed that the ultrasound is not being completed with the intent of assessing for fetal or placental anomalies or any pelvic abnormalities.  Explained that the purpose of today's ultrasound is to assess for  BPP, presentation and AFI.  Patient acknowledges the purpose of the exam and the limitations of the study.    Chase Caller RN BSN 04/25/21

## 2021-04-25 NOTE — Progress Notes (Signed)
   Subjective:  Monica Vazquez is a 25 y.o. G2P0010 at [redacted]w[redacted]d being seen today for ongoing prenatal care.  She is currently monitored for the following issues for this high-risk pregnancy and has Supervision of high risk pregnancy, antepartum; Suboxone maintenance treatment complicating pregnancy, antepartum (HCC); Smoker; Anxiety; Maternal UTI (urinary tract infection), first trimester; Rubella non-immune status, antepartum; Cocaine abuse affecting pregnancy in first trimester (HCC); Marijuana use; Abn chromsoml and genetic find on antenat screen of mother; and GBS (group B Streptococcus carrier), +RV culture, currently pregnant on their problem list.  Patient reports no complaints.  Contractions: Irregular. Vag. Bleeding: None.  Movement: Present. Denies leaking of fluid.   The following portions of the patient's history were reviewed and updated as appropriate: allergies, current medications, past family history, past medical history, past social history, past surgical history and problem list. Problem list updated.  Objective:   Vitals:   04/25/21 0858  BP: 128/80  Pulse: 76  Weight: 199 lb (90.3 kg)    Fetal Status: Fetal Heart Rate (bpm): NST   Movement: Present  Presentation: Vertex  General:  Alert, oriented and cooperative. Patient is in no acute distress.  Skin: Skin is warm and dry. No rash noted.   Cardiovascular: Normal heart rate noted  Respiratory: Normal respiratory effort, no problems with respiration noted  Abdomen: Soft, gravid, appropriate for gestational age. Pain/Pressure: Present     Pelvic: Vag. Bleeding: None     Cervical exam deferred        Extremities: Normal range of motion.  Edema: Mild pitting, slight indentation  Mental Status: Normal mood and affect. Normal behavior. Normal judgment and thought content.   Urinalysis:      Assessment and Plan:  Pregnancy: G2P0010 at [redacted]w[redacted]d  1. Suboxone maintenance treatment complicating pregnancy, antepartum  (HCC) Stable on 8mg  BID, has sufficient until visit next week Reviewed UDS from last visit with patient that showed +oxycodone, MJ, cocaine MJ likely laced w cocaine Oxycodone very unexpected, asked patient if she knew why it was there and she was very honest and forthcoming Reported that her partner had been incarcerated, she was very stressed, and her back was hurting so she obtained an oxycodone Doing better now, partner is no longer incarcerated We discussed that OUD often comes with maladaptive coping responses to stress and that stress can often be a trigger for relapse Encouraged her to call if she is worried about relapse Discussed briefly how this may impact DCF proceedings but no way to know - FETAL BPP W/NONSTRESS; Future  2. Supervision of high risk pregnancy, antepartum BP and FHR normal BPP 8/8 today, will continue weekly testing IOL scheduled for [redacted]w[redacted]d, orders placed  3. Cocaine abuse affecting pregnancy in first trimester (HCC)   4. Marijuana use   5. Rubella non-immune status, antepartum   6. GBS (group B Streptococcus carrier), +RV culture, currently pregnant   7. Supervision of other normal pregnancy, antepartum - famotidine (PEPCID) 20 MG tablet; Take 1 tablet (20 mg total) by mouth daily.  Dispense: 30 tablet; Refill: 1  Term labor symptoms and general obstetric precautions including but not limited to vaginal bleeding, contractions, leaking of fluid and fetal movement were reviewed in detail with the patient. Please refer to After Visit Summary for other counseling recommendations.  Return in 1 week (on 05/02/2021) for [facility], ob visit, needs MD.   [facility], MD

## 2021-04-25 NOTE — Patient Instructions (Signed)
 Contraception Choices Contraception, also called birth control, refers to methods or devices that prevent pregnancy. Hormonal methods Contraceptive implant A contraceptive implant is a thin, plastic tube that contains a hormone that prevents pregnancy. It is different from an intrauterine device (IUD). It is inserted into the upper part of the arm by a health care provider. Implants can be effective for up to 3 years. Progestin-only injections Progestin-only injections are injections of progestin, a synthetic form of the hormone progesterone. They are given every 3 months by a health care provider. Birth control pills Birth control pills are pills that contain hormones that prevent pregnancy. They must be taken once a day, preferably at the same time each day. A prescription is needed to use this method of contraception. Birth control patch The birth control patch contains hormones that prevent pregnancy. It is placed on the skin and must be changed once a week for three weeks and removed on the fourth week. A prescription is needed to use this method of contraception. Vaginal ring A vaginal ring contains hormones that prevent pregnancy. It is placed in the vagina for three weeks and removed on the fourth week. After that, the process is repeated with a new ring. A prescription is needed to use this method of contraception. Emergency contraceptive Emergency contraceptives prevent pregnancy after unprotected sex. They come in pill form and can be taken up to 5 days after sex. They work best the sooner they are taken after having sex. Most emergency contraceptives are available without a prescription. This method should not be used as your only form of birth control.   Barrier methods Female condom A female condom is a thin sheath that is worn over the penis during sex. Condoms keep sperm from going inside a woman's body. They can be used with a sperm-killing substance (spermicide) to increase their  effectiveness. They should be thrown away after one use. Female condom A female condom is a soft, loose-fitting sheath that is put into the vagina before sex. The condom keeps sperm from going inside a woman's body. They should be thrown away after one use. Diaphragm A diaphragm is a soft, dome-shaped barrier. It is inserted into the vagina before sex, along with a spermicide. The diaphragm blocks sperm from entering the uterus, and the spermicide kills sperm. A diaphragm should be left in the vagina for 6-8 hours after sex and removed within 24 hours. A diaphragm is prescribed and fitted by a health care provider. A diaphragm should be replaced every 1-2 years, after giving birth, after gaining more than 15 lb (6.8 kg), and after pelvic surgery. Cervical cap A cervical cap is a round, soft latex or plastic cup that fits over the cervix. It is inserted into the vagina before sex, along with spermicide. It blocks sperm from entering the uterus. The cap should be left in place for 6-8 hours after sex and removed within 48 hours. A cervical cap must be prescribed and fitted by a health care provider. It should be replaced every 2 years. Sponge A sponge is a soft, circular piece of polyurethane foam with spermicide in it. The sponge helps block sperm from entering the uterus, and the spermicide kills sperm. To use it, you make it wet and then insert it into the vagina. It should be inserted before sex, left in for at least 6 hours after sex, and removed and thrown away within 30 hours. Spermicides Spermicides are chemicals that kill or block sperm from entering the   cervix and uterus. They can come as a cream, jelly, suppository, foam, or tablet. A spermicide should be inserted into the vagina with an applicator at least 10-15 minutes before sex to allow time for it to work. The process must be repeated every time you have sex. Spermicides do not require a prescription.   Intrauterine  contraception Intrauterine device (IUD) An IUD is a T-shaped device that is put in a woman's uterus. There are two types:  Hormone IUD.This type contains progestin, a synthetic form of the hormone progesterone. This type can stay in place for 3-5 years.  Copper IUD.This type is wrapped in copper wire. It can stay in place for 10 years. Permanent methods of contraception Female tubal ligation In this method, a woman's fallopian tubes are sealed, tied, or blocked during surgery to prevent eggs from traveling to the uterus. Hysteroscopic sterilization In this method, a small, flexible insert is placed into each fallopian tube. The inserts cause scar tissue to form in the fallopian tubes and block them, so sperm cannot reach an egg. The procedure takes about 3 months to be effective. Another form of birth control must be used during those 3 months. Female sterilization This is a procedure to tie off the tubes that carry sperm (vasectomy). After the procedure, the man can still ejaculate fluid (semen). Another form of birth control must be used for 3 months after the procedure. Natural planning methods Natural family planning In this method, a couple does not have sex on days when the woman could become pregnant. Calendar method In this method, the woman keeps track of the length of each menstrual cycle, identifies the days when pregnancy can happen, and does not have sex on those days. Ovulation method In this method, a couple avoids sex during ovulation. Symptothermal method This method involves not having sex during ovulation. The woman typically checks for ovulation by watching changes in her temperature and in the consistency of cervical mucus. Post-ovulation method In this method, a couple waits to have sex until after ovulation. Where to find more information  Centers for Disease Control and Prevention: www.cdc.gov Summary  Contraception, also called birth control, refers to methods or  devices that prevent pregnancy.  Hormonal methods of contraception include implants, injections, pills, patches, vaginal rings, and emergency contraceptives.  Barrier methods of contraception can include female condoms, female condoms, diaphragms, cervical caps, sponges, and spermicides.  There are two types of IUDs (intrauterine devices). An IUD can be put in a woman's uterus to prevent pregnancy for 3-5 years.  Permanent sterilization can be done through a procedure for males and females. Natural family planning methods involve nothaving sex on days when the woman could become pregnant. This information is not intended to replace advice given to you by your health care provider. Make sure you discuss any questions you have with your health care provider. Document Revised: 04/25/2020 Document Reviewed: 04/25/2020 Elsevier Patient Education  2021 Elsevier Inc.   Breastfeeding  Choosing to breastfeed is one of the best decisions you can make for yourself and your baby. A change in hormones during pregnancy causes your breasts to make breast milk in your milk-producing glands. Hormones prevent breast milk from being released before your baby is born. They also prompt milk flow after birth. Once breastfeeding has begun, thoughts of your baby, as well as his or her sucking or crying, can stimulate the release of milk from your milk-producing glands. Benefits of breastfeeding Research shows that breastfeeding offers many health benefits   for infants and mothers. It also offers a cost-free and convenient way to feed your baby. For your baby  Your first milk (colostrum) helps your baby's digestive system to function better.  Special cells in your milk (antibodies) help your baby to fight off infections.  Breastfed babies are less likely to develop asthma, allergies, obesity, or type 2 diabetes. They are also at lower risk for sudden infant death syndrome (SIDS).  Nutrients in breast milk are better  able to meet your baby's needs compared to infant formula.  Breast milk improves your baby's brain development. For you  Breastfeeding helps to create a very special bond between you and your baby.  Breastfeeding is convenient. Breast milk costs nothing and is always available at the correct temperature.  Breastfeeding helps to burn calories. It helps you to lose the weight that you gained during pregnancy.  Breastfeeding makes your uterus return faster to its size before pregnancy. It also slows bleeding (lochia) after you give birth.  Breastfeeding helps to lower your risk of developing type 2 diabetes, osteoporosis, rheumatoid arthritis, cardiovascular disease, and breast, ovarian, uterine, and endometrial cancer later in life. Breastfeeding basics Starting breastfeeding  Find a comfortable place to sit or lie down, with your neck and back well-supported.  Place a pillow or a rolled-up blanket under your baby to bring him or her to the level of your breast (if you are seated). Nursing pillows are specially designed to help support your arms and your baby while you breastfeed.  Make sure that your baby's tummy (abdomen) is facing your abdomen.  Gently massage your breast. With your fingertips, massage from the outer edges of your breast inward toward the nipple. This encourages milk flow. If your milk flows slowly, you may need to continue this action during the feeding.  Support your breast with 4 fingers underneath and your thumb above your nipple (make the letter "C" with your hand). Make sure your fingers are well away from your nipple and your baby's mouth.  Stroke your baby's lips gently with your finger or nipple.  When your baby's mouth is open wide enough, quickly bring your baby to your breast, placing your entire nipple and as much of the areola as possible into your baby's mouth. The areola is the colored area around your nipple. ? More areola should be visible above your  baby's upper lip than below the lower lip. ? Your baby's lips should be opened and extended outward (flanged) to ensure an adequate, comfortable latch. ? Your baby's tongue should be between his or her lower gum and your breast.  Make sure that your baby's mouth is correctly positioned around your nipple (latched). Your baby's lips should create a seal on your breast and be turned out (everted).  It is common for your baby to suck about 2-3 minutes in order to start the flow of breast milk. Latching Teaching your baby how to latch onto your breast properly is very important. An improper latch can cause nipple pain, decreased milk supply, and poor weight gain in your baby. Also, if your baby is not latched onto your nipple properly, he or she may swallow some air during feeding. This can make your baby fussy. Burping your baby when you switch breasts during the feeding can help to get rid of the air. However, teaching your baby to latch on properly is still the best way to prevent fussiness from swallowing air while breastfeeding. Signs that your baby has successfully latched onto   your nipple  Silent tugging or silent sucking, without causing you pain. Infant's lips should be extended outward (flanged).  Swallowing heard between every 3-4 sucks once your milk has started to flow (after your let-down milk reflex occurs).  Muscle movement above and in front of his or her ears while sucking. Signs that your baby has not successfully latched onto your nipple  Sucking sounds or smacking sounds from your baby while breastfeeding.  Nipple pain. If you think your baby has not latched on correctly, slip your finger into the corner of your baby's mouth to break the suction and place it between your baby's gums. Attempt to start breastfeeding again. Signs of successful breastfeeding Signs from your baby  Your baby will gradually decrease the number of sucks or will completely stop sucking.  Your baby  will fall asleep.  Your baby's body will relax.  Your baby will retain a small amount of milk in his or her mouth.  Your baby will let go of your breast by himself or herself. Signs from you  Breasts that have increased in firmness, weight, and size 1-3 hours after feeding.  Breasts that are softer immediately after breastfeeding.  Increased milk volume, as well as a change in milk consistency and color by the fifth day of breastfeeding.  Nipples that are not sore, cracked, or bleeding. Signs that your baby is getting enough milk  Wetting at least 1-2 diapers during the first 24 hours after birth.  Wetting at least 5-6 diapers every 24 hours for the first week after birth. The urine should be clear or pale yellow by the age of 5 days.  Wetting 6-8 diapers every 24 hours as your baby continues to grow and develop.  At least 3 stools in a 24-hour period by the age of 5 days. The stool should be soft and yellow.  At least 3 stools in a 24-hour period by the age of 7 days. The stool should be seedy and yellow.  No loss of weight greater than 10% of birth weight during the first 3 days of life.  Average weight gain of 4-7 oz (113-198 g) per week after the age of 4 days.  Consistent daily weight gain by the age of 5 days, without weight loss after the age of 2 weeks. After a feeding, your baby may spit up a small amount of milk. This is normal. Breastfeeding frequency and duration Frequent feeding will help you make more milk and can prevent sore nipples and extremely full breasts (breast engorgement). Breastfeed when you feel the need to reduce the fullness of your breasts or when your baby shows signs of hunger. This is called "breastfeeding on demand." Signs that your baby is hungry include:  Increased alertness, activity, or restlessness.  Movement of the head from side to side.  Opening of the mouth when the corner of the mouth or cheek is stroked (rooting).  Increased  sucking sounds, smacking lips, cooing, sighing, or squeaking.  Hand-to-mouth movements and sucking on fingers or hands.  Fussing or crying. Avoid introducing a pacifier to your baby in the first 4-6 weeks after your baby is born. After this time, you may choose to use a pacifier. Research has shown that pacifier use during the first year of a baby's life decreases the risk of sudden infant death syndrome (SIDS). Allow your baby to feed on each breast as long as he or she wants. When your baby unlatches or falls asleep while feeding from the   first breast, offer the second breast. Because newborns are often sleepy in the first few weeks of life, you may need to awaken your baby to get him or her to feed. Breastfeeding times will vary from baby to baby. However, the following rules can serve as a guide to help you make sure that your baby is properly fed:  Newborns (babies 4 weeks of age or younger) may breastfeed every 1-3 hours.  Newborns should not go without breastfeeding for longer than 3 hours during the day or 5 hours during the night.  You should breastfeed your baby a minimum of 8 times in a 24-hour period. Breast milk pumping Pumping and storing breast milk allows you to make sure that your baby is exclusively fed your breast milk, even at times when you are unable to breastfeed. This is especially important if you go back to work while you are still breastfeeding, or if you are not able to be present during feedings. Your lactation consultant can help you find a method of pumping that works best for you and give you guidelines about how long it is safe to store breast milk.      Caring for your breasts while you breastfeed Nipples can become dry, cracked, and sore while breastfeeding. The following recommendations can help keep your breasts moisturized and healthy:  Avoid using soap on your nipples.  Wear a supportive bra designed especially for nursing. Avoid wearing underwire-style  bras or extremely tight bras (sports bras).  Air-dry your nipples for 3-4 minutes after each feeding.  Use only cotton bra pads to absorb leaked breast milk. Leaking of breast milk between feedings is normal.  Use lanolin on your nipples after breastfeeding. Lanolin helps to maintain your skin's normal moisture barrier. Pure lanolin is not harmful (not toxic) to your baby. You may also hand express a few drops of breast milk and gently massage that milk into your nipples and allow the milk to air-dry. In the first few weeks after giving birth, some women experience breast engorgement. Engorgement can make your breasts feel heavy, warm, and tender to the touch. Engorgement peaks within 3-5 days after you give birth. The following recommendations can help to ease engorgement:  Completely empty your breasts while breastfeeding or pumping. You may want to start by applying warm, moist heat (in the shower or with warm, water-soaked hand towels) just before feeding or pumping. This increases circulation and helps the milk flow. If your baby does not completely empty your breasts while breastfeeding, pump any extra milk after he or she is finished.  Apply ice packs to your breasts immediately after breastfeeding or pumping, unless this is too uncomfortable for you. To do this: ? Put ice in a plastic bag. ? Place a towel between your skin and the bag. ? Leave the ice on for 20 minutes, 2-3 times a day.  Make sure that your baby is latched on and positioned properly while breastfeeding. If engorgement persists after 48 hours of following these recommendations, contact your health care provider or a lactation consultant. Overall health care recommendations while breastfeeding  Eat 3 healthy meals and 3 snacks every day. Well-nourished mothers who are breastfeeding need an additional 450-500 calories a day. You can meet this requirement by increasing the amount of a balanced diet that you eat.  Drink  enough water to keep your urine pale yellow or clear.  Rest often, relax, and continue to take your prenatal vitamins to prevent fatigue, stress, and low   vitamin and mineral levels in your body (nutrient deficiencies).  Do not use any products that contain nicotine or tobacco, such as cigarettes and e-cigarettes. Your baby may be harmed by chemicals from cigarettes that pass into breast milk and exposure to secondhand smoke. If you need help quitting, ask your health care provider.  Avoid alcohol.  Do not use illegal drugs or marijuana.  Talk with your health care provider before taking any medicines. These include over-the-counter and prescription medicines as well as vitamins and herbal supplements. Some medicines that may be harmful to your baby can pass through breast milk.  It is possible to become pregnant while breastfeeding. If birth control is desired, ask your health care provider about options that will be safe while breastfeeding your baby. Where to find more information: [employer]: www.llli.org Contact a health care provider if:  You feel like you want to stop breastfeeding or have become frustrated with breastfeeding.  Your nipples are cracked or bleeding.  Your breasts are red, tender, or warm.  You have: ? Painful breasts or nipples. ? A swollen area on either breast. ? A fever or chills. ? Nausea or vomiting. ? Drainage other than breast milk from your nipples.  Your breasts do not become full before feedings by the fifth day after you give birth.  You feel sad and depressed.  Your baby is: ? Too sleepy to eat well. ? Having trouble sleeping. ? More than 1 week old and wetting fewer than 6 diapers in a 24-hour period. ? Not gaining weight by 5 days of age.  Your baby has fewer than 3 stools in a 24-hour period.  Your baby's skin or the white parts of his or her eyes become yellow. Get help right away if:  Your baby is overly tired  (lethargic) and does not want to wake up and feed.  Your baby develops an unexplained fever. Summary  Breastfeeding offers many health benefits for infant and mothers.  Try to breastfeed your infant when he or she shows early signs of hunger.  Gently tickle or stroke your baby's lips with your finger or nipple to allow the baby to open his or her mouth. Bring the baby to your breast. Make sure that much of the areola is in your baby's mouth. Offer one side and burp the baby before you offer the other side.  Talk with your health care provider or lactation consultant if you have questions or you face problems as you breastfeed. This information is not intended to replace advice given to you by your health care provider. Make sure you discuss any questions you have with your health care provider. Document Revised: 02/13/2018 Document Reviewed: 12/21/2016 Elsevier Patient Education  2021 Elsevier Inc.  

## 2021-04-29 ENCOUNTER — Other Ambulatory Visit: Payer: Self-pay | Admitting: Advanced Practice Midwife

## 2021-05-03 ENCOUNTER — Ambulatory Visit: Payer: Medicaid Other | Admitting: *Deleted

## 2021-05-03 ENCOUNTER — Ambulatory Visit (INDEPENDENT_AMBULATORY_CARE_PROVIDER_SITE_OTHER): Payer: Medicaid Other

## 2021-05-03 ENCOUNTER — Other Ambulatory Visit: Payer: Self-pay

## 2021-05-03 ENCOUNTER — Encounter (HOSPITAL_COMMUNITY): Payer: Self-pay | Admitting: Family Medicine

## 2021-05-03 ENCOUNTER — Ambulatory Visit (INDEPENDENT_AMBULATORY_CARE_PROVIDER_SITE_OTHER): Payer: Medicaid Other | Admitting: Family Medicine

## 2021-05-03 ENCOUNTER — Inpatient Hospital Stay (HOSPITAL_COMMUNITY)
Admission: AD | Admit: 2021-05-03 | Discharge: 2021-05-06 | DRG: 806 | Disposition: A | Discharge status: Home / Self Care | Payer: Medicaid Other | Attending: Obstetrics & Gynecology | Admitting: Obstetrics & Gynecology

## 2021-05-03 ENCOUNTER — Telehealth (HOSPITAL_COMMUNITY): Payer: Self-pay | Admitting: *Deleted

## 2021-05-03 DIAGNOSIS — O99820 Streptococcus B carrier state complicating pregnancy: Secondary | ICD-10-CM

## 2021-05-03 DIAGNOSIS — F1120 Opioid dependence, uncomplicated: Secondary | ICD-10-CM

## 2021-05-03 DIAGNOSIS — O285 Abnormal chromosomal and genetic finding on antenatal screening of mother: Secondary | ICD-10-CM | POA: Diagnosis present

## 2021-05-03 DIAGNOSIS — Z3A40 40 weeks gestation of pregnancy: Secondary | ICD-10-CM | POA: Diagnosis not present

## 2021-05-03 DIAGNOSIS — F1410 Cocaine abuse, uncomplicated: Secondary | ICD-10-CM | POA: Diagnosis not present

## 2021-05-03 DIAGNOSIS — F17210 Nicotine dependence, cigarettes, uncomplicated: Secondary | ICD-10-CM | POA: Diagnosis present

## 2021-05-03 DIAGNOSIS — O4103X0 Oligohydramnios, third trimester, not applicable or unspecified: Secondary | ICD-10-CM

## 2021-05-03 DIAGNOSIS — Z3A39 39 weeks gestation of pregnancy: Secondary | ICD-10-CM

## 2021-05-03 DIAGNOSIS — O99214 Obesity complicating childbirth: Secondary | ICD-10-CM | POA: Diagnosis present

## 2021-05-03 DIAGNOSIS — O99824 Streptococcus B carrier state complicating childbirth: Secondary | ICD-10-CM | POA: Diagnosis present

## 2021-05-03 DIAGNOSIS — F1420 Cocaine dependence, uncomplicated: Secondary | ICD-10-CM | POA: Diagnosis present

## 2021-05-03 DIAGNOSIS — Z23 Encounter for immunization: Secondary | ICD-10-CM

## 2021-05-03 DIAGNOSIS — O2341 Unspecified infection of urinary tract in pregnancy, first trimester: Secondary | ICD-10-CM

## 2021-05-03 DIAGNOSIS — F419 Anxiety disorder, unspecified: Secondary | ICD-10-CM | POA: Diagnosis present

## 2021-05-03 DIAGNOSIS — O0990 Supervision of high risk pregnancy, unspecified, unspecified trimester: Secondary | ICD-10-CM

## 2021-05-03 DIAGNOSIS — O99320 Drug use complicating pregnancy, unspecified trimester: Secondary | ICD-10-CM | POA: Diagnosis not present

## 2021-05-03 DIAGNOSIS — F1290 Cannabis use, unspecified, uncomplicated: Secondary | ICD-10-CM

## 2021-05-03 DIAGNOSIS — O99334 Smoking (tobacco) complicating childbirth: Secondary | ICD-10-CM | POA: Diagnosis present

## 2021-05-03 DIAGNOSIS — Z20822 Contact with and (suspected) exposure to covid-19: Secondary | ICD-10-CM | POA: Diagnosis present

## 2021-05-03 DIAGNOSIS — O09899 Supervision of other high risk pregnancies, unspecified trimester: Secondary | ICD-10-CM

## 2021-05-03 DIAGNOSIS — Z2839 Other underimmunization status: Secondary | ICD-10-CM

## 2021-05-03 DIAGNOSIS — O99324 Drug use complicating childbirth: Secondary | ICD-10-CM | POA: Diagnosis present

## 2021-05-03 DIAGNOSIS — F17200 Nicotine dependence, unspecified, uncomplicated: Secondary | ICD-10-CM | POA: Diagnosis present

## 2021-05-03 DIAGNOSIS — O480 Post-term pregnancy: Secondary | ICD-10-CM | POA: Diagnosis present

## 2021-05-03 DIAGNOSIS — D563 Thalassemia minor: Secondary | ICD-10-CM | POA: Diagnosis present

## 2021-05-03 DIAGNOSIS — O99321 Drug use complicating pregnancy, first trimester: Secondary | ICD-10-CM

## 2021-05-03 DIAGNOSIS — O4100X0 Oligohydramnios, unspecified trimester, not applicable or unspecified: Secondary | ICD-10-CM | POA: Diagnosis present

## 2021-05-03 LAB — TYPE AND SCREEN
ABO/RH(D): A POS
Antibody Screen: NEGATIVE

## 2021-05-03 LAB — CBC
HCT: 34.1 % — ABNORMAL LOW (ref 36.0–46.0)
Hemoglobin: 10.9 g/dL — ABNORMAL LOW (ref 12.0–15.0)
MCH: 27.7 pg (ref 26.0–34.0)
MCHC: 32 g/dL (ref 30.0–36.0)
MCV: 86.5 fL (ref 80.0–100.0)
Platelets: 185 10*3/uL (ref 150–400)
RBC: 3.94 MIL/uL (ref 3.87–5.11)
RDW: 14.4 % (ref 11.5–15.5)
WBC: 12.8 10*3/uL — ABNORMAL HIGH (ref 4.0–10.5)
nRBC: 0 % (ref 0.0–0.2)

## 2021-05-03 LAB — RAPID URINE DRUG SCREEN, HOSP PERFORMED
Amphetamines: NOT DETECTED
Barbiturates: NOT DETECTED
Benzodiazepines: NOT DETECTED
Cocaine: POSITIVE — AB
Opiates: NOT DETECTED
Tetrahydrocannabinol: POSITIVE — AB

## 2021-05-03 LAB — RESP PANEL BY RT-PCR (FLU A&B, COVID) ARPGX2
Influenza A by PCR: NEGATIVE
Influenza B by PCR: NEGATIVE
SARS Coronavirus 2 by RT PCR: NEGATIVE

## 2021-05-03 MED ORDER — TERBUTALINE SULFATE 1 MG/ML IJ SOLN: 0.2500 mg | Freq: Once | INTRAMUSCULAR | Status: DC | PRN

## 2021-05-03 MED ORDER — SOD CITRATE-CITRIC ACID 500-334 MG/5ML PO SOLN
30.0000 mL | ORAL | Status: DC | PRN
  Filled 2021-05-03: qty 15

## 2021-05-03 MED ORDER — LIDOCAINE HCL (PF) 1 % IJ SOLN
30.0000 mL | INTRAMUSCULAR | Status: AC | PRN
  Administered 2021-05-04: 30 mL via SUBCUTANEOUS
  Filled 2021-05-03: qty 30

## 2021-05-03 MED ORDER — LACTATED RINGERS IV SOLN: 500.0000 mL | INTRAVENOUS | Status: DC | PRN

## 2021-05-03 MED ORDER — OXYTOCIN-SODIUM CHLORIDE 30-0.9 UT/500ML-% IV SOLN
2.5000 [IU]/h | INTRAVENOUS | Status: DC
  Filled 2021-05-03: qty 500

## 2021-05-03 MED ORDER — HYDROXYZINE HCL 25 MG PO TABS: 50.0000 mg | ORAL_TABLET | Freq: Four times a day (QID) | ORAL | Status: DC | PRN

## 2021-05-03 MED ORDER — SODIUM CHLORIDE 0.9 % IV SOLN
5.0000 10*6.[IU] | Freq: Once | INTRAVENOUS | Status: AC
  Administered 2021-05-03: 5 10*6.[IU] via INTRAVENOUS
  Filled 2021-05-03: qty 5

## 2021-05-03 MED ORDER — BUPRENORPHINE HCL-NALOXONE HCL 8-2 MG SL FILM: 8.0000 mg | ORAL_FILM | Freq: Two times a day (BID) | SUBLINGUAL | Status: DC

## 2021-05-03 MED ORDER — ONDANSETRON HCL 4 MG/2ML IJ SOLN
4.0000 mg | Freq: Four times a day (QID) | INTRAMUSCULAR | Status: DC | PRN
  Administered 2021-05-03: 4 mg via INTRAVENOUS
  Filled 2021-05-03: qty 2

## 2021-05-03 MED ORDER — LACTATED RINGERS IV SOLN: INTRAVENOUS | Status: DC

## 2021-05-03 MED ORDER — FENTANYL CITRATE (PF) 100 MCG/2ML IJ SOLN
50.0000 ug | INTRAMUSCULAR | Status: DC | PRN
  Administered 2021-05-04: 50 ug via INTRAVENOUS
  Filled 2021-05-03: qty 2

## 2021-05-03 MED ORDER — BUPRENORPHINE HCL-NALOXONE HCL 8-2 MG SL SUBL
1.0000 | SUBLINGUAL_TABLET | Freq: Two times a day (BID) | SUBLINGUAL | Status: DC
  Administered 2021-05-03 – 2021-05-06 (×6): 1 via SUBLINGUAL
  Filled 2021-05-03 (×6): qty 1

## 2021-05-03 MED ORDER — ACETAMINOPHEN 325 MG PO TABS: 650.0000 mg | ORAL_TABLET | ORAL | Status: DC | PRN

## 2021-05-03 MED ORDER — MISOPROSTOL 50MCG HALF TABLET
50.0000 ug | ORAL_TABLET | ORAL | Status: DC | PRN
  Administered 2021-05-03 – 2021-05-04 (×2): 50 ug via ORAL
  Filled 2021-05-03 (×2): qty 1

## 2021-05-03 MED ORDER — OXYTOCIN BOLUS FROM INFUSION
333.0000 mL | Freq: Once | INTRAVENOUS | Status: AC
  Administered 2021-05-04: 333 mL via INTRAVENOUS

## 2021-05-03 MED ORDER — PENICILLIN G POT IN DEXTROSE 60000 UNIT/ML IV SOLN
3.0000 10*6.[IU] | INTRAVENOUS | Status: DC
  Administered 2021-05-04 (×4): 3 10*6.[IU] via INTRAVENOUS
  Filled 2021-05-03 (×4): qty 50

## 2021-05-03 NOTE — Progress Notes (Signed)
   PRENATAL VISIT NOTE  Subjective:  Monica Vazquez is a 25 y.o. G2P0010 at [redacted]w[redacted]d being seen today for ongoing prenatal care.  She is currently monitored for the following issues for this high-risk pregnancy and has Supervision of high risk pregnancy, antepartum; Suboxone maintenance treatment complicating pregnancy, antepartum (HCC); Smoker; Anxiety; Maternal UTI (urinary tract infection), first trimester; Rubella non-immune status, antepartum; Cocaine abuse affecting pregnancy in first trimester (HCC); Marijuana use; Abn chromsoml and genetic find on antenat screen of mother; and GBS (group B Streptococcus carrier), +RV culture, currently pregnant on their problem list.  Patient reports no complaints.   .  .   . Denies leaking of fluid.   The following portions of the patient's history were reviewed and updated as appropriate: allergies, current medications, past family history, past medical history, past social history, past surgical history and problem list.   Objective:  There were no vitals filed for this visit.  Fetal Status:           General:  Alert, oriented and cooperative. Patient is in no acute distress.  Skin: Skin is warm and dry. No rash noted.   Cardiovascular: Normal heart rate noted  Respiratory: Normal respiratory effort, no problems with respiration noted  Abdomen: Soft, gravid, appropriate for gestational age.        Pelvic: Cervical exam deferred        Extremities: Normal range of motion.     Mental Status: Normal mood and affect. Normal behavior. Normal judgment and thought content.   Assessment and Plan:  Pregnancy: G2P0010 at [redacted]w[redacted]d 1. Supervision of high risk pregnancy, antepartum FHT and FH normal  2. Oligohydramnios in third trimester, single or unspecified fetus BPP 8/10 NST reactive, but new onset oligohydramnios. Will induce - patient to get bags and will go to labor floor. Discussed with Labor Floor Charge nurse and Dr Ashok Pall  3. Suboxone maintenance  treatment complicating pregnancy, antepartum (HCC) Stable.  Preterm labor symptoms and general obstetric precautions including but not limited to vaginal bleeding, contractions, leaking of fluid and fetal movement were reviewed in detail with the patient. Please refer to After Visit Summary for other counseling recommendations.   No follow-ups on file.  Future Appointments  Date Time Provider Department Center  05/08/2021 12:00 AM [facility] SCHED ROOM [facility] None    Levie Heritage, DO

## 2021-05-03 NOTE — Telephone Encounter (Signed)
Preadmission screen  

## 2021-05-03 NOTE — H&P (Addendum)
LABOR AND DELIVERY ADMISSION HISTORY AND PHYSICAL NOTE  Monica Vazquez is a 25 y.o. female G2P0010 with IUP at 58w6dby initial [place] for IOL due to new onset oligohydramnios.  She reports positive fetal movement. She denies leakage of fluid, vaginal bleeding, or contractions. Had an ultrasound today that showed her amniotic fluid was low and was told to come to L&D.  She plans on breast feeding. Her contraception plan is: TBD, thinking Depo.  [facility]Com[facility]  [facility] , CWD, normal anatomy, cephalic presentation, anterior placenta, 12%ile, EFW 2415             _1 , AFI 40.3TW(<<6%, cephalic, BPP 85/68 Pregnancy complications:  - Smoker (vaping) - Suboxone use during pregnancy - Rubella NI - GBS+  Past Medical History: Past Medical History:  Diagnosis Date  . ADHD (attention deficit hyperactivity disorder)   . 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 Claustrophobia     Past Surgical History: Past Surgical History:  Procedure Laterality Date  . implanon    . TYMPANOMASTOIDECTOMY Right 09/12/2015   Procedure: RIGHT MODIFIED RADICAL TYMPANOMASTOIDECTOMY;  Surgeon: SLeta Baptist MD;  [place] [facility]  Service: ENT;  Laterality: Right;  . WISDOM TOOTH EXTRACTION      Obstetrical History: OB History    Gravida  2   Para  0   Term  0   Preterm  0   AB  1   Living  0     SAB  1   IAB  0   Ectopic  0   Multiple  0   Live Births  0           Social History: Social History   Socioeconomic History  . Marital status: Single    Spouse name: Not on file  . Number of children: Not on file  . Years of education: Not on file  . Highest education level: Not on file  Occupational History  . Not on file  Tobacco Use  . Smoking status: Current Every Day Smoker    Packs/day: 0.00    Years: 3.00    Pack years: 0.00    Types: Cigarettes  . Smokeless tobacco: Never Used  . Tobacco comment: smokes 3-4 cig  daily  Vaping Use  . Vaping Use: Former  . Substances: Nicotine  Substance and Sexual Activity  . Alcohol use: No  . Drug use: Yes    Types: Oxycodone, Cocaine, Marijuana    Comment: suboxone, THC a month ago, cocaine early pregnancy  . Sexual activity: Yes    Birth control/protection: None  Other Topics Concern  . Not on file  Social History Narrative   Both parents are incarcerated; maternal grandmother is legal guardian - to bring documentation of guardianship DOS.  States pt. lives with boyfriend; grandmother advised that pt. will need to go home to grandmother's house DOS.   Social Determinants of Health   Financial Resource Strain: Low Risk   . Difficulty of Paying Living Expenses: Not hard at all  Food Insecurity: No Food Insecurity  . Worried About RCharity fundraiserin the Last Year: Never true  . Ran Out of Food in the Last Year: Never true  Transportation Needs: No Transportation Needs  . Lack of Transportation (Medical): No  . Lack of Transportation (Non-Medical): No  Physical Activity: Inactive  . Days of Exercise per Week: 0 days  . Minutes of Exercise per Session: 0 min  Stress: No Stress Concern Present  . Feeling of Stress : Only a little  Social Connections: Socially Isolated  . Frequency of Communication with Friends and Family: More than three times a week  . Frequency of Social Gatherings with Friends and Family: Once a week  . Attends Religious Services: Never  . Active Member of Clubs or Organizations: No  . Attends Archivist Meetings: Never  . Marital Status: Never married    Family History: Family History  Problem Relation Age of Onset  . Hypertension Father   . Heart disease Father     Allergies: Allergies  Allergen Reactions  . Hydrocodone Itching    SKIN BURNING    Medications Prior to Admission  Medication Sig Dispense Refill Last Dose  . Buprenorphine HCl-Naloxone HCl (SUBOXONE) 8-2 MG FILM Place 1 Film under the tongue  in the morning and at bedtime for 15 days. 30 each 0 05/03/2021 at Unknown time  . famotidine (PEPCID) 20 MG tablet Take 1 tablet (20 mg total) by mouth daily. 30 tablet 1 05/03/2021 at Unknown time  . ondansetron (ZOFRAN ODT) 4 MG disintegrating tablet Take 1 tablet (4 mg total) by mouth every 8 (eight) hours as needed for nausea or vomiting. 60 tablet 2 05/03/2021 at Unknown time  . Prenatal Vit-Fe Fumarate-FA (PRENATAL VITAMINS) 28-0.8 MG TABS Take 1 tablet by mouth daily. 30 tablet 11 05/03/2021 at Unknown time  . Blood Pressure Monitor MISC For regular home bp monitoring during pregnancy (Patient not taking: Reported on 05/03/2021) 1 each 0   . naloxone (NARCAN) nasal spray 4 mg/0.1 mL Use in case of overdose (Patient not taking: Reported on 05/03/2021) 1 each 1      Review of Systems  All systems reviewed and negative except as stated in HPI  Physical Exam Blood pressure 131/72, pulse 73, temperature 98.2 F (36.8 C), temperature source Oral, resp. rate 16, height _0  (1.702 m), weight 92.1 kg, last menstrual period 07/17/2020. General appearance: alert, oriented, NAD Lungs: normal respiratory effort Heart: regular rate Abdomen: soft, non-tender; gravid Extremities: No calf swelling or tenderness Presentation: cephalic by leopolds  Fetal monitoring: Baseline: 150 bpm, Variability: Good {> 6 bpm), Accelerations: Reactive and Decelerations: Absent Uterine activity: Frequency: Every 5-6 minutes, just started after receiving first dose of cytotec  Dilation: 1 Effacement (%): 10 Station: -3 Exam by:: Mary Martinique Johnson, RN  Prenatal labs: ABO, Rh: --/--/A POS (06/01 2037) Antibody: NEG (06/01 2037) Rubella: <0.90 (11/19 1404) RPR: Non Reactive (03/08 0834)  HBsAg: Negative (11/19 1404)  HIV: Non Reactive (03/29 1646)  GC/Chlamydia: negative GBS: Positive/-- (05/06 1141)  2-hr GTT: normal Genetic screening:  normal Anatomy US: normal  Prenatal Transfer Tool  Maternal Diabetes:  No Genetic Screening: Normal Maternal Ultrasounds/Referrals: Other:oligohydramnios Fetal Ultrasounds or other Referrals:  None Maternal Substance Abuse:  Yes:  Type: Smoker (vape), suboxone Significant Maternal Medications: Suboxone, Pepcid, Zofran Significant Maternal Lab Results: Group B Strep positive, Rubella non-immune  Results for orders placed or performed during the hospital encounter of 05/03/21 (from the past 24 hour(s))  CBC   Collection Time: 05/03/21  8:08 PM  Result Value Ref Range   WBC 12.8 (H) 4.0 - 10.5 K/uL   RBC 3.94 3.87 - 5.11 MIL/uL   Hemoglobin 10.9 (L) 12.0 - 15.0 g/dL   HCT 34.1 (L) 36.0 - 46.0 %   MCV 86.5 80.0 - 100.0 fL   MCH 27.7 26.0 - 34.0 pg   MCHC 32.0 30.0 - 36.0 g/dL  RDW 14.4 11.5 - 15.5 %   Platelets 185 150 - 400 K/uL   nRBC 0.0 0.0 - 0.2 %  Urine rapid drug screen (hosp performed)not at [facility]   Collection Time: 05/03/21  8:08 PM  Result Value Ref Range   Opiates NONE DETECTED NONE DETECTED   Cocaine POSITIVE (A) NONE DETECTED   Benzodiazepines NONE DETECTED NONE DETECTED   Amphetamines NONE DETECTED NONE DETECTED   Tetrahydrocannabinol POSITIVE (A) NONE DETECTED   Barbiturates NONE DETECTED NONE DETECTED  Type and screen   Collection Time: 05/03/21  8:37 PM  Result Value Ref Range   ABO/RH(D) A POS    Antibody Screen NEG    Sample Expiration      05/06/2021,2359 Performed at [facility], [place] [address] [place], [place] [address]   Resp Panel by RT-PCR (Flu A&B, Covid) Nasopharyngeal Swab   Collection Time: 05/03/21  8:49 PM   Specimen: Nasopharyngeal Swab; Nasopharyngeal(NP) swabs in vial transport medium  Result Value Ref Range   SARS Coronavirus 2 by RT PCR NEGATIVE NEGATIVE   Influenza A by PCR NEGATIVE NEGATIVE   Influenza B by PCR NEGATIVE NEGATIVE    Patient Active Problem List   Diagnosis Date Noted  . Oligohydramnios 05/03/2021  . GBS (group B Streptococcus carrier), +RV culture, currently pregnant 04/11/2021   . Abn chromsoml and genetic find on antenat screen of mother 11/09/2020  . Cocaine abuse affecting pregnancy in first trimester ([place]) 10/29/2020  . Marijuana use 10/29/2020  . Rubella non-immune status, antepartum 10/22/2020  . Smoker 10/21/2020  . Anxiety 10/21/2020  . Maternal UTI (urinary tract infection), first trimester 10/21/2020  . Suboxone maintenance treatment complicating pregnancy, antepartum ([place]) 10/20/2020  . Supervision of high risk pregnancy, antepartum 10/18/2020    Assessment: KATARA GRINER is a 25 y.o. G2P0010 at 66w6dhere for IOL 2/2 new onset oligohydramnios  #Labor: Latent. Will induce for oligohydramnios (AFI 4.8 on 05/03/21). Cytotec started @ 2046. Will check again in 4 hours #OUD: Continue home suboxone dosing 857mtab bid #Rubella NI: MMR after delivery #Fetal Wellbeing:  Category I #Pain Control: IV pain meds for now, pt would like Epidural #GBS/ID: Positive, PCN given #COVID: swab pending negative #MOF: Breast #MOC: TBD, considering Depo shot #Circ: Yes, inpatient #Anticipated MOD: NSVD   WiAudree BaneMD, PGY-1 Family Medicine Resident, [facility]  05/03/2021, 10:02 PM   CNM attestation:  I have seen and examined this patient; I agree with above documentation in the resident's note.   Monica ZOBRISTs a [age].o. G2P0010 here for IOL due to oligohydramnios dx today with AFI 4.8cm (<3rd%).  PE: BP 131/72   Pulse 73   Temp 98.2 F (36.8 C) (Oral)   Resp 16   Ht _0  (1.702 m)   Wt 92.1 kg   LMP 07/17/2020 (Within Weeks)   BMI 31.81 kg/m  Gen: calm comfortable, NAD Resp: normal effort, no distress Abd: gravid  ROS, labs, PMH reviewed  Plan: Admit to Labor and Delivery Plan cervical ripening with cytotec to start; add in cervical foley when able; Pit/AROM prn UDS: +cocaine on admit> plan SW eval PP PCN for GBS ppx Anticipate vag del Rec MMR vax PP  KiMyrtis SerNM 05/03/2021, 11:22 PM

## 2021-05-04 ENCOUNTER — Encounter (HOSPITAL_COMMUNITY): Payer: Self-pay

## 2021-05-04 ENCOUNTER — Inpatient Hospital Stay (HOSPITAL_COMMUNITY): Payer: Medicaid Other | Admitting: Anesthesiology

## 2021-05-04 DIAGNOSIS — F1410 Cocaine abuse, uncomplicated: Secondary | ICD-10-CM

## 2021-05-04 DIAGNOSIS — O99324 Drug use complicating childbirth: Secondary | ICD-10-CM

## 2021-05-04 DIAGNOSIS — O99820 Streptococcus B carrier state complicating pregnancy: Secondary | ICD-10-CM

## 2021-05-04 DIAGNOSIS — O4103X0 Oligohydramnios, third trimester, not applicable or unspecified: Secondary | ICD-10-CM

## 2021-05-04 DIAGNOSIS — Z3A40 40 weeks gestation of pregnancy: Secondary | ICD-10-CM

## 2021-05-04 LAB — RPR: RPR Ser Ql: NONREACTIVE

## 2021-05-04 MED ORDER — MAGNESIUM HYDROXIDE 400 MG/5ML PO SUSP: 30.0000 mL | ORAL | Status: DC | PRN

## 2021-05-04 MED ORDER — COCONUT OIL OIL
1.0000 | TOPICAL_OIL | Status: DC | PRN
Start: 2021-05-04 — End: 2021-05-06

## 2021-05-04 MED ORDER — EPHEDRINE 5 MG/ML INJ: 10.0000 mg | INTRAVENOUS | Status: DC | PRN

## 2021-05-04 MED ORDER — DIPHENHYDRAMINE HCL 50 MG/ML IJ SOLN: 12.5000 mg | INTRAMUSCULAR | Status: DC | PRN

## 2021-05-04 MED ORDER — SENNOSIDES-DOCUSATE SODIUM 8.6-50 MG PO TABS
2.0000 | ORAL_TABLET | ORAL | Status: DC
  Administered 2021-05-05 – 2021-05-06 (×2): 2 via ORAL
  Filled 2021-05-04 (×2): qty 2

## 2021-05-04 MED ORDER — PHENYLEPHRINE 40 MCG/ML (10ML) SYRINGE FOR IV PUSH (FOR BLOOD PRESSURE SUPPORT): 80.0000 ug | PREFILLED_SYRINGE | INTRAVENOUS | Status: DC | PRN

## 2021-05-04 MED ORDER — LIDOCAINE HCL (PF) 1 % IJ SOLN
INTRAMUSCULAR | Status: DC | PRN
  Administered 2021-05-04: 8 mL via EPIDURAL

## 2021-05-04 MED ORDER — BENZOCAINE-MENTHOL 20-0.5 % EX AERO
1.0000 | INHALATION_SPRAY | CUTANEOUS | Status: DC | PRN
Start: 2021-05-04 — End: 2021-05-06
  Filled 2021-05-04: qty 56

## 2021-05-04 MED ORDER — ONDANSETRON HCL 4 MG PO TABS: 4.0000 mg | ORAL_TABLET | ORAL | Status: DC | PRN

## 2021-05-04 MED ORDER — SIMETHICONE 80 MG PO CHEW: 80.0000 mg | CHEWABLE_TABLET | ORAL | Status: DC | PRN

## 2021-05-04 MED ORDER — DIPHENHYDRAMINE HCL 25 MG PO CAPS: 25.0000 mg | ORAL_CAPSULE | Freq: Four times a day (QID) | ORAL | Status: DC | PRN

## 2021-05-04 MED ORDER — OXYTOCIN-SODIUM CHLORIDE 30-0.9 UT/500ML-% IV SOLN
1.0000 m[IU]/min | INTRAVENOUS | Status: DC
Start: 2021-05-04 — End: 2021-05-04
  Administered 2021-05-04: 2 m[IU]/min via INTRAVENOUS

## 2021-05-04 MED ORDER — TRANEXAMIC ACID-NACL 1000-0.7 MG/100ML-% IV SOLN
1000.0000 mg | Freq: Once | INTRAVENOUS | Status: DC
  Filled 2021-05-04: qty 100

## 2021-05-04 MED ORDER — FENTANYL-BUPIVACAINE-NACL 0.5-0.125-0.9 MG/250ML-% EP SOLN
EPIDURAL | Status: AC
  Filled 2021-05-04: qty 250

## 2021-05-04 MED ORDER — FENTANYL-BUPIVACAINE-NACL 0.5-0.125-0.9 MG/250ML-% EP SOLN
12.0000 mL/h | EPIDURAL | Status: DC | PRN
  Administered 2021-05-04: 12 mL/h via EPIDURAL

## 2021-05-04 MED ORDER — PHENYLEPHRINE 40 MCG/ML (10ML) SYRINGE FOR IV PUSH (FOR BLOOD PRESSURE SUPPORT)
PREFILLED_SYRINGE | INTRAVENOUS | Status: AC
  Filled 2021-05-04: qty 10

## 2021-05-04 MED ORDER — OXYCODONE HCL 5 MG PO TABS: 10.0000 mg | ORAL_TABLET | ORAL | Status: DC | PRN

## 2021-05-04 MED ORDER — ONDANSETRON HCL 4 MG/2ML IJ SOLN: 4.0000 mg | INTRAMUSCULAR | Status: DC | PRN

## 2021-05-04 MED ORDER — ACETAMINOPHEN 325 MG PO TABS
650.0000 mg | ORAL_TABLET | ORAL | Status: DC | PRN
  Administered 2021-05-05 (×2): 650 mg via ORAL
  Filled 2021-05-04 (×2): qty 2

## 2021-05-04 MED ORDER — DIBUCAINE (PERIANAL) 1 % EX OINT: 1.0000 "application " | TOPICAL_OINTMENT | CUTANEOUS | Status: DC | PRN

## 2021-05-04 MED ORDER — IBUPROFEN 600 MG PO TABS
600.0000 mg | ORAL_TABLET | Freq: Four times a day (QID) | ORAL | Status: DC
  Administered 2021-05-04 – 2021-05-06 (×7): 600 mg via ORAL
  Filled 2021-05-04 (×7): qty 1

## 2021-05-04 MED ORDER — WITCH HAZEL-GLYCERIN EX PADS: 1.0000 "application " | MEDICATED_PAD | CUTANEOUS | Status: DC | PRN

## 2021-05-04 MED ORDER — LACTATED RINGERS IV SOLN: 500.0000 mL | Freq: Once | INTRAVENOUS | Status: DC

## 2021-05-04 MED ORDER — PRENATAL MULTIVITAMIN CH
1.0000 | ORAL_TABLET | Freq: Every day | ORAL | Status: DC
  Administered 2021-05-05 – 2021-05-06 (×2): 1 via ORAL
  Filled 2021-05-04 (×2): qty 1

## 2021-05-04 MED ORDER — OXYCODONE HCL 5 MG PO TABS: 5.0000 mg | ORAL_TABLET | ORAL | Status: DC | PRN

## 2021-05-04 NOTE — Progress Notes (Signed)
Monica Vazquez is a 25 y.o. G2P0010 at [redacted]w[redacted]d admitted for IOL for oligohydramnios.  Subjective: Patient is doing well. She is resting comfortably with epidural. Reporting intermittent pelvic pressure, otherwise no other complaints or concerns at this time.   Objective: BP 120/62   Pulse 67   Temp 98 F (36.7 C) (Oral)   Resp 15   Ht 5\' 7"  (1.702 m)   Wt 92.1 kg   LMP 07/17/2020 (Within Weeks)   SpO2 99%   BMI 31.81 kg/m  No intake/output data recorded. No intake/output data recorded.  FHT: 130bpm, moderate variability, +accels, no decels UC: Q 2-52mins SVE:   Dilation: 4 Effacement (%): 50 Station: -1 Exam by:: Weisgerber, MD  Labs: Lab Results  Component Value Date   WBC 12.8 (H) 05/03/2021   HGB 10.9 (L) 05/03/2021   HCT 34.1 (L) 05/03/2021   MCV 86.5 05/03/2021   PLT 185 05/03/2021    Assessment / Plan: Monica Vazquez 25y.o G2P0 at 40.0wks, IOL for oligohydramnios  Labor: Pitocin currently at 56mu/min. Continue to titrate prn per unit policy. Consider AROM prn OUD: Suboxone 8mg  tablet BID  Fetal Wellbeing:  Category I Pain Control:  Epidural I/D:  GBS positive, PCN Anticipated MOD:  NSVD  9m, MSN, CNM 05/04/2021, 9:02 AM

## 2021-05-04 NOTE — Lactation Note (Signed)
This note was copied from a baby's chart. Lactation Consultation Note  Patient Name: Monica Vazquez Date: 05/04/2021   Age:23 hours   Attempted to visit with mom, but L&D RN Carollee Herter reported to [facility] that mom tested (+) for cocaine and THC (+) on admission. Waiting for provider's orders, RN aware to notify lactation if anything changes.  Maternal Data    Feeding    LATCH Score                    Lactation Tools Discussed/Used    Interventions    Discharge    Consult Status      Monica Vazquez 05/04/2021, 5:58 PM

## 2021-05-04 NOTE — Anesthesia Preprocedure Evaluation (Addendum)
Anesthesia Evaluation  Patient identified by MRN, date of birth, ID band Patient awake    Reviewed: Allergy & Precautions, NPO status , Patient's Chart, lab work & pertinent test results  Airway Mallampati: III  TM Distance: >3 FB Neck ROM: Full    Dental no notable dental hx.    Pulmonary neg pulmonary ROS, Current Smoker,    Pulmonary exam normal breath sounds clear to auscultation       Cardiovascular negative cardio ROS Normal cardiovascular exam Rhythm:Regular Rate:Normal     Neuro/Psych PSYCHIATRIC DISORDERS Anxiety negative neurological ROS     GI/Hepatic negative GI ROS, (+)     substance abuse  cocaine use and marijuana use,   Endo/Other  obesity  Renal/GU negative Renal ROS  negative genitourinary   Musculoskeletal negative musculoskeletal ROS (+) narcotic dependent  Abdominal Normal abdominal exam  (+)   Peds negative pediatric ROS (+)  Hematology negative hematology ROS (+)   Anesthesia Other Findings   Reproductive/Obstetrics negative OB ROS                            Anesthesia Physical Anesthesia Plan  ASA: III  Anesthesia Plan: Epidural   Post-op Pain Management:    Induction:   PONV Risk Score and Plan: 1  Airway Management Planned:   Additional Equipment:   Intra-op Plan:   Post-operative Plan:   Informed Consent: I have reviewed the patients History and Physical, chart, labs and discussed the procedure including the risks, benefits and alternatives for the proposed anesthesia with the patient or authorized representative who has indicated his/her understanding and acceptance.       Plan Discussed with: CRNA and Anesthesiologist  Anesthesia Plan Comments:         Anesthesia Quick Evaluation

## 2021-05-04 NOTE — Progress Notes (Signed)
LABOR PROGRESS NOTE  Monica Vazquez is a 25 y.o. G2P0010 at [redacted]w[redacted]d admitted for IOL due to oligohydramnios  Subjective: Feeling regular contractions every few minutes with low back and suprapubic pain. No LOF  Objective: BP 136/70   Pulse 77   Temp 97.7 F (36.5 C) (Oral)   Resp 16   Ht 5\' 7"  (1.702 m)   Wt 92.1 kg   LMP 07/17/2020 (Within Weeks)   BMI 31.81 kg/m   Dilation: 4 Effacement (%): 50 Station: -1 Presentation: Vertex Exam by:: Will Jayle Solarz, MD Fetal monitoring: Baseline: 155 bpm, Variability: Good {> 6 bpm), Accelerations: Reactive, and Decelerations: Absent Uterine activity: Frequency: Every 3-4 minutes, but not picking up very well for the last 30 minutes  Labs: Lab Results  Component Value Date   WBC 12.8 (H) 05/03/2021   HGB 10.9 (L) 05/03/2021   HCT 34.1 (L) 05/03/2021   MCV 86.5 05/03/2021   PLT 185 05/03/2021    Patient Active Problem List   Diagnosis Date Noted  . Oligohydramnios 05/03/2021  . GBS (group B Streptococcus carrier), +RV culture, currently pregnant 04/11/2021  . Abn chromsoml and genetic find on antenat screen of mother 11/09/2020  . Cocaine abuse affecting pregnancy in first trimester ([facility]) 10/29/2020  . Marijuana use 10/29/2020  . Rubella non-immune status, antepartum 10/22/2020  . Smoker 10/21/2020  . Anxiety 10/21/2020  . Maternal UTI (urinary tract infection), first trimester 10/21/2020  . Suboxone maintenance treatment complicating pregnancy, antepartum ([facility]) 10/20/2020  . Supervision of high risk pregnancy, antepartum 10/18/2020    Assessment / Plan: Induction of labor due to oligohydramnios (AFI 4.8)  #Labor: Progressing normally. Latent. S/p cytotec x2. S/p foley bulb. Bishop score 7, will start Pitocin and recheck in 4 hours #OUD: suboxone 8mg  tablet bid #Fetal Wellbeing:  Category I #Pain Control: IV pain meds per pt request, pt would like epidural #ID: GBS positive, PCN #Anticipated MOD: NSVD   10/20/2020 MD, PGY-1 Family Medicine Resident, [facility] Faculty Teaching Service  05/04/2021, 4:49 AM

## 2021-05-04 NOTE — Progress Notes (Addendum)
POSTPARTUM PROGRESS NOTE  Post Partum Day 1  Subjective:  Monica Vazquez is a 25 y.o. G2P1011 s/p IOL due to oligohydramnios at [redacted]w[redacted]d.  She reports she is doing well. No acute events overnight. She denies any problems with ambulating, voiding or po intake. Passing gas, no BM yet. Denies nausea or vomiting.  Pain is well controlled.  Lochia is minimal.  Objective: Blood pressure 127/75, pulse 74, temperature (!) 97.2 F (36.2 C), temperature source Axillary, resp. rate 16, height 5\' 7"  (1.702 m), weight 92.1 kg, last menstrual period 07/17/2020, SpO2 100 %, unknown if currently breastfeeding.  Physical Exam:  General: alert, cooperative and no distress Chest: no respiratory distress Heart:regular rate, distal pulses intact Abdomen: soft, nontender Uterine Fundus: firm, appropriately tender DVT Evaluation: No calf swelling or tenderness Extremities: no edema Skin: warm, dry  Recent Labs    05/03/21 2008  HGB 10.9*  HCT 34.1*    Assessment/Plan: Monica Vazquez is a 25 y.o. G2P1011 s/p IOL due to oligohydramnios at [redacted]w[redacted]d   PPD#1 - Doing well. Continue routine postpartum care. Needs to be seen by SW d/t +THC and cocaine on UDS upon admission. OUD: suboxone 8mg  tab BID Contraception: Depo Feeding: Bottle Dispo: Plan for discharge - functional G1 and needs to be seen by SW to determine dispo for baby. Will likely discharge 6/4   LOS: 1 day    MD, PGY-1 OBGYN Faculty Teaching Service  05/04/2021, 10:20 PM

## 2021-05-04 NOTE — Progress Notes (Signed)
ELENER CUSTODIO is a 25 y.o. G2P0010 at [redacted]w[redacted]d admitted for IOL for oligohydramnios.  Subjective: Patient doing well. Resting comfortably with epidural. Continues to feel intermittent pelvic pressure.   Objective: BP 128/86   Pulse 74   Temp 97.9 F (36.6 C) (Oral)   Resp 15   Ht 5\' 7"  (1.702 m)   Wt 92.1 kg   LMP 07/17/2020 (Within Weeks)   SpO2 99%   BMI 31.81 kg/m  No intake/output data recorded. No intake/output data recorded.  FHT: 130bpm, moderate variability, +accels, early/intermittent variable decelerations UC: Q 2-56mins SVE:   Dilation: 10 Effacement (%): 100 Station: Plus 1 Exam by:: [email or web address] RNC  Labs: Lab Results  Component Value Date   WBC 12.8 (H) 05/03/2021   HGB 10.9 (L) 05/03/2021   HCT 34.1 (L) 05/03/2021   MCV 86.5 05/03/2021   PLT 185 05/03/2021    Assessment / Plan: Gianina Leeman 25y.o G2P0 at 40.0wks, IOL for oligohydramnios  Labor: Patient complete without urge to push. Will labor down for 1 hour.  OUD: Suboxone 8mg  tablet BID  Fetal Wellbeing:  Category II Pain Control:  Epidural I/D:  GBS positive, PCN, adequately treated Anticipated MOD:  NSVD  07/03/2021, MSN, CNM 05/04/2021, 3:20 PM

## 2021-05-04 NOTE — Progress Notes (Signed)
LABOR PROGRESS NOTE  Monica Vazquez is a 25 y.o. G2P0010 at 73w0dadmitted for IOL due to oligohydramnios  Subjective: She experienced some nausea that improved with Zofran. Feeling some contractions and some low back pain. No pressure. No LOF.  Objective: BP 131/70   Pulse 60   Temp 98.2 F (36.8 C) (Oral)   Resp 16   Ht _0  (1.702 m)   Wt 92.1 kg   LMP 07/17/2020 (Within Weeks)   BMI 31.81 kg/m   Dilation: 1 Effacement (%): 60 Station: -3 Presentation: Vertex Exam by:: Will Cyntha Brickman, MD Fetal monitoring: Baseline: 125 bpm, Variability: Good {> 6 bpm), Accelerations: Reactive, and Decelerations: Absent Uterine activity: Frequency: Every 3-5 minutes, Duration: 60-90 seconds, and Intensity: mild  Labs: Lab Results  Component Value Date   WBC 12.8 (H) 05/03/2021   HGB 10.9 (L) 05/03/2021   HCT 34.1 (L) 05/03/2021   MCV 86.5 05/03/2021   PLT 185 05/03/2021    Patient Active Problem List   Diagnosis Date Noted  . Oligohydramnios 05/03/2021  . GBS (group B Streptococcus carrier), +RV culture, currently pregnant 04/11/2021  . Abn chromsoml and genetic find on antenat screen of mother 11/09/2020  . Cocaine abuse affecting [place] in first [place] 10/29/2020  . Marijuana use 10/29/2020  . Rubella non-immune status, antepartum 10/22/2020  . Smoker 10/21/2020  . Anxiety 10/21/2020  . Maternal UTI (urinary tract infection), first trimester 10/21/2020  . Suboxone maintenance treatment complicating pregnancy, [place] 10/20/2020  . Supervision of high risk pregnancy, antepartum 10/18/2020    Assessment / Plan: Induction of labor due to oligohydramnios (AFI 4.8)  #Labor: Progressing normally. Latent. Will give 2nd dose of Cytotec at 0[place] Placed FB at 0000. Will check again in 4 hours. #OUD: suboxon 835mtablet bid #Rubella NI: MMR after delivery #Fetal Wellbeing:  Category I #Pain Control: IV pain meds per pt request, pt would like epidural #ID:  GBS positive, PCN #Anticipated MOD: NSVD   WiAudree BaneD, PGY-1 Family Medicine Resident, [facility] Teaching Service  05/04/2021, 12:14 AM

## 2021-05-04 NOTE — Progress Notes (Signed)
Monica Vazquez is a 25 y.o. G2P0010 at [redacted]w[redacted]d admitted for IOL for oligohydramnios.  Subjective: Reporting more intermittent pelvic pressure.   Objective: BP 109/60   Pulse 63   Temp 98 F (36.7 C) (Oral)   Resp 15   Ht 5\' 7"  (1.702 m)   Wt 92.1 kg   LMP 07/17/2020 (Within Weeks)   SpO2 99%   BMI 31.81 kg/m  No intake/output data recorded. No intake/output data recorded.  FHT: 120bpm, moderate variability, +accels, ?early vs ?late decels UC: Q 2-72mins SVE:   Dilation: 5.5 Effacement (%): 90 Station: 0 Exam by:: D Bogdan Vivona CNM  Labs: Lab Results  Component Value Date   WBC 12.8 (H) 05/03/2021   HGB 10.9 (L) 05/03/2021   HCT 34.1 (L) 05/03/2021   MCV 86.5 05/03/2021   PLT 185 05/03/2021    Assessment / Plan: [name] 25y.o G2P0 at 40.0wks, IOL for oligohydramnios  Labor: Pitocin currently at 25mu/min. Given unable to trace contractions and fetal decelerations, AROM was performed and IUPC placed.  OUD: Suboxone 8mg  tablet BID  Fetal Wellbeing:  Category II Pain Control:  Epidural I/D:  GBS positive, PCN, adequately treated Anticipated MOD:  NSVD  13m, MSN, CNM 05/04/2021, 11:44 AM

## 2021-05-04 NOTE — Anesthesia Procedure Notes (Signed)
Epidural Patient location during procedure: OB Start time: 05/04/2021 7:35 AM End time: 05/04/2021 7:45 AM  Staffing Anesthesiologist: Mellody Dance, MD Performed: anesthesiologist   Preanesthetic Checklist Completed: patient identified, IV checked, site marked, risks and benefits discussed, monitors and equipment checked, pre-op evaluation and timeout performed  Epidural Patient position: sitting Prep: DuraPrep Patient monitoring: heart rate, cardiac monitor, continuous pulse ox and blood pressure Approach: midline Location: L2-L3 Injection technique: LOR saline  Needle:  Needle type: Tuohy  Needle gauge: 17 G Needle length: 9 cm Needle insertion depth: 6 cm Catheter type: closed end flexible Catheter size: 20 Guage Catheter at skin depth: 11 cm Test dose: negative and Other  Assessment Events: blood not aspirated, injection not painful, no injection resistance and negative IV test  Additional Notes Informed consent obtained prior to proceeding including risk of failure, 1% risk of PDPH, risk of minor discomfort and bruising.  Discussed rare but serious complications including epidural abscess, permanent nerve injury, epidural hematoma.  Discussed alternatives to epidural analgesia and patient desires to proceed.  Timeout performed pre-procedure verifying patient name, procedure, and platelet count.  Patient tolerated procedure well.

## 2021-05-04 NOTE — Discharge Summary (Addendum)
Postpartum Discharge Summary  Date of Service updated 05/06/2021     Patient Name: Monica Vazquez DOB: [birth year] MRN: [id number]  Date of admission: 05/03/2021 Delivery date:05/04/2021  Delivering provider: Clarnce Flock  Date of discharge: 05/06/2021  Admitting diagnosis: Post-dates pregnancy [O48.0] Intrauterine pregnancy: 106w0d     Secondary diagnosis:  Active Problems:   Suboxone maintenance treatment complicating pregnancy, antepartum ([place])   Smoker   Anxiety   Rubella non-immune status, antepartum   Cocaine abuse affecting pregnancy in first trimester ([place])   Abn chromsoml and genetic find on antenat screen of mother   GBS (group B Streptococcus carrier), +RV culture, currently pregnant   Oligohydramnios  Additional problems: none    Discharge diagnosis: Term Pregnancy Delivered                                              Post partum procedures:none Augmentation: AROM, Pitocin, Cytotec and IP Foley Complications: None  Hospital course: Induction of Labor With Vaginal Delivery   25 y.o. yo G2P0010 at [redacted]w[redacted]d was admitted to the hospital 05/03/2021 for induction of labor.  Indication for induction: oligohydramnios.  Patient had an uncomplicated labor course as follows: Membrane Rupture Time/Date: 11:28 AM ,05/04/2021   Delivery Method:Vaginal, Spontaneous  Episiotomy: None  Lacerations:  Labial  Details of delivery can be found in separate delivery note.  Patient had a routine postpartum course. She was continued on her PTA suboxone. She was seen by social work and cleared for discharge. Patient is discharged home 05/06/21.  Newborn Data: Birth date:05/04/2021  Birth time:5:22 PM  Gender:Female  Living status:Living  Apgars:8 ,9  Weight:2860 g   Magnesium Sulfate received: No BMZ received: No Rhophylac:N/A MMR: Offered before discharge T-DaP:Given prenatally Flu: Yes Transfusion:No  Physical exam  Vitals:   05/05/21 0955 05/05/21 1617 05/05/21 2137 05/06/21 0600   BP: 108/61 (!) 148/71 (!) 127/94 134/84  Pulse: 65 69 84 67  Resp: $Remo'18 18 18 18  'srHzy$ Temp: 98 F (36.7 C) (!) 97.4 F (36.3 C) 98.1 F (36.7 C) 97.9 F (36.6 C)  TempSrc: Axillary Oral  Oral  SpO2: 98%   98%  Weight:      Height:       General: alert, cooperative and no distress Lochia: appropriate Uterine Fundus: firm Incision: N/A DVT Evaluation: No evidence of DVT seen on physical exam. Labs: Lab Results  Component Value Date   WBC 12.8 (H) 05/03/2021   HGB 10.9 (L) 05/03/2021   HCT 34.1 (L) 05/03/2021   MCV 86.5 05/03/2021   PLT 185 05/03/2021   CMP Latest Ref Rng & Units 10/12/2020  Glucose 70 - 99 mg/dL 112(H)  BUN 6 - 20 mg/dL 9  Creatinine 0.44 - 1.00 mg/dL 0.60  Sodium 135 - 145 mmol/L 134(L)  Potassium 3.5 - 5.1 mmol/L 3.5  Chloride 98 - 111 mmol/L 104  CO2 22 - 32 mmol/L 21(L)  Calcium 8.9 - 10.3 mg/dL 9.1  Total Protein 6.5 - 8.1 g/dL 6.7  Total Bilirubin 0.3 - 1.2 mg/dL 0.5  Alkaline Phos 38 - 126 U/L 40  AST 15 - 41 U/L 15  ALT 0 - 44 U/L 12   Edinburgh Score: Edinburgh Postnatal Depression Scale Screening Tool 05/05/2021  I have been able to laugh and see the funny side of things. 0  I have looked forward with enjoyment to things. 1  I have blamed myself unnecessarily when things went wrong. 1  I have been anxious or worried for no good reason. 2  I have felt scared or panicky for no good reason. 0  Things have been getting on top of me. 1  I have been so unhappy that I have had difficulty sleeping. 0  I have felt sad or miserable. 0  I have been so unhappy that I have been crying. 1  The thought of harming myself has occurred to me. 0  Edinburgh Postnatal Depression Scale Total 6     After visit meds:  Allergies as of 05/06/2021      Reactions   Hydrocodone Itching   SKIN BURNING. Pt states she can tolerate oxycodone      Medication List    STOP taking these medications   Blood Pressure Monitor Misc   famotidine 20 MG tablet Commonly  known as: Pepcid   ondansetron 4 MG disintegrating tablet Commonly known as: Zofran ODT     TAKE these medications   acetaminophen 325 MG tablet Commonly known as: Tylenol Take 2 tablets (650 mg total) by mouth every 4 (four) hours as needed (for pain scale < 4).   Buprenorphine HCl-Naloxone HCl 8-2 MG Film Commonly known as: Suboxone Place 1 Film under the tongue in the morning and at bedtime for 15 days.   cyclobenzaprine 5 MG tablet Commonly known as: FLEXERIL Take 1 tablet (5 mg total) by mouth 3 (three) times daily as needed for muscle spasms.   ibuprofen 600 MG tablet Commonly known as: ADVIL Take 1 tablet (600 mg total) by mouth every 6 (six) hours.   naloxone 4 MG/0.1ML Liqd nasal spray kit Commonly known as: NARCAN Use in case of overdose   polyethylene glycol 17 g packet Commonly known as: MiraLax Take 17 g by mouth daily.   Prenatal Vitamins 28-0.8 MG Tabs Take 1 tablet by mouth daily.        Discharge home in stable condition Infant Feeding: Breast Infant Disposition:home with mother Discharge instruction: per After Visit Summary and Postpartum booklet. Activity: Advance as tolerated. Pelvic rest for 6 weeks.  Diet: routine diet Future Appointments: Future Appointments  Date Time Provider [place]  06/19/2021 10:15 AM Chancy Milroy, MD [facility]   Follow up Visit:   Please schedule this patient for a In person postpartum visit in 4 weeks with the following provider: MD. Additional Postpartum F/U: Suboxone follow up with Dr. Dione Plover within the week. High risk pregnancy complicated by: opioid use disorder Delivery mode:  Vaginal, Spontaneous  Anticipated Birth Control:  Depo before discharge   05/06/2021 Janet Berlin, MD  I saw and evaluated the patient. I agree with the findings and the plan of care as documented in the resident's note.  Sharene Skeans, MD [facility] Family Medicine Fellow, [facility] for [facility] [place]

## 2021-05-05 NOTE — Clinical Social Work Maternal (Addendum)
CLINICAL SOCIAL WORK MATERNAL/CHILD NOTE  Patient Details  Name: Boy Carmina Bellew MRN: [id number] Date of Birth: 05/04/2019  Date:  05/05/2021  Clinical Social Worker Initiating Note:  Peggy Loge, MSW, LCSWA Date/Time: Initiated:  05/05/21/0915     Child's Name:  Jamarrion Martin   Biological Parents:  Mother,Father (Elton Martin)   Need for Interpreter:  None   Reason for Referral:  Current Substance Use/Substance Use During Pregnancy    Address:  [address] [place] [address]    Phone number:  [phone] (home)     Additional phone number:   Household Members/Support Persons (HM/SP):   Household Member/Support Person 1,Household Member/Support Person 2   HM/SP Name Relationship DOB or Age  HM/SP -1 John Wiemann Father 10/01/1969  HM/SP -2 Elton Martin Significant Other 04/29/1992  HM/SP -3        HM/SP -4        HM/SP -5        HM/SP -6        HM/SP -7        HM/SP -8          Natural Supports (not living in the home):  Immediate Family,Extended Family   Professional Supports: None   Employment: Part-time   Type of Work: [employer]   Education:  Other (comment) (10th grade)   Homebound arranged:    Financial Resources:  Medicaid   Other Resources:  WIC   Cultural/Religious Considerations Which May Impact Care:    Strengths:  Ability to meet basic needs ,Pediatrician chosen,Home prepared for child    Psychotropic Medications:         Pediatrician:    [place]  Pediatrician List:   [place]    [place]    [place]    [place] [facility]  [place]    [place]      Pediatrician Fax Number:    Risk Factors/Current Problems:  Substance Use    Cognitive State:  Alert ,Insightful ,Linear Thinking    Mood/Affect:  Interested ,Calm ,Relaxed ,Bright    CSW Assessment: CSW consulted for THC and cocaine positive UDS on admission. Infant  UDS also positive for THC and cocaine.  CSW met with [name] to complete assessment and offer support. CSW introduced self and role. CSW observed FOB performing skin to skin with infant in recliner. CSW offered to return to speak with [name] in private. [name] declined and stated anything can be discussed with FOB present. CSW informed [name] of reason for consult. [name] was welcoming, pleasant and honest throughout interaction. [name] reported she used THC throughout the pregnancy, stating she recently quit about 3 weeks ago. [name] shared she used to help with her appetite and anxiety. [name] stated prior to quitting she smoked daily. CSW asked [name] about being positive for cocaine and Oxycodone. [name] disclosed that although she used cocaine on and off prior to pregnancy, she believes the weed she smoked was laced with cocaine. [name] stated she has not knowingly used cocaine during the pregnancy. [name] reported she used Oxycodone on and off for pain, with her last use being a couple of weeks ago. [name] shared she obtained the Oxycodone off the streets. CSW inquired on [name] Suboxone use. [name] stated she is prescribed Suboxone 8mg by Dr. Eckstat. [name] reported the treatment is helpful. [name] disclosed she had a previous addiction to opiates and went into rehab in 2020, where she stayed for 2 weeks. [name]   stated she was first prescribed Suboxone at that time and was unable to have it prescribed once she left rehab. [name] shared this led to her purchasing Suboxone off the street until she was connected with Dr. Dione Plover. CSW asked [name] if she would like additonal resources for substance abuse treatment. [name] declined and expressed she does not plan to use THC anymore and that she is able to remain stable. CSW asked FOB if he uses any substances. FOB reported he only uses THC. [name] further disclosed that FOB is also quitting THC, considering he is currently on probation for a charge unrelated to children. CSW informed [name] of the hospital drug screen policy. [name]  aware of infant's positive UDS and that a CPS report will be made to [facility]. CSW informed [name] the CDS will be followed. No previous CPS history considering this is the first child for both parents. [name] expressed understanding and denied any questions.   CSW inquired on [name] mental health history. [name] reported she has a history of anxiety and ADHD, which she was diagnosed with 4 to 5 years ago. [name] shared she experienced some anxiety during pregnancy, stating she copes by utilizing breathing exercises and talking to FOB. [name] reported she was on Adderall prescribed by her PCP prior to pregnancy, and that she plans to restart postpartum. [name] stated the medication is helpful. [name] denies ever attending therapy. [name] reported she is currently feeling great and denies any SI or HI. [name] identified a strong support system consisting of FOB, her father, in-laws and sister.  [name] reported she lives with FOB and her father. [name] is currently employed, receiving [employer] resources. CSW informed [name] she can contact [employer] to provide infant's information and to reach out to DSS if interested in food stamps.   CSW provided education regarding the baby blues period versus PPD and provided resources. CSW provided the New Mom Checklist and encouraged [name] to self evaluate and contact a medical professional if symptoms are noted at any time.  CSW provided review of Sudden Infant Death Syndrome (SIDS) precautions. [name] reported she has all essentials for infant, including a bassinet and car seat. [name] denies any transportation barriers to care. [name] reported she has no additional resource needs at this time.   CSW filed report with [facility] CPS. CSW will continue to follow CDS.  Barriers to discharge at this time.  CSW Plan/Description:  CSW Will Continue to Monitor Umbilical Cord Tissue Drug Screen Results and Make Report if Warranted,Child Protective Service Report ,Hospital Drug Screen Policy Information,Sudden  Infant Death Syndrome (SIDS) Education,CSW Awaiting CPS Disposition Plan,Other Information/Referral to [employer] Resources,Other Patient/Family Education,Perinatal Mood and Anxiety Disorder (PMADs) Education    Waylan Boga, LCSWA 05/05/2021, 10:23 AM

## 2021-05-05 NOTE — Social Work (Signed)
CSW escorted [place] CPS SW Darian to [name]'s room for assessment.   Per CPS SW, there are no barriers to discharge. CPS will continue to provide services postpartum.  Deryk Bozman, MSW, LCSWA Clinical Social Work [facility] [phone]  

## 2021-05-05 NOTE — Anesthesia Postprocedure Evaluation (Signed)
Anesthesia Post Note  Patient: Monica Vazquez  Procedure(s) Performed: AN AD HOC LABOR EPIDURAL     Patient location during evaluation: Mother Baby Anesthesia Type: Epidural Level of consciousness: awake and alert and oriented Pain management: satisfactory to patient Vital Signs Assessment: post-procedure vital signs reviewed and stable Respiratory status: spontaneous breathing and nonlabored ventilation Cardiovascular status: stable Postop Assessment: no headache, no backache, no signs of nausea or vomiting, adequate PO intake, patient able to bend at knees and able to ambulate (patient up walking) Anesthetic complications: no   No complications documented.  Last Vitals:  Vitals:   05/05/21 0300 05/05/21 0541  BP: 111/74 117/65  Pulse: 69 71  Resp: 16 18  Temp: 36.5 C 36.7 C  SpO2: 100% 100%    Last Pain:  Vitals:   05/05/21 0541  TempSrc: Axillary  PainSc: 4    Pain Goal:                   Roark Rufo

## 2021-05-06 MED ORDER — ACETAMINOPHEN 325 MG PO TABS: 650.0000 mg | ORAL_TABLET | ORAL | Status: DC | PRN

## 2021-05-06 MED ORDER — IBUPROFEN 600 MG PO TABS: 600.0000 mg | ORAL_TABLET | Freq: Four times a day (QID) | ORAL | 0 refills | Status: DC

## 2021-05-06 MED ORDER — CYCLOBENZAPRINE HCL 5 MG PO TABS: 5.0000 mg | ORAL_TABLET | Freq: Three times a day (TID) | ORAL | 0 refills | Status: DC | PRN

## 2021-05-06 MED ORDER — MEDROXYPROGESTERONE ACETATE 150 MG/ML IM SUSP: 150.0000 mg | Freq: Once | INTRAMUSCULAR | Status: DC

## 2021-05-06 MED ORDER — MEASLES, MUMPS & RUBELLA VAC IJ SOLR
0.5000 mL | Freq: Once | INTRAMUSCULAR | Status: AC
  Administered 2021-05-06: 0.5 mL via SUBCUTANEOUS
  Filled 2021-05-06: qty 0.5

## 2021-05-06 MED ORDER — POLYETHYLENE GLYCOL 3350 17 G PO PACK
17.0000 g | PACK | Freq: Every day | ORAL | 0 refills | Status: DC
Start: 2021-05-06 — End: 2021-06-20

## 2021-05-06 NOTE — Progress Notes (Signed)
OB Note OB Note D/w mom re: circs for the babies and risk of infection, bleeding, additional surgery and it's considered elective as there is some evidence for decreased risk of UTIs, STDs, HIV, penile cancer in other countries. Patient would like to proceed.  Patient on 96 hour observation for NAS. I d/w RN and will see if can do today but if not then sometime before baby goes home.   Cornelia Copa MD Attending Center for [employer] (Faculty Practice) 05/06/2021 Time: 0900

## 2021-05-08 ENCOUNTER — Inpatient Hospital Stay (HOSPITAL_COMMUNITY): Payer: Medicaid Other

## 2021-05-08 ENCOUNTER — Inpatient Hospital Stay (HOSPITAL_COMMUNITY)
Admission: AD | Admit: 2021-05-08 | Payer: Medicaid Other | Source: Home / Self Care | Admitting: Obstetrics & Gynecology

## 2021-05-09 ENCOUNTER — Ambulatory Visit: Payer: Self-pay

## 2021-05-09 NOTE — Lactation Note (Signed)
This note was copied from a baby's chart. Lactation Consultation Note  Patient Name: Monica Vazquez [id number] Date: 05/09/2021 Reason for consult: Follow-up assessment;Term;Other (Comment) Age:23 days  I was asked by NP to see Mom for breast management purposes.   Mom's milk is in. Mom does not have a pump at home. I provided a hand pump; I noted that her nipples are oblong-shaped. I started with size 27 flanges, but she needed size 30 flanges.   I instructed Mom to only pump what she needs to so that she is comfortable & then discard the milk. I also talked about use of cabbage leaves.  Infant was fussy while I was in the room. Mom said she aims to feed infant 20-30 mL per feeding. I told her that b/c of infant's age, she needs to strive for 45-60 mL. Infant was using an extra-slow flow nipple. Infant's swallows sounded comfortable. I showed Dad how to pace infant when needed.   Mom knows how to reach [name] for any post-discharge questions.   Lurline Hare [facility], [facility], [facility] 05/09/2021, 12:53 PM

## 2021-05-10 ENCOUNTER — Other Ambulatory Visit: Payer: Self-pay | Admitting: Family Medicine

## 2021-05-10 DIAGNOSIS — F1121 Opioid dependence, in remission: Secondary | ICD-10-CM | POA: Insufficient documentation

## 2021-05-10 MED ORDER — BUPRENORPHINE HCL-NALOXONE HCL 8-2 MG SL FILM: 8.0000 mg | ORAL_FILM | Freq: Two times a day (BID) | SUBLINGUAL | 0 refills | Status: DC

## 2021-05-16 ENCOUNTER — Ambulatory Visit (INDEPENDENT_AMBULATORY_CARE_PROVIDER_SITE_OTHER): Payer: Medicaid Other | Admitting: Family Medicine

## 2021-05-16 ENCOUNTER — Other Ambulatory Visit: Payer: Self-pay

## 2021-05-16 ENCOUNTER — Encounter: Payer: Self-pay | Admitting: Family Medicine

## 2021-05-16 ENCOUNTER — Ambulatory Visit: Payer: Medicaid Other | Admitting: Obstetrics and Gynecology

## 2021-05-16 DIAGNOSIS — O99320 Drug use complicating pregnancy, unspecified trimester: Secondary | ICD-10-CM

## 2021-05-16 DIAGNOSIS — F1120 Opioid dependence, uncomplicated: Secondary | ICD-10-CM

## 2021-05-16 DIAGNOSIS — F1121 Opioid dependence, in remission: Secondary | ICD-10-CM

## 2021-05-16 MED ORDER — BUPRENORPHINE HCL-NALOXONE HCL 8-2 MG SL FILM
8.0000 mg | ORAL_FILM | Freq: Two times a day (BID) | SUBLINGUAL | 0 refills | Status: DC
Start: 2021-05-16 — End: 2021-05-24

## 2021-05-16 NOTE — Assessment & Plan Note (Signed)
Patient doing well on current dose. Discussed we may need to titrate down a bit post partum but will need to wait a bit and see. Offered to space out next visit to 4 weeks for PP visit but given this is high risk time for relapse I suggested we do a 2 wk visit, and she is in agreement with this plan. She did not fill the last rx I sent for her and so has a script for 7 days waiting at the pharmacy, will send an additional 8 days and see her in two weeks. UDS collected today, denies use of any other illicits.

## 2021-05-16 NOTE — Progress Notes (Signed)
Pt here today for suboxone medication and would like Depo Provera injection.

## 2021-05-16 NOTE — Patient Instructions (Signed)

## 2021-05-16 NOTE — Progress Notes (Signed)
GYNECOLOGY OFFICE VISIT NOTE  History:   Monica Vazquez is a 25 y.o. G2P1011 here today for follow up after NSVD and management of OUD.  Patient doing well post partum Lots of ups and down emotionally but overall very happy with baby She is bottle feeding Taking suboxone  BID as prescribed  Health Maintenance Due  Topic Date Due   COVID-19 Vaccine (1) Never done   Pneumococcal Vaccine 50-34 Years old (1 - PCV) Never done   HPV VACCINES (1 - 2-dose series) Never done    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   Claustrophobia     Past Surgical History:  Procedure Laterality Date   implanon     TYMPANOMASTOIDECTOMY Right 09/12/2015   Procedure: RIGHT MODIFIED RADICAL TYMPANOMASTOIDECTOMY;  Surgeon: Newman Pies, MD;  Location: [facility];  Service: ENT;  Laterality: Right;   WISDOM TOOTH EXTRACTION      The following portions of the patient's history were reviewed and updated as appropriate: allergies, current medications, past family history, past medical history, past social history, past surgical history and problem list.   Health Maintenance:   Last pap: Lab Results  Component Value Date   DIAGPAP (A) 11/11/2020    - Atypical squamous cells of undetermined significance (ASC-US)   HPVHIGH Negative 11/11/2020    Last mammogram:  N/a   Review of Systems:  Pertinent items noted in HPI and remainder of comprehensive ROS otherwise negative.  Physical Exam:  BP 124/86   Pulse 97   Wt 180 lb 12.8 oz (82 kg)   LMP  (LMP Unknown)   Breastfeeding No   BMI 28.32 kg/m  CONSTITUTIONAL: Well-developed, well-nourished female in no acute distress.  HEENT:  Normocephalic, atraumatic. External right and left ear normal. No scleral icterus.  NECK: Normal range of motion, supple, no masses noted on observation SKIN: No rash noted. Not diaphoretic. No erythema. No  pallor. MUSCULOSKELETAL: Normal range of motion. No edema noted. NEUROLOGIC: Alert and oriented to person, place, and time. Normal muscle tone coordination.  PSYCHIATRIC: Normal mood and affect. Normal behavior. Normal judgment and thought content. RESPIRATORY: Effort normal, no problems with respiration noted  Labs and Imaging No results found for this or any previous visit (from the past 168 hour(s)). US FETAL BPP W/NONSTRESS  Result Date: 05/03/2021 ----------------------------------------------------------------------  OBSTETRICS REPORT                       (Signed Final 05/03/2021 03:13 pm) ---------------------------------------------------------------------- Patient Info  ID #:       [id number]                          D.O.B.:  11/30/1996 (25 yrs)  Name:       Monica Vazquez [facility]               Visit Date: 05/03/2021 03:04 pm ---------------------------------------------------------------------- Performed By  Attending:        Candelaria Celeste DO       Ref. Address:     [address]  [address]                                                             [place], [place]                                                             [address]  Performed By:     Sedalia Muta Day RNC          [facility]         [facility]  Referred By:      Venora Maples MD ---------------------------------------------------------------------- Orders  #  Description                           Code        Ordered By  1  US FETAL BPP W/NONSTRESS              44315.4     Candelaria Celeste ----------------------------------------------------------------------  #  Order #                      Accession #                Episode #  1  [id number]                   [id number]                 [id number] ---------------------------------------------------------------------- Service(s) Provided  US Fetal BPP W NST  43276 ---------------------------------------------------------------------- Indications  [redacted] weeks gestation of pregnancy                Z3A.39  Suboxone use                                   O99.320  Oligohydramnios / Decreased amniotic fluid     O41.00X0  volume ---------------------------------------------------------------------- Fetal Evaluation  Num Of Fetuses:         1  Preg. Location:         Intrauterine  Cardiac Activity:       Observed  Presentation:           Cephalic  Amniotic Fluid  AFI FV:      Oligohydramnios  AFI Sum(cm)     %Tile       Largest Pocket(cm)  4.8             < 3         1.9  RUQ(cm)       RLQ(cm)       LUQ(cm)        LLQ(cm)  0             1.9           1.3            1.6 ---------------------------------------------------------------------- Biophysical Evaluation  Amniotic F.V:   Oligohydramnios            F. Tone:        Observed  F. Movement:    Observed                   N.S.T:          Reactive  F. Breathing:   Observed                   Score:          8/10 ---------------------------------------------------------------------- Gestational Age  LMP:           41w 3d        Date:  07/17/20                 EDD:   04/23/21  Best:          39w 6d     Det. ByMarcella Dubs         EDD:   05/04/21                                      (10/06/20) ---------------------------------------------------------------------- Impression  New onset oligohydramnios. BPP 8/10. ---------------------------------------------------------------------- Recommendations  Induction of labor recommended. Discussed with patient.  Patient sent to L&D. ----------------------------------------------------------------------                   Candelaria Celeste, DO  Electronically Signed Final Report   05/03/2021 03:13 pm ----------------------------------------------------------------------  US FETAL BPP W/NONSTRESS  Result Date: 04/25/2021 ----------------------------------------------------------------------  OBSTETRICS REPORT                       (Signed Final 04/25/2021 01:21 pm) ---------------------------------------------------------------------- Patient Info  ID #:       [id number]                          D.O.B.:  1996/08/04 (25  yrs)  Name:       Monica Vazquez [facility]               Visit Date: 04/25/2021 08:45 am ---------------------------------------------------------------------- Performed By  Attending:        Merian Capron MD     Ref. Address:     [address]                                                             [place], [place]                                                             [address]  Performed By:     Marylynn Pearson RN      Location[facility]         [facility]                                                             [facility]                                                             [facility]                                                             [facility]  Referred By:      Venora Maples MD ---------------------------------------------------------------------- Orders  #  Description                           Code        Ordered By  1  US FETAL BPP W/NONSTRESS  [id number]     Marquetta Weiskopf ----------------------------------------------------------------------  #  Order #                     Accession #                Episode #  1  [id number]                   [id number]                 [id number] ---------------------------------------------------------------------- Indications  [redacted] weeks gestation of pregnancy                Z3A.38  Suboxone use                                    O99.320  Oligohydramnios / Decreased amniotic fluid     O41.00X0  volume ---------------------------------------------------------------------- Fetal Evaluation  Num Of Fetuses:         1  Preg. Location:         Intrauterine  Cardiac Activity:       Observed  Fetal Lie:              Maternal right side  Presentation:           Cephalic  Amniotic Fluid  AFI FV:      Subjectively low-normal  AFI Sum(cm)     %Tile       Largest Pocket(cm)  8.45            15          3.52  RUQ(cm)       RLQ(cm)       LUQ(cm)        LLQ(cm)  3.52          1.65          2.15           1.13 ---------------------------------------------------------------------- Biophysical Evaluation  Amniotic F.V:   Pocket => 2 cm             F. Tone:        Observed  F. Movement:    Observed                   N.S.T:          Reactive  F. Breathing:   Observed                   Score:          10/10 ---------------------------------------------------------------------- Gestational Age  LMP:           40w 2d        Date:  07/17/20                 EDD:   04/23/21  Best:          38w 5d     Det. By:  Marcella DubsEarly Ultrasound         EDD:   05/04/21                                      (10/06/20) ---------------------------------------------------------------------- Impression  Antenatal testing due to maternal opioid use disorder and  borderline growth (12%) and AFI. BPP 10/10,  MVP 3.5 cm. ---------------------------------------------------------------------- Recommendations  -Continue  weekly BPP till delivery. ----------------------------------------------------------------------                Merian Capron, MD Electronically Signed Final Report   04/25/2021 01:21 pm ----------------------------------------------------------------------     Assessment and Plan:   Problem List Items Addressed This Visit       Other   Suboxone maintenance treatment complicating pregnancy, antepartum [facility])    Patient doing well on current dose. Discussed we  may need to titrate down a bit post partum but will need to wait a bit and see. Offered to space out next visit to 4 weeks for PP visit but given this is high risk time for relapse I suggested we do a 2 wk visit, and she is in agreement with this plan. She did not fill the last rx I sent for her and so has a script for 7 days waiting at the pharmacy, will send an additional 8 days and see her in two weeks. UDS collected today, denies use of any other illicits.        Relevant Orders   Pain Mgt Scrn (14 Drugs), Ur   Opioid use disorder, moderate, in sustained remission (HCC)   Relevant Medications   Buprenorphine HCl-Naloxone HCl (SUBOXONE) 8-2 MG FILM   Other Visit Diagnoses     Vaginal delivery    -  Primary       Routine preventative health maintenance measures emphasized. Please refer to After Visit Summary for other counseling recommendations.   Return in about 2 weeks (around 05/30/2021) for follow up OUD.    Total face-to-face time with patient: 15 minutes.  Over 50% of encounter was spent on counseling and coordination of care.   Venora Maples, MD/MPH [facility] [employer], [facility]

## 2021-05-18 ENCOUNTER — Telehealth: Payer: Self-pay | Admitting: Clinical

## 2021-05-18 NOTE — Telephone Encounter (Signed)
Attempt to f/u with pt postpartum;Left HIPPA-compliant message to call back [name] from [facility] [employer] at [facility] at  [phone] [facility] office); will leave MyChart message for pt.

## 2021-05-22 LAB — PAIN MGT SCRN (14 DRUGS), UR
Amphetamine Scrn, Ur: NEGATIVE ng/mL
BARBITURATE SCREEN URINE: NEGATIVE ng/mL
BENZODIAZEPINE SCREEN, URINE: NEGATIVE ng/mL
CANNABINOIDS UR QL SCN: NEGATIVE ng/mL
Cocaine (Metab) Scrn, Ur: NEGATIVE ng/mL
Creatinine(Crt), U: 146.9 mg/dL (ref 20.0–300.0)
Fentanyl, Urine: NEGATIVE pg/mL
Meperidine Screen, Urine: NEGATIVE ng/mL
Methadone Screen, Urine: NEGATIVE ng/mL
OXYCODONE+OXYMORPHONE UR QL SCN: NEGATIVE ng/mL
Opiate Scrn, Ur: NEGATIVE ng/mL
Ph of Urine: 5.7 (ref 4.5–8.9)
Phencyclidine Qn, Ur: NEGATIVE ng/mL
Propoxyphene Scrn, Ur: NEGATIVE ng/mL
Tramadol Screen, Urine: NEGATIVE ng/mL

## 2021-05-24 ENCOUNTER — Other Ambulatory Visit: Payer: Self-pay | Admitting: Family Medicine

## 2021-05-24 DIAGNOSIS — F1121 Opioid dependence, in remission: Secondary | ICD-10-CM

## 2021-05-24 NOTE — BH Specialist Note (Addendum)
Integrated Behavioral Health via Telemedicine Visit  05/24/2021 ANISSIA WESSELLS [id number]  Number of Integrated Behavioral Health visits: 1 Session Start time: 3:20  Session End time: 3:33 Total time:  13  Referring Provider: Merian Capron, MD Patient/Family location: Home [facility] Provider location: [facility]  All persons participating in visit: Patient Monica Vazquez and [facility] Record   Types of Service: Telephone visit  I connected with Phil Dopp and/or Audris D Slawinski's  n/a  via  Telephone or Video Enabled Telemedicine Application  (Video is Caregility application) and verified that I am speaking with the correct person using two identifiers. Discussed confidentiality: Yes   I discussed the limitations of telemedicine and the availability of in person appointments.  Discussed there is a possibility of technology failure and discussed alternative modes of communication if that failure occurs.  I discussed that engaging in this telemedicine visit, they consent to the provision of behavioral healthcare and the services will be billed under their insurance.  Patient and/or legal guardian expressed understanding and consented to Telemedicine visit: Yes   Presenting Concerns: Patient and/or family reports the following symptoms/concerns: Pt states primary symptom is poor appetite; sleeping when baby sleeps, good support at home, bonding well with baby, taking all medications prescribed; no other concern at this time.  Duration of problem: Ongoing; Severity of problem: mild  Patient and/or Family's Strengths/Protective Factors: Social connections, Concrete supports in place (healthy food, safe environments, etc.), Sense of purpose, and Physical Health (exercise, healthy diet, medication compliance, etc.)  Goals Addressed: Patient will:  Reduce symptoms of: stress   Increase knowledge and/or ability of: healthy  habits   Demonstrate ability to: Increase healthy adjustment to current life circumstances  Progress towards Goals: Ongoing  Interventions: Interventions utilized:  Psychoeducation and/or Health Education and Supportive Reflection Standardized Assessments completed: GAD-7 and PHQ 9  Patient and/or Family Response: Pt   Assessment: Patient currently experiencing ADHD and OUD, moderate, in sustained remission  Patient may benefit from continued psychoeducation and brief therapeutic interventions regarding coping with adjusting to new motherhood .  Plan: Follow up with behavioral health clinician on : [name] will call in about two weeks for brief check-in; call [name] as needed at [phone] Behavioral recommendations:  -Continue taking all medications prescribed -Consider taking prenatal vitamin until postpartum medical appointment -Continue sleeping when baby sleeps; prioritizing healthy self-care  -Consider placing healthy snacks within arm's reach to eat prior to feeling over-hungry and irritable, while adjusting to new routine  Referral(s): Integrated [employer] (In Clinic)  I discussed the assessment and treatment plan with the patient and/or parent/guardian. They were provided an opportunity to ask questions and all were answered. They agreed with the plan and demonstrated an understanding of the instructions.   They were advised to call back or seek an in-person evaluation if the symptoms worsen or if the condition fails to improve as anticipated.  Rae Lips, LCSW  Depression screen [facility] 2/9 05/25/2021 05/16/2021 05/03/2021 04/25/2021 04/21/2021  Decreased Interest 1 0 0 1 0  Down, Depressed, Hopeless 1 1 0 0 0  PHQ - 2 Score 2 1 0 1 0  Altered sleeping 0 0 0 0 0  Tired, decreased energy 1 0 1 0 0  Change in appetite 3 1 0 0 0  Feeling bad or failure about yourself  0 0 0 0 0  Trouble concentrating 0 0 0 0 0  Moving slowly or fidgety/restless 0  0 0 0 0   Suicidal thoughts 0 0 0 0 0  PHQ-9 Score 6 2 1 1  0  Difficult doing work/chores - Not difficult at all - - Not difficult at all  Some recent data might be hidden   GAD 7 : Generalized Anxiety Score 05/25/2021 05/16/2021 05/03/2021 04/25/2021  Nervous, Anxious, on Edge 1 1 1  0  Control/stop worrying 1 1 0 0  Worry too much - different things 1 1 0 1  Trouble relaxing 0 0 0 1  Restless 0 0 0 0  Easily annoyed or irritable 1 1 1 1   Afraid - awful might happen 0 0 0 0  Total GAD 7 Score 4 4 2  3

## 2021-05-25 ENCOUNTER — Ambulatory Visit: Payer: HRSA Program | Admitting: Clinical

## 2021-05-25 DIAGNOSIS — F909 Attention-deficit hyperactivity disorder, unspecified type: Secondary | ICD-10-CM

## 2021-05-25 DIAGNOSIS — F1121 Opioid dependence, in remission: Secondary | ICD-10-CM

## 2021-05-25 NOTE — Patient Instructions (Signed)
[facility] [address] [place], [place] [address] [phone] (main office) [phone] (Vercie Pokorny's office)   

## 2021-05-30 ENCOUNTER — Encounter: Payer: Self-pay | Admitting: Family Medicine

## 2021-05-30 ENCOUNTER — Other Ambulatory Visit: Payer: Self-pay

## 2021-05-30 ENCOUNTER — Ambulatory Visit (INDEPENDENT_AMBULATORY_CARE_PROVIDER_SITE_OTHER): Payer: Medicaid Other | Admitting: Family Medicine

## 2021-05-30 VITALS — BP 141/95 | HR 83 | Wt 184.0 lb

## 2021-05-30 DIAGNOSIS — O99320 Drug use complicating pregnancy, unspecified trimester: Secondary | ICD-10-CM

## 2021-05-30 DIAGNOSIS — F1120 Opioid dependence, uncomplicated: Secondary | ICD-10-CM | POA: Diagnosis not present

## 2021-05-30 MED ORDER — BUPRENORPHINE HCL-NALOXONE HCL 8-2 MG SL FILM: 1.0000 | ORAL_FILM | Freq: Two times a day (BID) | SUBLINGUAL | 0 refills | Status: DC

## 2021-05-30 NOTE — Assessment & Plan Note (Signed)
Feeling well and doing great, both mom and baby look well. Stable on 8 mg BID, UDS today, do not anticipate unexpected results per my conversation with Samanatha. Refill given until next visit. If continues to do well will space out to 1 month visits.

## 2021-05-30 NOTE — Progress Notes (Signed)
GYNECOLOGY OFFICE VISIT NOTE  History:   Phil DoppMarissa D Vazquez is a 25 y.o. G2P1011 here today for OUD follow up after recent NSVD.  Seen for close post partum follow up at last visit At that time discussed waiting until formal PP visit or 2 wk check in, she opted for the latter Reports she has been doing well since last visit Much fewer emotional ups and downs Continues to have good family support Baby is with her today and is doing great  Health Maintenance Due  Topic Date Due   COVID-19 Vaccine (1) Never done   Pneumococcal Vaccine 660-23 Years old (1 - PCV) Never done   HPV VACCINES (1 - 2-dose series) Never done    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   Claustrophobia     Past Surgical History:  Procedure Laterality Date   implanon     TYMPANOMASTOIDECTOMY Right 09/12/2015   Procedure: RIGHT MODIFIED RADICAL TYMPANOMASTOIDECTOMY;  Surgeon: Newman PiesSu Teoh, MD;  [facility]:  S[facility];  Service: ENT;  Laterality: Right;   WISDOM TOOTH EXTRACTION      The following portions of the patient's history were reviewed and updated as appropriate: allergies, current medications, past family history, past medical history, past social history, past surgical history and problem list.   Health Maintenance:   Last pap: Lab Results  Component Value Date   DIAGPAP (A) 11/11/2020    - Atypical squamous cells of undetermined significance (ASC-US)   HPVHIGH Negative 11/11/2020    Last mammogram:  N/a   Review of Systems:  Pertinent items noted in HPI and remainder of comprehensive ROS otherwise negative.  Physical Exam:  BP (!) 141/95   Pulse 83   Wt 184 lb (83.5 kg)   LMP  (LMP Unknown)   BMI 28.82 kg/m  CONSTITUTIONAL: Well-developed, well-nourished female in no acute distress.  HEENT:  Normocephalic, atraumatic. External right and left ear normal. No scleral icterus.  NECK:  Normal range of motion, supple, no masses noted on observation SKIN: No rash noted. Not diaphoretic. No erythema. No pallor. MUSCULOSKELETAL: Normal range of motion. No edema noted. NEUROLOGIC: Alert and oriented to person, place, and time. Normal muscle tone coordination.  PSYCHIATRIC: Normal mood and affect. Normal behavior. Normal judgment and thought content. RESPIRATORY: Effort normal, no problems with respiration noted  Labs and Imaging No results found for this or any previous visit (from the past 168 hour(s)). US FETAL BPP W/NONSTRESS  Result Date: 05/03/2021 ----------------------------------------------------------------------  OBSTETRICS REPORT                       (Signed Final 05/03/2021 03:13 pm) ---------------------------------------------------------------------- Patient Info  ID #:       [id number]                          D.O.B.:  03/19/1996 (25 yrs)  Name:       [facility]ISSA D [facility]               Visit Date: 05/03/2021 03:04 pm ---------------------------------------------------------------------- Performed By  Attending:        Candelaria CelesteJacob Stinson DO       Ref. Address:     [address]  [address]                                                             [place], [place]                                                             [address]  Performed By:     Sedalia Muta Day RNC          Location:         [facility]                                                             [facility]                                                             [facility]                                                             [facility]  Referred By:      Venora Maples MD ---------------------------------------------------------------------- Orders  #  Description                           Code        Ordered By  1  US FETAL BPP W/NONSTRESS              09381.8      Candelaria Celeste ----------------------------------------------------------------------  #  Order #                     Accession #                Episode #  1  [id number]                   [id number]                 [id number] ---------------------------------------------------------------------- Service(s) Provided  US Fetal BPP W NST  71062 ---------------------------------------------------------------------- Indications  [redacted] weeks gestation of pregnancy                Z3A.39  Suboxone use                                   O99.320  Oligohydramnios / Decreased amniotic fluid     O41.00X0  volume ---------------------------------------------------------------------- Fetal Evaluation  Num Of Fetuses:         1  Preg. Location:         Intrauterine  Cardiac Activity:       Observed  Presentation:           Cephalic  Amniotic Fluid  AFI FV:      Oligohydramnios  AFI Sum(cm)     %Tile       Largest Pocket(cm)  4.8             < 3         1.9  RUQ(cm)       RLQ(cm)       LUQ(cm)        LLQ(cm)  0             1.9           1.3            1.6 ---------------------------------------------------------------------- Biophysical Evaluation  Amniotic F.V:   Oligohydramnios            F. Tone:        Observed  F. Movement:    Observed                   N.S.T:          Reactive  F. Breathing:   Observed                   Score:          8/10 ---------------------------------------------------------------------- Gestational Age  LMP:           41w 3d        Date:  07/17/20                 EDD:   04/23/21  Best:          39w 6d     Det. ByMarcella Dubs         EDD:   05/04/21                                      (10/06/20) ---------------------------------------------------------------------- Impression  New onset oligohydramnios. BPP 8/10. ---------------------------------------------------------------------- Recommendations  Induction of labor recommended. Discussed with patient.  Patient  sent to L&D. ----------------------------------------------------------------------                   Candelaria Celeste, DO Electronically Signed Final Report   05/03/2021 03:13 pm ----------------------------------------------------------------------     Assessment and Plan:   Problem List Items Addressed This Visit       Other   Suboxone maintenance treatment complicating pregnancy, antepartum (HCC) - Primary    Feeling well and doing great, both mom and baby look well. Stable on 8 mg BID, UDS today, do not anticipate unexpected results per my conversation with Aalivia. Refill given until next visit. If continues to do well will space out to 1 month visits.  Relevant Medications   Buprenorphine HCl-Naloxone HCl (SUBOXONE) 8-2 MG FILM   Other Relevant Orders   Pain Mgt Scrn (14 Drugs), Ur    Routine preventative health maintenance measures emphasized. Please refer to After Visit Summary for other counseling recommendations.   Return in about 3 weeks (around 06/20/2021) for PP check.    Total face-to-face time with patient: 10 minutes.  Over 50% of encounter was spent on counseling and coordination of care.   Venora Maples, MD/MPH Attending Family Medicine Physician, [facility] for [facility], [facility]

## 2021-05-31 LAB — PAIN MGT SCRN (14 DRUGS), UR
Amphetamine Scrn, Ur: NEGATIVE ng/mL
BARBITURATE SCREEN URINE: NEGATIVE ng/mL
BENZODIAZEPINE SCREEN, URINE: NEGATIVE ng/mL
Buprenorphine, Urine: NEGATIVE ng/mL
CANNABINOIDS UR QL SCN: NEGATIVE ng/mL
Cocaine (Metab) Scrn, Ur: NEGATIVE ng/mL
Creatinine(Crt), U: 67.3 mg/dL (ref 20.0–300.0)
Fentanyl, Urine: NEGATIVE pg/mL
Meperidine Screen, Urine: NEGATIVE ng/mL
Methadone Screen, Urine: NEGATIVE ng/mL
OXYCODONE+OXYMORPHONE UR QL SCN: NEGATIVE ng/mL
Opiate Scrn, Ur: NEGATIVE ng/mL
Ph of Urine: 6.1 (ref 4.5–8.9)
Phencyclidine Qn, Ur: NEGATIVE ng/mL
Propoxyphene Scrn, Ur: NEGATIVE ng/mL
Tramadol Screen, Urine: NEGATIVE ng/mL

## 2021-06-08 ENCOUNTER — Telehealth: Payer: Self-pay | Admitting: Clinical

## 2021-06-08 NOTE — Telephone Encounter (Signed)
Attempt brief f/u postpartum check, as agreed-upon; Left HIPPA-compliant message to call back Monica Vazquez from [employer] for Monica Vazquez at [facility] at  [phone] [facility] office).

## 2021-06-19 ENCOUNTER — Ambulatory Visit: Payer: Self-pay | Admitting: Obstetrics and Gynecology

## 2021-06-20 ENCOUNTER — Encounter: Payer: Self-pay | Admitting: Family Medicine

## 2021-06-20 ENCOUNTER — Ambulatory Visit (INDEPENDENT_AMBULATORY_CARE_PROVIDER_SITE_OTHER): Payer: Medicaid Other | Admitting: Family Medicine

## 2021-06-20 ENCOUNTER — Other Ambulatory Visit: Payer: Self-pay

## 2021-06-20 DIAGNOSIS — O99320 Drug use complicating pregnancy, unspecified trimester: Secondary | ICD-10-CM

## 2021-06-20 DIAGNOSIS — F1120 Opioid dependence, uncomplicated: Secondary | ICD-10-CM | POA: Diagnosis not present

## 2021-06-20 MED ORDER — SUBOXONE 8-2 MG SL FILM: 1.0000 | ORAL_FILM | Freq: Two times a day (BID) | SUBLINGUAL | 0 refills | Status: DC

## 2021-06-20 NOTE — Progress Notes (Signed)
Post Partum Visit Note  Monica Vazquez is a 25 y.o. G3P1011 female who presents for a postpartum visit. She is 6 weeks postpartum following a normal spontaneous vaginal delivery.  I have fully reviewed the prenatal and intrapartum course. The delivery was at 40 gestational weeks.  Anesthesia: epidural. Postpartum course has been uneventful. Baby is doing well. Baby is feeding by bottle - Enfamil with Iron. Bleeding no bleeding. Bowel function is normal. Bladder function is normal. Patient is sexually active. Contraception method is Depo-Provera injections. Postpartum depression screening: negative.   The pregnancy intention screening data noted above was reviewed. Potential methods of contraception were discussed. The patient elected to proceed with No data recorded.   Edinburgh Postnatal Depression Scale - 06/20/21 1345       Edinburgh Postnatal Depression Scale:  In the Past 7 Days   I have been able to laugh and see the funny side of things. 0    I have looked forward with enjoyment to things. 0    I have blamed myself unnecessarily when things went wrong. 0    I have been anxious or worried for no good reason. 2    I have felt scared or panicky for no good reason. 0    Things have been getting on top of me. 0    I have been so unhappy that I have had difficulty sleeping. 0    I have felt sad or miserable. 0    I have been so unhappy that I have been crying. 1    The thought of harming myself has occurred to me. 0    Edinburgh Postnatal Depression Scale Total 3             Health Maintenance Due  Topic Date Due   COVID-19 Vaccine (1) Never done   Pneumococcal Vaccine 78-8 Years old (1 - PCV) Never done   HPV VACCINES (1 - 2-dose series) Never done    The following portions of the patient's history were reviewed and updated as appropriate: allergies, current medications, past family history, past medical history, past social history, past surgical history, and problem  list.  Review of Systems Pertinent items noted in HPI and remainder of comprehensive ROS otherwise negative.  Objective:  BP 103/63   Pulse 73   Wt 185 lb 12.8 oz (84.3 kg)   LMP  (LMP Unknown)   Breastfeeding No   BMI 29.10 kg/m    General:  alert, cooperative, and appears stated age   Breasts:  not indicated  Lungs: Comfortable on room air  GU exam:  not indicated       Assessment:    There are no diagnoses linked to this encounter.  Normal postpartum exam.   Plan:   Essential components of care per ACOG recommendations:  1.  Mood and well being: Patient with negative depression screening today. Reviewed local resources for support.  - Patient tobacco use? Yes. Patient desires to quit? Yes.Discussed reduction and cessation  - hx of drug use? Yes. Discussed support systems and outpatient/inpatient treatment options.    2. Infant care and feeding:  -Patient currently breastmilk feeding? No.  -Social determinants of health (SDOH) reviewed in EPIC. No concerns  3. Sexuality, contraception and birth spacing - Patient does not want a pregnancy in the next year.  Desired family size is unsure number of children.  - Reviewed forms of contraception in tiered fashion. Patient desired Depo-Provera today, but she had unprotected intercourse two  days prior, will return in two weeks for pregnancy test and Depo shot.  Will message [facility] to see if it can be done there as she lives much closer to that office.  - Discussed birth spacing of 18 months  4. Sleep and fatigue -Encouraged family/partner/community support of 4 hrs of uninterrupted sleep to help with mood and fatigue  5. Physical Recovery  - Discussed patients delivery and complications. She describes her labor as good. - Patient had a Vaginal, no problems at delivery. Patient had a 1st degree laceration. Perineal healing reviewed. Patient expressed understanding - Patient has urinary incontinence? No. - Patient is  safe to resume physical and sexual activity  6.  Health Maintenance - HM due items addressed Yes - Last pap smear  Diagnosis  Date Value Ref Range Status  11/11/2020 (A)  Final   - Atypical squamous cells of undetermined significance (ASC-US)   Pap smear not done at today's visit.  -Breast Cancer screening indicated? No.   7. Chronic Disease/Pregnancy Condition follow up:  OUD - refill given for suboxone today, will space to 1 month visits - UDS collected - PCP follow up  Venora Maples, MD Center for [facility], [facility]

## 2021-06-23 LAB — PAIN MGT SCRN (14 DRUGS), UR
Amphetamine Scrn, Ur: NEGATIVE ng/mL
BARBITURATE SCREEN URINE: NEGATIVE ng/mL
BENZODIAZEPINE SCREEN, URINE: NEGATIVE ng/mL
CANNABINOIDS UR QL SCN: NEGATIVE ng/mL
Cocaine (Metab) Scrn, Ur: NEGATIVE ng/mL
Creatinine(Crt), U: 148.5 mg/dL (ref 20.0–300.0)
Fentanyl, Urine: NEGATIVE pg/mL
Meperidine Screen, Urine: NEGATIVE ng/mL
Methadone Screen, Urine: NEGATIVE ng/mL
OXYCODONE+OXYMORPHONE UR QL SCN: NEGATIVE ng/mL
Opiate Scrn, Ur: NEGATIVE ng/mL
Ph of Urine: 6.1 (ref 4.5–8.9)
Phencyclidine Qn, Ur: NEGATIVE ng/mL
Propoxyphene Scrn, Ur: NEGATIVE ng/mL
Tramadol Screen, Urine: NEGATIVE ng/mL

## 2021-06-27 ENCOUNTER — Telehealth: Payer: Self-pay | Admitting: General Practice

## 2021-06-27 NOTE — Telephone Encounter (Signed)
-----   Message from Venora Maples, MD sent at 06/25/2021  8:39 PM EDT ----- Please call [facility] and ask them to send a confirmatory test for buprenorphine level please.  ----- Message ----- From: Nell Range Lab Results In Sent: 06/23/2021  12:09 PM EDT To: Venora Maples, MD

## 2021-06-27 NOTE — Telephone Encounter (Signed)
Per Dr Crissie Reese, patient needs to come back into office to drop off urine sample due to laboratory processing issue.  Called patient, no answer- left message to call us back regarding an appt in the office.

## 2021-06-29 NOTE — Telephone Encounter (Signed)
Called pt and left message asking if she could stop the office during office hours closed btwn 12-1 to drop off a urine sample and if she could please call the office.    Addison Naegeli, RN  06/29/21

## 2021-07-25 ENCOUNTER — Other Ambulatory Visit: Payer: Self-pay

## 2021-07-25 ENCOUNTER — Ambulatory Visit (INDEPENDENT_AMBULATORY_CARE_PROVIDER_SITE_OTHER): Payer: Medicaid Other | Admitting: Family Medicine

## 2021-07-25 ENCOUNTER — Encounter: Payer: Self-pay | Admitting: Family Medicine

## 2021-07-25 VITALS — BP 133/79 | HR 105 | Wt 183.7 lb

## 2021-07-25 DIAGNOSIS — F1120 Opioid dependence, uncomplicated: Secondary | ICD-10-CM | POA: Diagnosis not present

## 2021-07-25 DIAGNOSIS — O99320 Drug use complicating pregnancy, unspecified trimester: Secondary | ICD-10-CM

## 2021-07-25 DIAGNOSIS — F1121 Opioid dependence, in remission: Secondary | ICD-10-CM

## 2021-07-25 MED ORDER — SUBOXONE 8-2 MG SL FILM: 1.0000 | ORAL_FILM | Freq: Two times a day (BID) | SUBLINGUAL | 0 refills | Status: DC

## 2021-07-25 NOTE — Progress Notes (Signed)
   GYNECOLOGY OFFICE VISIT NOTE  History:   Monica Vazquez is a 25 y.o. G2P1011 here today for suboxone follow up.  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   Health Maintenance:   Last pap: Lab Results  Component Value Date   DIAGPAP (A) 11/11/2020    - Atypical squamous cells of undetermined significance (ASC-US)   HPVHIGH Negative 11/11/2020    Last mammogram:  N/a    Review of Systems:  Pertinent items noted in HPI and remainder of comprehensive ROS otherwise negative.  Physical Exam:  BP 133/79   Pulse (!) 105   Wt 183 lb 11.2 oz (83.3 kg)   LMP 07/16/2021 (Exact Date)   BMI 28.77 kg/m  CONSTITUTIONAL: Well-developed, well-nourished female in no acute distress.  HEENT:  Normocephalic, atraumatic. External right and left ear normal. No scleral icterus.  NECK: Normal range of motion, supple, no masses noted on observation SKIN: No rash noted. Not diaphoretic. No erythema. No pallor. MUSCULOSKELETAL: Normal range of motion.  No edema noted. NEUROLOGIC: Alert and oriented to person, place, and time. Normal muscle tone coordination.  PSYCHIATRIC: Normal mood and affect. Normal behavior. Normal judgment and thought content. RESPIRATORY: Effort normal, no problems with respiration noted   Labs and Imaging No results found for this or any previous visit (from the past 168 hour(s)). No results found.    Assessment and Plan:   Problem List Items Addressed This Visit       Other   Suboxone maintenance treatment complicating pregnancy, antepartum (HCC)   Relevant Medications   SUBOXONE 8-2 MG FILM   Opioid use disorder, moderate, in sustained remission (HCC) - Primary    Stable Refill sent for 30 days Consider extending to 90 days at next visit UDS today      Relevant Orders   ToxASSURE Select 13 (MW), Urine    Routine preventative health maintenance measures emphasized. Please refer to After Visit Summary for other counseling recommendations.   Return in about 4 weeks (around 08/22/2021) for OUD follow up.    Total face-to-face time with patient: 10 minutes.  Over 50% of encounter was spent on counseling and coordination of care.   Venora Maples, MD/MPH Attending Family Medicine Physician, [facility] for [facility], [facility]

## 2021-07-25 NOTE — Assessment & Plan Note (Signed)
Stable Refill sent for 30 days Consider extending to 90 days at next visit UDS today

## 2021-07-27 LAB — TOXASSURE SELECT 13 (MW), URINE

## 2021-08-22 ENCOUNTER — Encounter: Payer: Self-pay | Admitting: Family Medicine

## 2021-08-22 ENCOUNTER — Ambulatory Visit (INDEPENDENT_AMBULATORY_CARE_PROVIDER_SITE_OTHER): Payer: Medicaid Other | Admitting: Family Medicine

## 2021-08-22 ENCOUNTER — Other Ambulatory Visit: Payer: Self-pay

## 2021-08-22 VITALS — BP 133/86 | HR 120 | Ht 67.0 in | Wt 174.6 lb

## 2021-08-22 DIAGNOSIS — F1121 Opioid dependence, in remission: Secondary | ICD-10-CM | POA: Diagnosis not present

## 2021-08-22 DIAGNOSIS — O99320 Drug use complicating pregnancy, unspecified trimester: Secondary | ICD-10-CM

## 2021-08-22 DIAGNOSIS — F1120 Opioid dependence, uncomplicated: Secondary | ICD-10-CM

## 2021-08-22 MED ORDER — SUBOXONE 8-2 MG SL FILM: 1.0000 | ORAL_FILM | Freq: Two times a day (BID) | SUBLINGUAL | 2 refills | Status: DC

## 2021-08-22 NOTE — Progress Notes (Signed)
   GYNECOLOGY OFFICE VISIT NOTE  History:   Monica Vazquez is a 25 y.o. G2P1011 here today for OUD follow up.  Doing well on Suboxone 8-2 BID No issues Monica Vazquez is growing well  Health Maintenance Due  Topic Date Due   COVID-19 Vaccine (1) Never done   HPV VACCINES (1 - 2-dose series) Never done   INFLUENZA VACCINE  07/03/2021    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   Claustrophobia     Past Surgical History:  Procedure Laterality Date   implanon     TYMPANOMASTOIDECTOMY Right 09/12/2015   Procedure: RIGHT MODIFIED RADICAL TYMPANOMASTOIDECTOMY;  Surgeon: Newman Pies, MD;  Location: [facility];  Service: ENT;  Laterality: Right;   WISDOM TOOTH EXTRACTION      The following portions of the patient's history were reviewed and updated as appropriate: allergies, current medications, past family history, past medical history, past social history, past surgical history and problem list.   Health Maintenance:   Last pap: Lab Results  Component Value Date   DIAGPAP (A) 11/11/2020    - Atypical squamous cells of undetermined significance (ASC-US)   HPVHIGH Negative 11/11/2020     Last mammogram:  N/a    Review of Systems:  Pertinent items noted in HPI and remainder of comprehensive ROS otherwise negative.  Physical Exam:  BP 133/86   Pulse (!) 120   Ht 5\' 7"  (1.702 m)   Wt 174 lb 9.6 oz (79.2 kg)   LMP 08/21/2021   BMI 27.35 kg/m  CONSTITUTIONAL: Well-developed, well-nourished female in no acute distress.  HEENT:  Normocephalic, atraumatic. External right and left ear normal. No scleral icterus.  NECK: Normal range of motion, supple, no masses noted on observation SKIN: No rash noted. Not diaphoretic. No erythema. No pallor. MUSCULOSKELETAL: Normal range of motion. No edema noted. NEUROLOGIC: Alert and oriented to person, place, and time. Normal muscle tone  coordination.  PSYCHIATRIC: Normal mood and affect. Normal behavior. Normal judgment and thought content. RESPIRATORY: Effort normal, no problems with respiration noted  Labs and Imaging No results found for this or any previous visit (from the past 168 hour(s)). No results found.    Assessment and Plan:   Problem List Items Addressed This Visit       Other   Suboxone maintenance treatment complicating pregnancy, antepartum (HCC)   Opioid use disorder, moderate, in sustained remission (HCC) - Primary    Stable UDS today Refill sent for 90 days      Relevant Medications   SUBOXONE 8-2 MG FILM   Other Relevant Orders   ToxASSURE Select 13 (MW), Urine    Routine preventative health maintenance measures emphasized. Please refer to After Visit Summary for other counseling recommendations.   Return in about 3 months (around 11/21/2021) for OUD follow up.      Total face-to-face time with patient: 10 minutes.  Over 50% of encounter was spent on counseling and coordination of care.   11/23/2021, MD/MPH Attending Family Medicine Physician, [facility] for [facility], [facility]

## 2021-08-22 NOTE — Assessment & Plan Note (Signed)
Stable °UDS today °Refill sent for 90 days °

## 2021-08-28 LAB — TOXASSURE SELECT 13 (MW), URINE

## 2021-10-18 ENCOUNTER — Other Ambulatory Visit: Payer: Self-pay | Admitting: Family Medicine

## 2021-10-18 DIAGNOSIS — J069 Acute upper respiratory infection, unspecified: Secondary | ICD-10-CM | POA: Diagnosis not present

## 2021-10-18 DIAGNOSIS — J209 Acute bronchitis, unspecified: Secondary | ICD-10-CM | POA: Diagnosis not present

## 2021-10-18 DIAGNOSIS — J029 Acute pharyngitis, unspecified: Secondary | ICD-10-CM | POA: Diagnosis not present

## 2021-10-18 DIAGNOSIS — F1121 Opioid dependence, in remission: Secondary | ICD-10-CM

## 2021-10-18 DIAGNOSIS — J019 Acute sinusitis, unspecified: Secondary | ICD-10-CM | POA: Diagnosis not present

## 2021-11-14 ENCOUNTER — Ambulatory Visit (INDEPENDENT_AMBULATORY_CARE_PROVIDER_SITE_OTHER): Payer: Medicaid Other | Admitting: Family Medicine

## 2021-11-14 ENCOUNTER — Encounter: Payer: Self-pay | Admitting: Family Medicine

## 2021-11-14 ENCOUNTER — Other Ambulatory Visit: Payer: Self-pay

## 2021-11-14 VITALS — BP 123/79 | HR 87 | Wt 193.0 lb

## 2021-11-14 DIAGNOSIS — F1121 Opioid dependence, in remission: Secondary | ICD-10-CM

## 2021-11-14 MED ORDER — SUBOXONE 8-2 MG SL FILM: 1.0000 | ORAL_FILM | Freq: Two times a day (BID) | SUBLINGUAL | 2 refills | Status: DC

## 2021-11-14 NOTE — Assessment & Plan Note (Signed)
Stable UDS today Refill sent for 90 days

## 2021-11-14 NOTE — Progress Notes (Signed)
° °  GYNECOLOGY OFFICE VISIT NOTE  History:   Monica Vazquez is a 25 y.o. G2P1011 here today for suboxone follow up after delivery.  Patient is doing very well, stable on 8 mg BID Still at home with baby Monica Vazquez who is growing and developing Partner Monica Vazquez is here with patient today as well  Health Maintenance Due  Topic Date Due   COVID-19 Vaccine (1) Never done   Pneumococcal Vaccine 33-63 Years old (1 - PCV) Never done   HPV VACCINES (1 - 2-dose series) Never done   INFLUENZA VACCINE  07/03/2021    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   Claustrophobia     Past Surgical History:  Procedure Laterality Date   implanon     TYMPANOMASTOIDECTOMY Right 09/12/2015   Procedure: RIGHT MODIFIED RADICAL TYMPANOMASTOIDECTOMY;  Surgeon: Newman Pies, MD;  Location: [facility];  Service: ENT;  Laterality: Right;   WISDOM TOOTH EXTRACTION      The following portions of the patient's history were reviewed and updated as appropriate: allergies, current medications, past family history, past medical history, past social history, past surgical history and problem list.   Health Maintenance:   Last pap: Lab Results  Component Value Date   DIAGPAP (A) 11/11/2020    - Atypical squamous cells of undetermined significance (ASC-US)   HPVHIGH Negative 11/11/2020    Last mammogram:  N/a  Review of Systems:  Pertinent items noted in HPI and remainder of comprehensive ROS otherwise negative.  Physical Exam:  BP 123/79    Pulse 87    Wt 193 lb (87.5 kg)    BMI 30.23 kg/m  CONSTITUTIONAL: Well-developed, well-nourished female in no acute distress.  HEENT:  Normocephalic, atraumatic. External right and left ear normal. No scleral icterus.  NECK: Normal range of motion, supple, no masses noted on observation SKIN: No rash noted. Not diaphoretic. No erythema. No pallor. MUSCULOSKELETAL: Normal  range of motion. No edema noted. NEUROLOGIC: Alert and oriented to person, place, and time. Normal muscle tone coordination.  PSYCHIATRIC: Normal mood and affect. Normal behavior. Normal judgment and thought content. RESPIRATORY: Effort normal, no problems with respiration noted   Labs and Imaging No results found for this or any previous visit (from the past 168 hour(s)). No results found.    Assessment and Plan:   Problem List Items Addressed This Visit       Other   Opioid use disorder, moderate, in sustained remission (HCC) - Primary    Stable UDS today Refill sent for 90 days      Relevant Medications   SUBOXONE 8-2 MG FILM   Other Relevant Orders   Pain Mgt Scrn (14 Drugs), Ur    Routine preventative health maintenance measures emphasized. Please refer to After Visit Summary for other counseling recommendations.   Return in about 3 months (around 02/12/2022) for suboxone follow up.      Total face-to-face time with patient: 20 minutes.  Over 50% of encounter was spent on counseling and coordination of care.   Venora Maples, MD/MPH Attending Family Medicine Physician, [facility] for [facility], [facility]

## 2021-11-16 LAB — PAIN MGT SCRN (14 DRUGS), UR
Amphetamine Scrn, Ur: NEGATIVE ng/mL
BARBITURATE SCREEN URINE: NEGATIVE ng/mL
BENZODIAZEPINE SCREEN, URINE: NEGATIVE ng/mL
Buprenorphine, Urine: POSITIVE ng/mL — AB
CANNABINOIDS UR QL SCN: POSITIVE ng/mL — AB
Cocaine (Metab) Scrn, Ur: POSITIVE ng/mL — AB
Creatinine(Crt), U: 74.5 mg/dL (ref 20.0–300.0)
Fentanyl, Urine: NEGATIVE pg/mL
Meperidine Screen, Urine: NEGATIVE ng/mL
Methadone Screen, Urine: NEGATIVE ng/mL
OXYCODONE+OXYMORPHONE UR QL SCN: POSITIVE ng/mL — AB
Opiate Scrn, Ur: NEGATIVE ng/mL
Ph of Urine: 6.5 (ref 4.5–8.9)
Phencyclidine Qn, Ur: NEGATIVE ng/mL
Propoxyphene Scrn, Ur: NEGATIVE ng/mL
Tramadol Screen, Urine: NEGATIVE ng/mL

## 2022-02-13 ENCOUNTER — Ambulatory Visit (INDEPENDENT_AMBULATORY_CARE_PROVIDER_SITE_OTHER): Payer: Medicaid Other | Admitting: Family Medicine

## 2022-02-13 ENCOUNTER — Ambulatory Visit: Payer: Medicaid Other | Admitting: Family Medicine

## 2022-02-13 ENCOUNTER — Other Ambulatory Visit: Payer: Self-pay

## 2022-02-13 ENCOUNTER — Encounter: Payer: Self-pay | Admitting: Family Medicine

## 2022-02-13 VITALS — BP 133/85 | HR 112 | Wt 191.0 lb

## 2022-02-13 DIAGNOSIS — F419 Anxiety disorder, unspecified: Secondary | ICD-10-CM | POA: Diagnosis not present

## 2022-02-13 DIAGNOSIS — F1121 Opioid dependence, in remission: Secondary | ICD-10-CM | POA: Diagnosis not present

## 2022-02-13 MED ORDER — SUBOXONE 8-2 MG SL FILM: 1.0000 | ORAL_FILM | Freq: Three times a day (TID) | SUBLINGUAL | 0 refills | Status: DC

## 2022-02-13 NOTE — Progress Notes (Signed)
? ?GYNECOLOGY OFFICE VISIT NOTE ? ?History:  ? Monica Vazquez is a 26 y.o. G2P1011 here today for Suboxone follow up. ? ?Last few UDS have been abnormal, have been having trouble getting in touch with Desiraye to discuss but turns out she has a different phone number ?She reports a lot of stress over the past few weeks ?Her father (who primarily raised her as her mother struggled with addiction when she was young) has been very ill ?He was diagnosed with Afib and what sounds like CHF and OHS/OSA, admitted for two weeks and is now home on oxygen  ?She has been caring for him throughout this time and admits to some lapses both in taking her suboxone regularly and in some substance use c/w abnormal UDS in the past ?Feels like the stress has been significant enough that she needs to start seeing someone for her mental health ? ?Health Maintenance Due  ?Topic Date Due  ? COVID-19 Vaccine (1) Never done  ? HPV VACCINES (1 - 2-dose series) Never done  ? INFLUENZA VACCINE  07/03/2021  ? ? ?Past Medical History:  ?Diagnosis Date  ? ADHD (attention deficit hyperactivity disorder)   ? 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 Claustrophobia   ? ? ?Past Surgical History:  ?Procedure Laterality Date  ? implanon    ? TYMPANOMASTOIDECTOMY Right 09/12/2015  ? Procedure: RIGHT MODIFIED RADICAL TYMPANOMASTOIDECTOMY;  Surgeon: Newman Pies, MD;  Location: [facility];  Service: ENT;  Laterality: Right;  ? WISDOM TOOTH EXTRACTION    ? ? ?The following portions of the patient's history were reviewed and updated as appropriate: allergies, current medications, past family history, past medical history, past social history, past surgical history and problem list.  ? ?Health Maintenance:   ?Last pap: ?Lab Results  ?Component Value Date  ? DIAGPAP (A) 11/11/2020  ?  - Atypical squamous cells of undetermined significance (ASC-US)  ? HPVHIGH Negative 11/11/2020  ? ?Repeat 11/2023 ? ?Last mammogram:  ?N/a  ? ? ?Review of Systems:   ?Pertinent items noted in HPI and remainder of comprehensive ROS otherwise negative. ? ?Physical Exam:  ?BP 133/85   Pulse (!) 112   Wt 191 lb (86.6 kg)   LMP 02/03/2022 (Exact Date)   Breastfeeding No   BMI 29.91 kg/m?  ?CONSTITUTIONAL: Well-developed, well-nourished female in no acute distress.  ?HEENT:  Normocephalic, atraumatic. External right and left ear normal. No scleral icterus.  ?NECK: Normal range of motion, supple, no masses noted on observation ?SKIN: No rash noted. Not diaphoretic. No erythema. No pallor. ?MUSCULOSKELETAL: Normal range of motion. No edema noted. ?NEUROLOGIC: Alert and oriented to person, place, and time. Normal muscle tone coordination.  ?PSYCHIATRIC: Normal mood and affect. Normal behavior. Normal judgment and thought content. ?RESPIRATORY: Effort normal, no problems with respiration noted ? ? ?Labs and Imaging ?No results found for this or any previous visit (from the past 168 hour(s)). ?No results found.    ?Assessment and Plan:  ? ?Problem List Items Addressed This Visit   ? ?  ? Other  ? Anxiety  ?  Referral placed for [facility] ?  ?  ? Relevant Orders  ? Ambulatory referral to Integrated Behavioral Health  ? Opioid use disorder, moderate, in sustained remission (HCC) - Primary  ?  Admits to some lapses and inconsistent use but is feeling much better now ?Feels like she needs to go up to control cravings, will increase from 8 BID to TID ?UDS sent today ?Thanked [employer]  for her honesty and emphasized we strive for harm reduction approach in clinic, always OK to let us know if she has relapsed or needs extra support ?  ?  ? Relevant Medications  ? SUBOXONE 8-2 MG FILM  ? Other Relevant Orders  ? ToxASSURE Select 13 (MW), Urine  ? ? ?Routine preventative health maintenance measures emphasized. ?Please refer to After Visit Summary for other counseling recommendations.  ? ?Return in about 4 weeks (around 03/13/2022). ?  ?  ? ?Total face-to-face time with patient: 20 minutes.  Over 50%  of encounter was spent on counseling and coordination of care. ? ? ?Venora Maples, MD/MPH ?Attending Family Medicine Physician, Faculty Practice ?Center for [employer], [facility] ? ?

## 2022-02-14 NOTE — Assessment & Plan Note (Signed)
Referral placed for [facility] ?

## 2022-02-14 NOTE — BH Specialist Note (Signed)
Integrated Behavioral Health via Telemedicine Visit ? ?02/14/2022 ?SHANDEL BUSIC ?[id number] ? ?Number of Integrated Behavioral Health Clinician visits: No data recorded ?Session Start time: No data recorded  ?Session End time: No data recorded ?Total time in minutes: No data recorded ? ?Referring Provider: Merian Capron, MD ?Patient/Family location: Home ?[facility] Provider location: [facility] [employer] [facility] ? ?All persons participating in visit: Patient Monica Vazquez and [facility] Deron Poole  ? ?Types of Service: Individual psychotherapy and Video visit ? ?I connected with Phil Dopp and/or Lyberti D Aldrete's  n/a  via  Telephone or Video Enabled Telemedicine Application  (Video is Caregility application) and verified that I am speaking with the correct person using two identifiers. Discussed confidentiality: Yes  ? ?I discussed the limitations of telemedicine and the availability of in person appointments.  Discussed there is a possibility of technology failure and discussed alternative modes of communication if that failure occurs. ? ?I discussed that engaging in this telemedicine visit, they consent to the provision of behavioral healthcare and the services will be billed under their insurance. ? ?Patient and/or legal guardian expressed understanding and consented to Telemedicine visit: Yes  ? ?Presenting Concerns: ?Patient and/or family reports the following symptoms/concerns: Chronic trauma since childhood, most recently escalated by fiance incarcerated, caring for father with health issues. Pt's goals are to "be happy, and be the best mom and fiancee I can be"; motivated for her son to have a healthy and happy childhood.  ?Duration of problem: Ongoing; Severity of problem:  moderately severe ? ?Patient and/or Family's Strengths/Protective Factors: ?Sense of purpose ? ?Goals Addressed: ?Patient will: ? Reduce symptoms of: anxiety, depression, and stress  ?  Increase knowledge and/or ability of: stress reduction  ? Demonstrate ability to: Increase healthy adjustment to current life circumstances, Increase motivation to adhere to plan of care, and Decrease self-medicating behaviors ? ?Progress towards Goals: ?Ongoing ? ?Interventions: ?Interventions utilized:  Motivational Interviewing, CBT Cognitive Behavioral Therapy, and Communication Skills ?Standardized Assessments completed: Not Needed ? ?Patient and/or Family Response: Patient agrees with treatment plan. ? ? ?Assessment: ?Patient currently experiencing Post-traumatic stress disorder; ADHD, OUD, and Psycosocial stress ? ?Patient may benefit from psychoeducation and brief therapeutic interventions regarding coping with symptoms of depression, anxiety, life stress ? ?Plan: ?Follow up with behavioral health clinician on : Two weeks; Call Philipp Callegari at [phone], as needed. ?Behavioral recommendations:  ?-Continue taking Suboxone as prescribed; discuss with PCP about restarting ADHD medication ?-Begin Worry Time strategy, to prioritize focusing time and energy on things within your personal realm of control ?-Know that you absolutely deserve all the good things in life that you want for your son, and be kind to yourself every single day.  ?Referral(s): Integrated [employer] (In Clinic) ? ?I discussed the assessment and treatment plan with the patient and/or parent/guardian. They were provided an opportunity to ask questions and all were answered. They agreed with the plan and demonstrated an understanding of the instructions. ?  ?They were advised to call back or seek an in-person evaluation if the symptoms worsen or if the condition fails to improve as anticipated. ? ?Rae Lips, LCSW ? ? ?  02/13/2022  ?  5:06 PM 11/14/2021  ?  4:31 PM 08/24/2021  ?  1:51 PM 07/25/2021  ? 10:38 AM 05/30/2021  ? 11:36 AM  ?Depression screen PHQ 2/9  ?Decreased Interest 0 0 0 1 1  ?Down, Depressed, Hopeless 1 0 0  1   ?  PHQ - 2 Score 1 0 0 1 2  ?Altered sleeping 0 0 0 1 0  ?Tired, decreased energy 0 0 1 1 1   ?Change in appetite 0 0 0 1 0  ?Feeling bad or failure about yourself  0 0 0 0 0  ?Trouble concentrating 0 0 0 0 0  ?Moving slowly or fidgety/restless 0 0 0 0 0  ?Suicidal thoughts 0 0 0 0 0  ?PHQ-9 Score 1 0 1 4 3   ?Difficult doing work/chores Not difficult at all      ? ? ?  02/13/2022  ?  5:06 PM 11/14/2021  ?  4:31 PM 08/24/2021  ?  1:52 PM 07/25/2021  ? 10:39 AM  ?GAD 7 : Generalized Anxiety Score  ?Nervous, Anxious, on Edge 1 0 0 1  ?Control/stop worrying 0 0 0 0  ?Worry too much - different things 0 0 0 1  ?Trouble relaxing 0 0 1 0  ?Restless 0 0 0 0  ?Easily annoyed or irritable 0 0 1 1  ?Afraid - awful might happen 0 0 0 0  ?Total GAD 7 Score 1 0 2 3  ?Anxiety Difficulty Not difficult at all     ? ? ? ?

## 2022-02-14 NOTE — Assessment & Plan Note (Signed)
Admits to some lapses and inconsistent use but is feeling much better now ?Feels like she needs to go up to control cravings, will increase from 8 BID to TID ?UDS sent today ?Nicholos Johns for her honesty and emphasized we strive for harm reduction approach in clinic, always OK to let us know if she has relapsed or needs extra support ?

## 2022-02-18 LAB — TOXASSURE SELECT 13 (MW), URINE

## 2022-02-22 ENCOUNTER — Ambulatory Visit (INDEPENDENT_AMBULATORY_CARE_PROVIDER_SITE_OTHER): Payer: Medicaid Other | Admitting: Clinical

## 2022-02-22 DIAGNOSIS — F1121 Opioid dependence, in remission: Secondary | ICD-10-CM

## 2022-02-22 DIAGNOSIS — F909 Attention-deficit hyperactivity disorder, unspecified type: Secondary | ICD-10-CM

## 2022-02-22 DIAGNOSIS — F4310 Post-traumatic stress disorder, unspecified: Secondary | ICD-10-CM

## 2022-02-22 DIAGNOSIS — Z658 Other specified problems related to psychosocial circumstances: Secondary | ICD-10-CM

## 2022-02-22 NOTE — Patient Instructions (Addendum)
[facility] [address] [place], [place] [address] [phone] (main office) [phone] (Odile Veloso's office)   

## 2022-02-27 NOTE — BH Specialist Note (Signed)
Integrated Behavioral Health via Telemedicine Visit ? ?03/12/2022 ?Monica Vazquez ?[id number] ? ?Number of Integrated Behavioral Health Clinician visits: 2- Second Visit ? ?Session Start time: 1556 ?  ?Session End time: 1610 ? ?Total time in minutes: 14 ? ? ?Referring Provider: Merian Capron, MD ?Monica/Family location: Friend's home/[place] ?[facility] Provider location: [facility] [employer] [facility] ? ?All persons participating in visit: Monica Vazquez and [facility] Trevante Tennell  ? ?Types of Service: Individual psychotherapy and Telephone visit ? ?I connected with Phil Dopp and/or Koni D Devins's  n/a  via  Telephone or Video Enabled Telemedicine Application  (Video is Caregility application) and verified that I am speaking with the correct person using two identifiers. Discussed confidentiality: Yes  ? ?I discussed the limitations of telemedicine and the availability of in person appointments.  Discussed there is a possibility of technology failure and discussed alternative modes of communication if that failure occurs. ? ?I discussed that engaging in this telemedicine visit, they consent to the provision of behavioral healthcare and the services will be billed under their insurance. ? ?Monica and/or legal guardian expressed understanding and consented to Telemedicine visit: Yes  ? ?Presenting Concerns: ?Monica and/or family reports the following symptoms/concerns: Worry about dad (upcoming risky surgery; declining health), son (difficult adjusting to his brief time away with grandma), fiance (recent incarceration); temporarily staying with a friend after being kicked out of dad's; feels least anxiety when son is home.  ?Duration of problem: Ongoing; Severity of problem:  moderately severe ? ?Monica and/or Family's Strengths/Protective Factors: ?Sense of purpose ? ?Goals Addressed: ?Monica will: ? Reduce symptoms of: anxiety, depression, and stress  ? ?Progress  towards Goals: ?Ongoing ? ?Interventions: ?Interventions utilized:  Supportive Reflection ?Standardized Assessments completed: Not Needed ? ?Monica and/or Family Response: Monica agrees with treatment plan. ? ? ? ?Assessment: ?Monica currently experiencing PTSD, ADHD, OUD, Psychosocial stress.  ? ?Monica may benefit from continued brief therapeutic interventions. ? ?Plan: ?Follow up with behavioral health clinician on : Two weeks ?Behavioral recommendations:  ?-Continue taking Suboxone as prescribed; attend upcoming medical appointment on 03/[facility] at [facility] ?-Continue plan to discuss restarting ADHD medication with PCP ?-Prioritize time with son daily ?-Consider setting up phone voicemail within next two weeks ?Referral(s): Integrated [employer] (In Clinic) ? ?I discussed the assessment and treatment plan with the Monica and/or parent/guardian. They were provided an opportunity to ask questions and all were answered. They agreed with the plan and demonstrated an understanding of the instructions. ?  ?They were advised to call back or seek an in-person evaluation if the symptoms worsen or if the condition fails to improve as anticipated. ? ?Rae Lips, LCSW ? ? ?  02/13/2022  ?  5:06 PM 11/14/2021  ?  4:31 PM 08/24/2021  ?  1:51 PM 07/25/2021  ? 10:38 AM 05/30/2021  ? 11:36 AM  ?Depression screen PHQ 2/9  ?Decreased Interest 0 0 0 1 1  ?Down, Depressed, Hopeless 1 0 0  1  ?PHQ - 2 Score 1 0 0 1 2  ?Altered sleeping 0 0 0 1 0  ?Tired, decreased energy 0 0 1 1 1   ?Change in appetite 0 0 0 1 0  ?Feeling bad or failure about yourself  0 0 0 0 0  ?Trouble concentrating 0 0 0 0 0  ?Moving slowly or fidgety/restless 0 0 0 0 0  ?Suicidal thoughts 0 0 0 0 0  ?PHQ-9 Score 1 0 1  4 3  ?Difficult doing work/chores Not difficult at all      ? ? ?  02/13/2022  ?  5:06 PM 11/14/2021  ?  4:31 PM 08/24/2021  ?  1:52 PM 07/25/2021  ? 10:39 AM  ?GAD 7 : Generalized Anxiety Score  ?Nervous, Anxious, on  Edge 1 0 0 1  ?Control/stop worrying 0 0 0 0  ?Worry too much - different things 0 0 0 1  ?Trouble relaxing 0 0 1 0  ?Restless 0 0 0 0  ?Easily annoyed or irritable 0 0 1 1  ?Afraid - awful might happen 0 0 0 0  ?Total GAD 7 Score 1 0 2 3  ?Anxiety Difficulty Not difficult at all     ? ? ? ?

## 2022-03-12 ENCOUNTER — Ambulatory Visit: Payer: Medicaid Other | Admitting: Clinical

## 2022-03-12 DIAGNOSIS — F4310 Post-traumatic stress disorder, unspecified: Secondary | ICD-10-CM

## 2022-03-12 DIAGNOSIS — F909 Attention-deficit hyperactivity disorder, unspecified type: Secondary | ICD-10-CM

## 2022-03-12 DIAGNOSIS — Z658 Other specified problems related to psychosocial circumstances: Secondary | ICD-10-CM

## 2022-03-12 DIAGNOSIS — F1121 Opioid dependence, in remission: Secondary | ICD-10-CM

## 2022-03-13 NOTE — BH Specialist Note (Signed)
Pt did not arrive to video visit and did not answer the phone; Unable to leave voice message; left MyChart message for patient.   

## 2022-03-20 ENCOUNTER — Telehealth (INDEPENDENT_AMBULATORY_CARE_PROVIDER_SITE_OTHER): Payer: Medicaid Other | Admitting: Family Medicine

## 2022-03-20 DIAGNOSIS — F1121 Opioid dependence, in remission: Secondary | ICD-10-CM

## 2022-03-20 MED ORDER — SUBOXONE 8-2 MG SL FILM: 1.0000 | ORAL_FILM | Freq: Three times a day (TID) | SUBLINGUAL | 0 refills | Status: DC

## 2022-03-20 NOTE — Progress Notes (Signed)
? ?  TELEHEALTH GYNECOLOGY VISIT ENCOUNTER NOTE ? ?Provider location: [facility] [employer] [facility] [facility]  ? ?Patient location: Home ? ?I connected with Monica Vazquez on 03/20/22 at  3:35 PM EDT by telephone and verified that I am speaking with the correct person using two identifiers. Patient was unable to do MyChart audiovisual encounter due to technical difficulties, she tried several times.  ?  ?I discussed the limitations, risks, security and privacy concerns of performing an evaluation and management service by telephone and the availability of in person appointments. I also discussed with the patient that there may be a patient responsible charge related to this service. The patient expressed understanding and agreed to proceed. ?  ?History:  ?Monica Vazquez is a 26 y.o. G66P1011 female being evaluated today for suboxone follow up in the fourth trimester.  ? ?Last seen 02/13/2022, at that time endorsed significant upheaval in her personal life and some lapses in illicit substance use ?Increased to 8 mg TID at that time ?UDS was sent and was mostly appropriate with bup to norbup ratio indicative of only recently restarting suboxone ? ?Today reports doing well, taking suboxone as prescribed ?No illicits  ?  ?Past Medical History:  ?Diagnosis Date  ? ADHD (attention deficit hyperactivity disorder)   ? 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 Claustrophobia   ? ?Past Surgical History:  ?Procedure Laterality Date  ? implanon    ? TYMPANOMASTOIDECTOMY Right 09/12/2015  ? Procedure: RIGHT MODIFIED RADICAL TYMPANOMASTOIDECTOMY;  Surgeon: Newman Pies, MD;  Location: [facility];  Service: ENT;  Laterality: Right;  ? WISDOM TOOTH EXTRACTION    ? ?The following portions of the patient's history were reviewed and updated as appropriate: allergies, current medications, past family history, past medical history, past social history, past surgical history and problem list.  ? ?Review of Systems:   ?Pertinent items noted in HPI and remainder of comprehensive ROS otherwise negative. ? ?Physical Exam:  ? ?General:  Alert, oriented and cooperative.   ?Mental Status: Normal mood and affect perceived. Normal judgment and thought content.  ?Physical exam deferred due to nature of the encounter ? ?Labs and Imaging ?No results found for this or any previous visit (from the past 336 hour(s)). ?No results found.    ?Assessment and Plan:  ?   ?1. Opioid use disorder, moderate, in sustained remission (HCC) ?Refill sent ?Will need UDS at next visit, must be in person ? ?   ?  ?I discussed the assessment and treatment plan with the patient. The patient was provided an opportunity to ask questions and all were answered. The patient agreed with the plan and demonstrated an understanding of the instructions. ?  ?The patient was advised to call back or seek an in-person evaluation/go to the ED if the symptoms worsen or if the condition fails to improve as anticipated. ? ?I provided 10 minutes of non-face-to-face time during this encounter. ? ? ?Monica Maples, MD ?Center for [facility], [facility] ? ?

## 2022-03-26 ENCOUNTER — Ambulatory Visit: Payer: Medicaid Other | Admitting: Clinical

## 2022-03-26 DIAGNOSIS — Z91199 Patient's noncompliance with other medical treatment and regimen due to unspecified reason: Secondary | ICD-10-CM

## 2022-04-11 ENCOUNTER — Ambulatory Visit: Payer: Medicaid Other | Admitting: Family Medicine

## 2022-04-12 ENCOUNTER — Telehealth: Payer: Self-pay | Admitting: Family Medicine

## 2022-04-12 NOTE — Telephone Encounter (Signed)
Patient would like a call from clinical staff...did not specify why. ?

## 2022-04-12 NOTE — Telephone Encounter (Signed)
Returned patients call. Patient reports she is in a bind and has no transportation as her car is in the shop. She missed her appointment yesterday. She would like to schedule a virtual appointment, per Dr. Allen Derry note patient needs to be seen in person. Patient was informed of this.  ? ?Patient reports she is about out of medication and needs to be rescheduled with  Dr. Dione Plover ASAP. Will need to reach out to Dr. Dione Plover to ask about virtual appointment.  ? ?Reviewed with patient that Medicaid does provide transportation to appointments and she will need to contact them to schedule at least 2 days in advance of appointment. Patient voiced understanding.  ?

## 2022-04-13 ENCOUNTER — Telehealth: Payer: Self-pay | Admitting: Family Medicine

## 2022-04-13 NOTE — Telephone Encounter (Signed)
Called patient to inform of appointment, there was no answer to the phone call so a voicemail was left with the call back number for th office and a letter was mailed.  ?

## 2022-04-18 ENCOUNTER — Encounter: Payer: Self-pay | Admitting: Family Medicine

## 2022-04-18 ENCOUNTER — Ambulatory Visit (INDEPENDENT_AMBULATORY_CARE_PROVIDER_SITE_OTHER): Payer: Medicaid Other | Admitting: Family Medicine

## 2022-04-18 VITALS — BP 122/59 | HR 59 | Wt 178.3 lb

## 2022-04-18 DIAGNOSIS — F1120 Opioid dependence, uncomplicated: Secondary | ICD-10-CM | POA: Diagnosis not present

## 2022-04-18 DIAGNOSIS — O99320 Drug use complicating pregnancy, unspecified trimester: Secondary | ICD-10-CM

## 2022-04-18 DIAGNOSIS — F1121 Opioid dependence, in remission: Secondary | ICD-10-CM | POA: Diagnosis not present

## 2022-04-18 MED ORDER — SUBOXONE 8-2 MG SL FILM: 1.0000 | ORAL_FILM | Freq: Three times a day (TID) | SUBLINGUAL | 0 refills | Status: DC

## 2022-04-18 NOTE — Progress Notes (Signed)
3, 8mg  films a day. No issues with this dosing.  No other issues or concerns at this time  Altha Harm, [place]

## 2022-04-18 NOTE — Progress Notes (Signed)
   GYNECOLOGY OFFICE VISIT NOTE  History:   Monica Vazquez is a 26 y.o. G2P1011 here today for suboxone follow up.  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   Health Maintenance:   Last pap: Lab Results  Component Value Date   DIAGPAP (A) 11/11/2020    - Atypical squamous cells of undetermined significance (ASC-US)   HPVHIGH Negative 11/11/2020    Last mammogram:  N/a    Review of Systems:  Pertinent items noted in HPI and remainder of comprehensive ROS otherwise negative.  Physical Exam:  BP (!) 122/59   Pulse (!) 59   Wt 178 lb 4.8 oz (80.9 kg)   LMP 04/04/2022 (Approximate)   Breastfeeding No   BMI 27.93 kg/m  CONSTITUTIONAL: Well-developed, well-nourished female in no acute distress.  HEENT:  Normocephalic, atraumatic. External right and left ear normal. No scleral icterus.  NECK: Normal range of motion, supple, no  masses noted on observation SKIN: No rash noted. Not diaphoretic. No erythema. No pallor. MUSCULOSKELETAL: Normal range of motion. No edema noted. NEUROLOGIC: Alert and oriented to person, place, and time. Normal muscle tone coordination.  PSYCHIATRIC: Normal mood and affect. Normal behavior. Normal judgment and thought content. RESPIRATORY: Effort normal, no problems with respiration noted   Labs and Imaging No results found for this or any previous visit (from the past 168 hour(s)). No results found.    Assessment and Plan:   Problem List Items Addressed This Visit       Other   Suboxone maintenance treatment complicating pregnancy, antepartum (HCC) - Primary   Relevant Medications   SUBOXONE 8-2 MG FILM   Other Relevant Orders   ToxASSURE Select 13 (MW), Urine   Opioid use disorder, moderate, in sustained remission (HCC)    Doing well UDS today Refill sent       Relevant Medications   SUBOXONE 8-2 MG FILM    Routine preventative health maintenance measures emphasized. Please refer to After Visit Summary for other counseling recommendations.   Return in about 4 weeks (around 05/16/2022) for suboxone follow up.      Total face-to-face time with patient: 15 minutes.  Over 50% of encounter was spent on counseling and coordination of care.   Venora Maples, MD/MPH Attending Family Medicine Physician, [facility] for [facility], [facility]

## 2022-04-19 NOTE — Assessment & Plan Note (Signed)
Doing well UDS today Refill sent

## 2022-04-24 LAB — TOXASSURE SELECT 13 (MW), URINE

## 2022-04-26 ENCOUNTER — Telehealth: Payer: Self-pay

## 2022-04-26 NOTE — Telephone Encounter (Signed)
-----   Message from Venora Maples, MD sent at 04/26/2022 12:12 PM EDT ----- Please call patient and let her know I would like to see her ASAP in person to discuss her recent UDS result Results show cocaine as well as metabolite levels of Suboxone that are not consistent with taking it regularly OK to overbook if needed

## 2022-04-26 NOTE — Telephone Encounter (Signed)
Call placed to pt. No answer. Pt left message to return call to office ASAP to see Dr Crissie Reese for results of UDS.  Will attempt to call back on Friday.  Laney Pastor

## 2022-05-01 ENCOUNTER — Telehealth: Payer: Self-pay | Admitting: Family Medicine

## 2022-05-01 NOTE — Telephone Encounter (Signed)
Called pt; VM left stating I am calling to set pt up for appt to see Dr. Crissie Vazquez to discuss results. MyChart message sent.

## 2022-05-01 NOTE — Telephone Encounter (Signed)
Called patient to inform of appointment time change, there was no answer to the phone call so a voicemail was left and a letter was mailed.

## 2022-05-15 ENCOUNTER — Ambulatory Visit (INDEPENDENT_AMBULATORY_CARE_PROVIDER_SITE_OTHER): Payer: Medicaid Other | Admitting: Family Medicine

## 2022-05-15 ENCOUNTER — Ambulatory Visit: Payer: Medicaid Other | Admitting: Family Medicine

## 2022-05-15 ENCOUNTER — Encounter: Payer: Self-pay | Admitting: Family Medicine

## 2022-05-15 VITALS — BP 111/47 | HR 89 | Wt 173.3 lb

## 2022-05-15 DIAGNOSIS — O99320 Drug use complicating pregnancy, unspecified trimester: Secondary | ICD-10-CM

## 2022-05-15 DIAGNOSIS — F1120 Opioid dependence, uncomplicated: Secondary | ICD-10-CM | POA: Diagnosis not present

## 2022-05-15 DIAGNOSIS — F1121 Opioid dependence, in remission: Secondary | ICD-10-CM

## 2022-05-15 MED ORDER — SUBOXONE 8-2 MG SL FILM: 1.0000 | ORAL_FILM | Freq: Three times a day (TID) | SUBLINGUAL | 0 refills | Status: DC

## 2022-05-15 NOTE — Assessment & Plan Note (Signed)
Thanked patient for being honest with me, discussed I can only continue to prescribe buprenorphine if she is compliant with therapy. UDS today, refill sent.

## 2022-05-15 NOTE — Progress Notes (Signed)
GYNECOLOGY OFFICE VISIT NOTE  History:   Monica Vazquez is a 26 y.o. G2P1011 here today for suboxone follow up.  Patient last seen one month prior on 04/18/2022 UDS obtained at last visit showed cocaine and abnormal bup/norbup ratio suggestive of irregular use of medication  Today acknowledges she had used some cocaine but it was a few weeks before last UDS, not since Attributes bup metabolic ratio to inability to get enough of her medication in time due to missing some appointments Has moved back in with her dad in [place] feels more stable  Health Maintenance Due  Topic Date Due   COVID-19 Vaccine (1) Never done   HPV VACCINES (1 - 2-dose series) Never done   CHLAMYDIA SCREENING  04/07/2022    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   Claustrophobia     Past Surgical History:  Procedure Laterality Date   implanon     TYMPANOMASTOIDECTOMY Right 09/12/2015   Procedure: RIGHT MODIFIED RADICAL TYMPANOMASTOIDECTOMY;  Surgeon: Newman Pies, MD;  Location: [facility];  Service: ENT;  Laterality: Right;   WISDOM TOOTH EXTRACTION      The following portions of the patient's history were reviewed and updated as appropriate: allergies, current medications, past family history, past medical history, past social history, past surgical history and problem list.   Health Maintenance:   Last pap: Lab Results  Component Value Date   DIAGPAP (A) 11/11/2020    - Atypical squamous cells of undetermined significance (ASC-US)   HPVHIGH Negative 11/11/2020     Last mammogram:  N/a    Review of Systems:  Pertinent items noted in HPI and remainder of comprehensive ROS otherwise negative.  Physical Exam:  BP (!) 111/47   Pulse 89   Wt 173 lb 4.8 oz (78.6 kg)   LMP 05/04/2022 (Exact Date)   Breastfeeding No   BMI 27.14 kg/m  CONSTITUTIONAL: Well-developed, well-nourished  female in no acute distress.  HEENT:  Normocephalic, atraumatic. External right and left ear normal. No scleral icterus.  NECK: Normal range of motion, supple, no masses noted on observation SKIN: No rash noted. Not diaphoretic. No erythema. No pallor. MUSCULOSKELETAL: Normal range of motion. No edema noted. NEUROLOGIC: Alert and oriented to person, place, and time. Normal muscle tone coordination.  PSYCHIATRIC: Normal mood and affect. Normal behavior. Normal judgment and thought content. RESPIRATORY: Effort normal, no problems with respiration noted   Labs and Imaging No results found for this or any previous visit (from the past 168 hour(s)). No results found.    Assessment and Plan:   Problem List Items Addressed This Visit       Other   Suboxone maintenance treatment complicating pregnancy, antepartum (HCC) - Primary   Relevant Medications   SUBOXONE 8-2 MG FILM   Other Relevant Orders   ToxASSURE Select 13 (MW), Urine   Opioid use disorder, moderate, in sustained remission [facility])    Thanked patient for being honest with me, discussed I can only continue to prescribe buprenorphine if she is compliant with therapy. UDS today, refill sent.       Relevant Medications   SUBOXONE 8-2 MG FILM    Routine preventative health maintenance measures emphasized. Please refer to After Visit Summary for other counseling recommendations.   Return in about 4 weeks (around 06/12/2022) for suboxone follow up.      Total face-to-face time with patient: 18  minutes.  Over 50% of encounter was spent on counseling and coordination of care.   Venora Maples, MD/MPH Attending Family Medicine Physician, [facility] for [facility], [facility]

## 2022-05-16 ENCOUNTER — Encounter: Payer: Self-pay | Admitting: Family Medicine

## 2022-05-19 LAB — TOXASSURE SELECT 13 (MW), URINE

## 2022-06-08 ENCOUNTER — Ambulatory Visit (INDEPENDENT_AMBULATORY_CARE_PROVIDER_SITE_OTHER): Payer: Medicaid Other | Admitting: Family Medicine

## 2022-06-08 ENCOUNTER — Encounter: Payer: Self-pay | Admitting: Family Medicine

## 2022-06-08 ENCOUNTER — Other Ambulatory Visit: Payer: Self-pay

## 2022-06-08 VITALS — BP 128/72 | HR 81 | Ht 67.0 in | Wt 173.0 lb

## 2022-06-08 DIAGNOSIS — O99321 Drug use complicating pregnancy, first trimester: Secondary | ICD-10-CM | POA: Diagnosis not present

## 2022-06-08 DIAGNOSIS — F1121 Opioid dependence, in remission: Secondary | ICD-10-CM

## 2022-06-08 DIAGNOSIS — F1410 Cocaine abuse, uncomplicated: Secondary | ICD-10-CM

## 2022-06-08 NOTE — Assessment & Plan Note (Signed)
Doing well UDS collected Refill sent See back in one month

## 2022-06-08 NOTE — Progress Notes (Signed)
   GYNECOLOGY OFFICE VISIT NOTE  History:   Monica Vazquez is a 26 y.o. G2P1011 here today for suboxone follow up.  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   Health Maintenance:   Last pap: Lab Results  Component Value Date   DIAGPAP (A) 11/11/2020    - Atypical squamous cells of undetermined significance (ASC-US)   HPVHIGH Negative 11/11/2020     Last mammogram:  N/a    Review of Systems:  Pertinent items noted in HPI and remainder of comprehensive ROS otherwise negative.  Physical Exam:  BP 128/72   Pulse 81   Ht 5\' 7"  (1.702 m)   Wt 173 lb (78.5 kg)   LMP 05/04/2022 (Exact Date)   BMI 27.10 kg/m  CONSTITUTIONAL: Well-developed, well-nourished female in no acute distress.  HEENT:  Normocephalic, atraumatic. External right and left ear normal. No scleral icterus.  NECK: Normal range of motion, supple, no masses noted on observation SKIN: No rash noted. Not diaphoretic. No erythema. No pallor. MUSCULOSKELETAL: Normal range of motion. No edema noted. NEUROLOGIC: Alert and oriented to person,  place, and time. Normal muscle tone coordination.  PSYCHIATRIC: Normal mood and affect. Normal behavior. Normal judgment and thought content. RESPIRATORY: Effort normal, no problems with respiration noted   Labs and Imaging No results found for this or any previous visit (from the past 168 hour(s)). No results found.    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 Please refer to After Visit Summary for other counseling recommendations.   Return in about 4 weeks (around 07/06/2022).      Total face-to-face time with patient: 20 minutes.  Over 50% of encounter was spent on counseling and coordination of care.   09/05/2022, MD/MPH Attending Family Medicine Physician, [facility] for [facility], [facility]

## 2022-06-12 ENCOUNTER — Telehealth: Payer: Self-pay | Admitting: Family Medicine

## 2022-06-12 ENCOUNTER — Other Ambulatory Visit: Payer: Self-pay | Admitting: Family Medicine

## 2022-06-12 DIAGNOSIS — F1121 Opioid dependence, in remission: Secondary | ICD-10-CM

## 2022-06-12 DIAGNOSIS — F1120 Opioid dependence, uncomplicated: Secondary | ICD-10-CM

## 2022-06-12 MED ORDER — SUBOXONE 8-2 MG SL FILM: 1.0000 | ORAL_FILM | Freq: Three times a day (TID) | SUBLINGUAL | 0 refills | Status: DC

## 2022-06-12 NOTE — Progress Notes (Signed)
Refill sent to pharmacy.   

## 2022-06-12 NOTE — Telephone Encounter (Signed)
Patient needs fill on Suboxone.

## 2022-06-12 NOTE — Telephone Encounter (Signed)
Refill on Suboxone.

## 2022-06-12 NOTE — Telephone Encounter (Signed)
Called patient and informed her of refill. Patient verbalized understanding.

## 2022-06-16 LAB — TOXASSURE SELECT 13 (MW), URINE

## 2022-07-03 ENCOUNTER — Ambulatory Visit (INDEPENDENT_AMBULATORY_CARE_PROVIDER_SITE_OTHER): Payer: Medicaid Other | Admitting: Family Medicine

## 2022-07-03 ENCOUNTER — Other Ambulatory Visit: Payer: Self-pay | Admitting: Family Medicine

## 2022-07-03 ENCOUNTER — Other Ambulatory Visit: Payer: Self-pay

## 2022-07-03 ENCOUNTER — Encounter: Payer: Self-pay | Admitting: Family Medicine

## 2022-07-03 VITALS — BP 125/72 | HR 80 | Wt 173.0 lb

## 2022-07-03 DIAGNOSIS — F1120 Opioid dependence, uncomplicated: Secondary | ICD-10-CM

## 2022-07-03 DIAGNOSIS — F1121 Opioid dependence, in remission: Secondary | ICD-10-CM | POA: Diagnosis not present

## 2022-07-03 NOTE — Progress Notes (Unsigned)
   GYNECOLOGY OFFICE VISIT NOTE  History:   Monica Vazquez is a 26 y.o. G2P1011 here today for OUD follow up.  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   Health Maintenance:   Last pap: Lab Results  Component Value Date   DIAGPAP (A) 11/11/2020    - Atypical squamous cells of undetermined significance (ASC-US)   HPVHIGH Negative 11/11/2020     Last mammogram:  N/a    Review of Systems:  Pertinent items noted in HPI and remainder of comprehensive ROS otherwise negative.  Physical Exam:  BP 125/72   Pulse 80   Wt 173 lb (78.5 kg)   BMI 27.10 kg/m  CONSTITUTIONAL: Well-developed, well-nourished female in no acute distress.  HEENT:  Normocephalic, atraumatic. External right and left ear normal. No scleral icterus.  NECK: Normal range of motion, supple, no masses noted on observation SKIN: No rash noted. Not diaphoretic. No erythema. No  pallor. MUSCULOSKELETAL: Normal range of motion. No edema noted. NEUROLOGIC: Alert and oriented to person, place, and time. Normal muscle tone coordination.  PSYCHIATRIC: Normal mood and affect. Normal behavior. Normal judgment and thought content. RESPIRATORY: Effort normal, no problems with respiration noted   Labs and Imaging No results found for this or any previous visit (from the past 168 hour(s)). No results found.    Assessment and Plan:   Problem List Items Addressed This Visit       Other   Suboxone maintenance treatment complicating pregnancy, antepartum (HCC)   Relevant Medications   SUBOXONE 8-2 MG FILM   Opioid use disorder, moderate, in sustained remission (HCC) - Primary    Doing well Refill sent UDS today      Relevant Medications   SUBOXONE 8-2 MG FILM   Other Relevant Orders   ToxASSURE Select 13 (MW), Urine    Routine preventative health maintenance measures emphasized. Please refer to After Visit Summary for other counseling recommendations.   Return in about 4 weeks (around 07/31/2022), or suboxone follow up.      Total face-to-face time with patient: 15 minutes.  Over 50% of encounter was spent on counseling and coordination of care.   Venora Maples, MD/MPH Attending Family Medicine Physician, [facility] for [facility], [facility]

## 2022-07-05 MED ORDER — SUBOXONE 8-2 MG SL FILM: 1.0000 | ORAL_FILM | Freq: Three times a day (TID) | SUBLINGUAL | 0 refills | Status: DC

## 2022-07-05 NOTE — Assessment & Plan Note (Signed)
Doing well Refill sent UDS today

## 2022-07-07 LAB — TOXASSURE SELECT 13 (MW), URINE

## 2022-08-08 ENCOUNTER — Encounter: Payer: Self-pay | Admitting: Family Medicine

## 2022-08-08 ENCOUNTER — Ambulatory Visit (INDEPENDENT_AMBULATORY_CARE_PROVIDER_SITE_OTHER): Payer: Medicaid Other | Admitting: Family Medicine

## 2022-08-08 VITALS — BP 106/56 | HR 106 | Wt 167.0 lb

## 2022-08-08 DIAGNOSIS — F1121 Opioid dependence, in remission: Secondary | ICD-10-CM | POA: Diagnosis not present

## 2022-08-08 DIAGNOSIS — O99320 Drug use complicating pregnancy, unspecified trimester: Secondary | ICD-10-CM | POA: Diagnosis not present

## 2022-08-08 DIAGNOSIS — F1120 Opioid dependence, uncomplicated: Secondary | ICD-10-CM | POA: Diagnosis not present

## 2022-08-08 MED ORDER — BUPRENORPHINE HCL-NALOXONE HCL 8-2 MG SL FILM: 1.0000 | ORAL_FILM | Freq: Three times a day (TID) | SUBLINGUAL | 0 refills | Status: DC
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
## 2022-08-10 ENCOUNTER — Encounter: Payer: Self-pay | Admitting: Family Medicine

## 2022-08-10 NOTE — Assessment & Plan Note (Signed)
Stable though feeling like she may need to go up Given struggles of the last few months would like to see UDS result first before increasing dose, she is OK with this plan UDS collected today Encouraged her to let her sister know our door is open, happy to see her anytime to help her when she feels ready to seek help

## 2022-08-11 LAB — TOXASSURE SELECT 13 (MW), URINE

## 2022-09-04 ENCOUNTER — Ambulatory Visit: Payer: Medicaid Other | Admitting: Family Medicine

## 2022-09-04 ENCOUNTER — Other Ambulatory Visit: Payer: Self-pay | Admitting: Family Medicine

## 2022-09-04 ENCOUNTER — Encounter: Payer: Self-pay | Admitting: Family Medicine

## 2022-09-04 DIAGNOSIS — F1121 Opioid dependence, in remission: Secondary | ICD-10-CM

## 2022-09-06 MED ORDER — BUPRENORPHINE HCL-NALOXONE HCL 8-2 MG SL FILM: 1.0000 | ORAL_FILM | Freq: Three times a day (TID) | SUBLINGUAL | 0 refills | Status: DC

## 2022-10-11 ENCOUNTER — Other Ambulatory Visit: Payer: Self-pay | Admitting: Family Medicine

## 2022-10-11 DIAGNOSIS — F1121 Opioid dependence, in remission: Secondary | ICD-10-CM

## 2022-10-11 MED ORDER — BUPRENORPHINE HCL-NALOXONE HCL 8-2 MG SL FILM: 1.0000 | ORAL_FILM | Freq: Three times a day (TID) | SUBLINGUAL | 0 refills | Status: DC

## 2022-10-11 NOTE — Progress Notes (Signed)
Short refill until next appt

## 2022-10-16 ENCOUNTER — Encounter: Payer: Self-pay | Admitting: Family Medicine

## 2022-10-16 ENCOUNTER — Ambulatory Visit (INDEPENDENT_AMBULATORY_CARE_PROVIDER_SITE_OTHER): Payer: Medicaid Other | Admitting: Family Medicine

## 2022-10-16 ENCOUNTER — Other Ambulatory Visit: Payer: Self-pay

## 2022-10-16 VITALS — BP 128/82 | HR 111 | Ht 67.0 in | Wt 166.3 lb

## 2022-10-16 DIAGNOSIS — Z0000 Encounter for general adult medical examination without abnormal findings: Secondary | ICD-10-CM

## 2022-10-16 DIAGNOSIS — F1121 Opioid dependence, in remission: Secondary | ICD-10-CM

## 2022-10-16 DIAGNOSIS — Z23 Encounter for immunization: Secondary | ICD-10-CM | POA: Diagnosis not present

## 2022-10-16 MED ORDER — BUPRENORPHINE HCL-NALOXONE HCL 8-2 MG SL FILM: ORAL_FILM | SUBLINGUAL | 0 refills | Status: DC

## 2022-10-16 NOTE — Assessment & Plan Note (Signed)
Increase by 1/2 film before bed, otherwise doing very well. UDS collected. Will see back in one month. If symptoms improved can space to q3 months.

## 2022-10-16 NOTE — Progress Notes (Signed)
GYNECOLOGY OFFICE VISIT NOTE  History:   Monica Vazquez is a 26 y.o. G2P1011 here today for OUD follow up.  Last seen in office 08/08/2022, having a very hard time due to her sister's SUD and having to care for her niece At that time we had discussed going up to four strips daily but did not do so  Today reports she is doing well Sister is a bit better Still wondering about going up on her dose Primarily end of day symptoms  Health Maintenance Due  Topic Date Due   COVID-19 Vaccine (1) Never done   HPV VACCINES (1 - 2-dose series) Never done   CHLAMYDIA SCREENING  04/07/2022   INFLUENZA VACCINE  07/03/2022    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   Claustrophobia     Past Surgical History:  Procedure Laterality Date   implanon     TYMPANOMASTOIDECTOMY Right 09/12/2015   Procedure: RIGHT MODIFIED RADICAL TYMPANOMASTOIDECTOMY;  Surgeon: Newman Pies, MD;  Location: [facility];  Service: ENT;  Laterality: Right;   WISDOM TOOTH EXTRACTION      The following portions of the patient's history were reviewed and updated as appropriate: allergies, current medications, past family history, past medical history, past social history, past surgical history and problem list.   Health Maintenance:   Last pap: Lab Results  Component Value Date   DIAGPAP (A) 11/11/2020    - Atypical squamous cells of undetermined significance (ASC-US)   HPVHIGH Negative 11/11/2020   Routine screening per ASCCP  Last mammogram:  N/a    Review of Systems:  Pertinent items noted in HPI and remainder of comprehensive ROS otherwise negative.  Physical Exam:  BP 128/82   Pulse (!) 111   Ht 5\' 7"  (1.702 m)   Wt 166 lb 4.8 oz (75.4 kg)   LMP 09/26/2022 (Approximate)   BMI 26.05 kg/m  CONSTITUTIONAL: Well-developed, well-nourished female in no acute distress.  HEENT:  Normocephalic, atraumatic.  External right and left ear normal. No scleral icterus.  NECK: Normal range of motion, supple, no masses noted on observation SKIN: No rash noted. Not diaphoretic. No erythema. No pallor. MUSCULOSKELETAL: Normal range of motion. No edema noted. NEUROLOGIC: Alert and oriented to person, place, and time. Normal muscle tone coordination.  PSYCHIATRIC: Normal mood and affect. Normal behavior. Normal judgment and thought content. RESPIRATORY: Effort normal, no problems with respiration noted   Labs and Imaging No results found for this or any previous visit (from the past 168 hour(s)). No results found.    Assessment and Plan:   Problem List Items Addressed This Visit       Other   Opioid use disorder, moderate, in sustained remission (HCC) - Primary    Increase by 1/2 film before bed, otherwise doing very well. UDS collected. Will see back in one month. If symptoms improved can space to q3 months.       Relevant Medications   Buprenorphine HCl-Naloxone HCl (SUBOXONE) 8-2 MG FILM   Other Relevant Orders   ToxASSURE Select 13 (MW), Urine   Other Visit Diagnoses     Well female exam without gynecological exam       Relevant Orders   Flu Vaccine QUAD 26mo+IM (Fluarix, Fluzone & Alfiuria Quad PF)       Routine preventative health maintenance measures emphasized. Please refer to After Visit Summary for other counseling recommendations.  Return in about 4 weeks (around 11/13/2022) for OUD f/u.    Total face-to-face time with patient: 15 minutes.  Over 50% of encounter was spent on counseling and coordination of care.   Venora Maples, MD/MPH Attending Family Medicine Physician, [facility] for [facility], [facility]

## 2022-10-19 LAB — TOXASSURE SELECT 13 (MW), URINE

## 2022-11-13 ENCOUNTER — Other Ambulatory Visit: Payer: Self-pay | Admitting: Family Medicine

## 2022-11-13 DIAGNOSIS — F1121 Opioid dependence, in remission: Secondary | ICD-10-CM

## 2022-11-13 MED ORDER — BUPRENORPHINE HCL-NALOXONE HCL 8-2 MG SL FILM: ORAL_FILM | SUBLINGUAL | 0 refills | Status: DC

## 2022-11-14 ENCOUNTER — Telehealth: Payer: Self-pay | Admitting: Family Medicine

## 2022-11-14 NOTE — Telephone Encounter (Signed)
Called patient to schedule follow up appointment or uds labs, there was no answer to the phone call so a voicemail was left with the call back number for the office.

## 2022-11-15 ENCOUNTER — Telehealth: Payer: Self-pay | Admitting: Family Medicine

## 2022-11-15 NOTE — Telephone Encounter (Signed)
Called patient to schedule an appointment with Dr. Crissie Reese or to do a lab appointment for a uds. There was no answer to the phone call so a voicemail was left with the call back number for the office. A Mychart message was also sent.

## 2022-11-16 ENCOUNTER — Telehealth: Payer: Medicaid Other | Admitting: Emergency Medicine

## 2022-11-16 DIAGNOSIS — R197 Diarrhea, unspecified: Secondary | ICD-10-CM

## 2022-11-21 NOTE — Progress Notes (Signed)
Pt did not respond to request for more information. Will close this EV.

## 2022-12-17 ENCOUNTER — Encounter: Payer: Self-pay | Admitting: Family Medicine

## 2022-12-17 ENCOUNTER — Telehealth: Payer: Self-pay | Admitting: Family Medicine

## 2022-12-17 DIAGNOSIS — F1121 Opioid dependence, in remission: Secondary | ICD-10-CM

## 2022-12-17 NOTE — Telephone Encounter (Signed)
Patient ran out of SUBOXONE yesterday and needs a refill.

## 2022-12-17 NOTE — Telephone Encounter (Signed)
Patient called in needing her suboxone refill because she is stating to detox she states.

## 2022-12-17 NOTE — Telephone Encounter (Signed)
Pt's request for Suboxone refill was forwarded to Dr. Dione Plover this morning.

## 2022-12-17 NOTE — Telephone Encounter (Signed)
Pt's request for Suboxone refill was forwarded to Dr. Dione Plover earlier this morning.

## 2022-12-18 MED ORDER — BUPRENORPHINE HCL-NALOXONE HCL 8-2 MG SL FILM: ORAL_FILM | SUBLINGUAL | 0 refills | Status: DC

## 2022-12-27 ENCOUNTER — Ambulatory Visit (INDEPENDENT_AMBULATORY_CARE_PROVIDER_SITE_OTHER): Payer: Medicaid Other | Admitting: Family Medicine

## 2022-12-27 ENCOUNTER — Encounter: Payer: Self-pay | Admitting: Family Medicine

## 2022-12-27 ENCOUNTER — Other Ambulatory Visit: Payer: Self-pay

## 2022-12-27 VITALS — BP 111/70 | HR 84 | Wt 171.0 lb

## 2022-12-27 DIAGNOSIS — F419 Anxiety disorder, unspecified: Secondary | ICD-10-CM

## 2022-12-27 DIAGNOSIS — F1121 Opioid dependence, in remission: Secondary | ICD-10-CM

## 2022-12-27 MED ORDER — HYDROXYZINE HCL 10 MG PO TABS: 10.0000 mg | ORAL_TABLET | Freq: Three times a day (TID) | ORAL | 5 refills | Status: DC | PRN

## 2022-12-27 MED ORDER — SERTRALINE HCL 50 MG PO TABS: 50.0000 mg | ORAL_TABLET | Freq: Every day | ORAL | 5 refills | Status: DC

## 2022-12-27 NOTE — Progress Notes (Signed)
GYNECOLOGY OFFICE VISIT NOTE  History:   Monica Vazquez is a 27 y.o. G2P1011 here today for OUD follow up.  Patient reports a lot of stress Dad has been sick, getting bone marrow biopsy Getting platelets and blood transfusions regularly She is most stressed about being on felony probation for the next 18 months for a theft She is very worried about that and implications for her family Endorses some recent relapses with pain pills but reports she is still taking her suboxone as prescribed Anxiety has been very high and she would like some help  Health Maintenance Due  Topic Date Due   COVID-19 Vaccine (1) Never done   HPV VACCINES (1 - 2-dose series) Never done   CHLAMYDIA SCREENING  04/07/2022    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   Claustrophobia     Past Surgical History:  Procedure Laterality Date   implanon     TYMPANOMASTOIDECTOMY Right 09/12/2015   Procedure: RIGHT MODIFIED RADICAL TYMPANOMASTOIDECTOMY;  Surgeon: Leta Baptist, MD;  Location: [facility];  Service: ENT;  Laterality: Right;   WISDOM TOOTH EXTRACTION      The following portions of the patient's history were reviewed and updated as appropriate: allergies, current medications, past family history, past medical history, past social history, past surgical history and problem list.   Health Maintenance:   Last pap: Lab Results  Component Value Date   DIAGPAP (A) 11/11/2020    - Atypical squamous cells of undetermined significance (ASC-US)   HPVHIGH Negative 11/11/2020    Last mammogram:  N/a    Review of Systems:  Pertinent items noted in HPI and remainder of comprehensive ROS otherwise negative.  Physical Exam:  BP 111/70   Pulse 84   Wt 171 lb (77.6 kg)   BMI 26.78 kg/m  CONSTITUTIONAL: Well-developed, well-nourished female in no acute distress.  HEENT:  Normocephalic, atraumatic.  External right and left ear normal. No scleral icterus.  NECK: Normal range of motion, supple, no masses noted on observation SKIN: No rash noted. Not diaphoretic. No erythema. No pallor. MUSCULOSKELETAL: Normal range of motion. No edema noted. NEUROLOGIC: Alert and oriented to person, place, and time. Normal muscle tone coordination.  PSYCHIATRIC: Normal mood and affect. Normal behavior. Normal judgment and thought content. RESPIRATORY: Effort normal, no problems with respiration noted   Labs and Imaging No results found for this or any previous visit (from the past 168 hour(s)). No results found.    Assessment and Plan:   Problem List Items Addressed This Visit       Other   Anxiety    Significant life stressors. Has seen Roselyn Reef in the past and is interested in doing so again, referral made. Also would like to trial meds, rx sent for zoloft and vistaril.       Relevant Medications   sertraline (ZOLOFT) 50 MG tablet   hydrOXYzine (ATARAX) 10 MG tablet   Other Relevant Orders   Ambulatory referral to [place]   Opioid use disorder, moderate, in sustained remission ([place]) - Primary    Initially gave urine that was ice cold. Had frank discussion with her that we need to be open and honest with each other. I understand why she did this given her current situation and we discussed limited testing of suboxone only so that I can properly monitor her therapy, she was in agreement with this plan.  In the future said we can talk things through. She gave a repeat sample. Not yet due for suboxone refill.       Relevant Orders   Buprenorphine, Urine    Routine preventative health maintenance measures emphasized. Please refer to After Visit Summary for other counseling recommendations.   Return in about 4 weeks (around 01/24/2023) for OUD f/u.    Total face-to-face time with patient: 20 minutes.  Over 50% of encounter was spent on counseling and coordination of  care.   Clarnce Flock, MD/MPH Attending Family Medicine Physician, [facility] for [facility], [place]

## 2022-12-27 NOTE — Assessment & Plan Note (Signed)
Initially gave urine that was ice cold. Had frank discussion with her that we need to be open and honest with each other. I understand why she did this given her current situation and we discussed limited testing of suboxone only so that I can properly monitor her therapy, she was in agreement with this plan. In the future said we can talk things through. She gave a repeat sample. Not yet due for suboxone refill.

## 2022-12-27 NOTE — Assessment & Plan Note (Signed)
Significant life stressors. Has seen Roselyn Reef in the past and is interested in doing so again, referral made. Also would like to trial meds, rx sent for zoloft and vistaril.

## 2022-12-28 ENCOUNTER — Telehealth: Payer: Self-pay | Admitting: Clinical

## 2022-12-28 NOTE — Telephone Encounter (Signed)
Attempt call regarding referral; Left HIPPA-compliant message to call back Chaitanya Amedee from [facility] at [facility] at  [phone] (Tyresa Prindiville's office).    

## 2022-12-31 LAB — BUPRENORPHINE, URINE: Buprenorphine, Urine: POSITIVE — AB

## 2023-01-14 NOTE — BH Specialist Note (Signed)
Pt did not arrive to video visit and did not answer the phone; Pt's father answered the phone and Left HIPPA-compliant message to call back Roselyn Reef from [employer] for [employer] at [facility] at  [phone] [facility] office).  ; left MyChart message for patient.

## 2023-01-18 MED ORDER — BUPRENORPHINE HCL-NALOXONE HCL 8-2 MG SL FILM: ORAL_FILM | SUBLINGUAL | 0 refills | Status: DC

## 2023-01-18 NOTE — Addendum Note (Signed)
Addended by: Clayton Lefort on: 01/18/2023 10:38 AM   Modules accepted: Orders

## 2023-01-22 ENCOUNTER — Ambulatory Visit: Payer: Medicaid Other | Admitting: Family Medicine

## 2023-01-28 ENCOUNTER — Ambulatory Visit: Payer: Medicaid Other | Admitting: Clinical

## 2023-01-28 DIAGNOSIS — Z91199 Patient's noncompliance with other medical treatment and regimen due to unspecified reason: Secondary | ICD-10-CM

## 2023-01-29 ENCOUNTER — Encounter: Payer: Self-pay | Admitting: Family Medicine

## 2023-01-29 ENCOUNTER — Other Ambulatory Visit: Payer: Self-pay

## 2023-01-29 ENCOUNTER — Ambulatory Visit (INDEPENDENT_AMBULATORY_CARE_PROVIDER_SITE_OTHER): Payer: Medicaid Other | Admitting: Family Medicine

## 2023-01-29 VITALS — BP 124/54 | HR 57 | Ht 67.0 in | Wt 178.0 lb

## 2023-01-29 DIAGNOSIS — F1121 Opioid dependence, in remission: Secondary | ICD-10-CM

## 2023-01-29 DIAGNOSIS — F419 Anxiety disorder, unspecified: Secondary | ICD-10-CM

## 2023-01-29 NOTE — Assessment & Plan Note (Signed)
Stable Refill sent previously UDS today See back q month until we have a bit more stability

## 2023-01-29 NOTE — Assessment & Plan Note (Signed)
Plans to reschedule with Monica Vazquez D/c zoloft per patient Cont hydroxyzine prn

## 2023-01-29 NOTE — Progress Notes (Signed)
GYNECOLOGY OFFICE VISIT NOTE  History:   Monica Vazquez is a 27 y.o. G2P1011 here today for OUD follow up.  Patient with significant stress at last visit, father sick and also dealing with legal issues as well as a relapse UDS was appropriate Also accepted referral to Roselyn Reef  Today reports she is feeling better than last time Zoloft felt like it was making her too sleepy during the day so not taking But hydroxyzine is helping with anxiety, using it TID daily Reports compliant with suboxone, no relapses since last visit Going to [facility] as part of her legal issues  Health Maintenance Due  Topic Date Due   COVID-19 Vaccine (1) Never done   HPV VACCINES (1 - 2-dose series) Never done   CHLAMYDIA SCREENING  04/07/2022    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   Claustrophobia     Past Surgical History:  Procedure Laterality Date   implanon     TYMPANOMASTOIDECTOMY Right 09/12/2015   Procedure: RIGHT MODIFIED RADICAL TYMPANOMASTOIDECTOMY;  Surgeon: Leta Baptist, MD;  Location: [facility];  Service: ENT;  Laterality: Right;   WISDOM TOOTH EXTRACTION      The following portions of the patient's history were reviewed and updated as appropriate: allergies, current medications, past family history, past medical history, past social history, past surgical history and problem list.   Health Maintenance:   Last pap: Lab Results  Component Value Date   DIAGPAP (A) 11/11/2020    - Atypical squamous cells of undetermined significance (ASC-US)   [place] Negative 11/11/2020   Repeat pap 11/2023  Last mammogram:  N/a    Review of Systems:  Pertinent items noted in HPI and remainder of comprehensive ROS otherwise negative.  Physical Exam:  BP (!) 124/54   Pulse (!) 57   Ht '5\' 7"'$  (1.702 m)   Wt 178 lb (80.7 kg)   LMP 01/16/2023 (Exact Date)   Breastfeeding No   BMI 27.88  kg/m  CONSTITUTIONAL: Well-developed, well-nourished female in no acute distress.  HEENT:  Normocephalic, atraumatic. External right and left ear normal. No scleral icterus.  NECK: Normal range of motion, supple, no masses noted on observation SKIN: No rash noted. Not diaphoretic. No erythema. No pallor. MUSCULOSKELETAL: Normal range of motion. No edema noted. NEUROLOGIC: Alert and oriented to person, place, and time. Normal muscle tone coordination.  PSYCHIATRIC: Normal mood and affect. Normal behavior. Normal judgment and thought content. RESPIRATORY: Effort normal, no problems with respiration noted   Labs and Imaging No results found for this or any previous visit (from the past 168 hour(s)). No results found.    Assessment and Plan:   Problem List Items Addressed This Visit       Other   Anxiety    Plans to reschedule with Roselyn Reef D/c zoloft per patient Cont hydroxyzine prn      Opioid use disorder, moderate, in sustained remission ([place]) - Primary    Stable Refill sent previously UDS today See back q month until we have a bit more stability      Relevant Orders   ToxASSURE Select 13 (MW), Urine    Routine preventative health maintenance measures emphasized. Please refer to After Visit Summary for other counseling recommendations.   Return in about 4 weeks (around 02/26/2023) for OUD f/u.    Total face-to-face time with patient: 20 minutes.  Over 50% of encounter was spent  on counseling and coordination of care.   Clarnce Flock, MD/MPH Attending Family Medicine Physician, [facility] for [facility], [place]

## 2023-02-02 LAB — TOXASSURE SELECT 13 (MW), URINE

## 2023-02-14 MED ORDER — BUPRENORPHINE HCL-NALOXONE HCL 8-2 MG SL FILM: ORAL_FILM | SUBLINGUAL | 0 refills | Status: DC

## 2023-02-14 NOTE — Addendum Note (Signed)
Addended by: Clayton Lefort on: 02/14/2023 01:08 PM   Modules accepted: Orders

## 2023-02-21 ENCOUNTER — Ambulatory Visit: Payer: Medicaid Other | Admitting: Family Medicine

## 2023-03-15 ENCOUNTER — Other Ambulatory Visit: Payer: Self-pay | Admitting: Family Medicine

## 2023-03-15 DIAGNOSIS — F1121 Opioid dependence, in remission: Secondary | ICD-10-CM

## 2023-03-15 MED ORDER — BUPRENORPHINE HCL-NALOXONE HCL 8-2 MG SL FILM: ORAL_FILM | SUBLINGUAL | 0 refills | Status: DC

## 2023-03-22 DIAGNOSIS — F1113 Opioid abuse with withdrawal: Secondary | ICD-10-CM | POA: Diagnosis not present

## 2023-03-25 DIAGNOSIS — K0889 Other specified disorders of teeth and supporting structures: Secondary | ICD-10-CM | POA: Diagnosis not present

## 2023-03-25 DIAGNOSIS — F1113 Opioid abuse with withdrawal: Secondary | ICD-10-CM | POA: Diagnosis not present

## 2023-04-02 MED ORDER — BUPRENORPHINE HCL-NALOXONE HCL 8-2 MG SL FILM: ORAL_FILM | SUBLINGUAL | 0 refills | Status: DC

## 2023-04-02 NOTE — Addendum Note (Signed)
Addended by: Merian Capron on: 04/02/2023 10:41 AM   Modules accepted: Orders

## 2023-04-16 ENCOUNTER — Other Ambulatory Visit: Payer: Self-pay

## 2023-04-16 ENCOUNTER — Encounter: Payer: Self-pay | Admitting: Family Medicine

## 2023-04-16 ENCOUNTER — Ambulatory Visit (INDEPENDENT_AMBULATORY_CARE_PROVIDER_SITE_OTHER): Payer: Medicaid Other | Admitting: Family Medicine

## 2023-04-16 VITALS — BP 117/62 | HR 65 | Wt 168.4 lb

## 2023-04-16 DIAGNOSIS — F419 Anxiety disorder, unspecified: Secondary | ICD-10-CM | POA: Diagnosis not present

## 2023-04-16 DIAGNOSIS — F1121 Opioid dependence, in remission: Secondary | ICD-10-CM | POA: Diagnosis not present

## 2023-04-16 MED ORDER — BUPRENORPHINE HCL-NALOXONE HCL 8-2 MG SL FILM: ORAL_FILM | SUBLINGUAL | 0 refills | Status: DC

## 2023-04-16 MED ORDER — HYDROXYZINE HCL 10 MG PO TABS: 10.0000 mg | ORAL_TABLET | Freq: Three times a day (TID) | ORAL | 5 refills | Status: DC | PRN
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
## 2023-04-17 ENCOUNTER — Encounter: Payer: Self-pay | Admitting: Family Medicine

## 2023-04-17 NOTE — Assessment & Plan Note (Signed)
Hydroxyzine refill sent.

## 2023-04-17 NOTE — Assessment & Plan Note (Signed)
Doing well, refill sent, UDS collected See back in 1 month until more stable

## 2023-04-18 LAB — TOXASSURE FLEX 15, UR
6-ACETYLMORPHINE IA: NEGATIVE ng/mL
7-aminoclonazepam: NOT DETECTED ng/mg creat
AMPHETAMINES IA: NEGATIVE ng/mL
Alpha-hydroxyalprazolam: NOT DETECTED ng/mg creat
Alpha-hydroxymidazolam: NOT DETECTED ng/mg creat
Alpha-hydroxytriazolam: NOT DETECTED ng/mg creat
Alprazolam: NOT DETECTED ng/mg creat
BARBITURATES IA: NEGATIVE ng/mL
BUPRENORPHINE: POSITIVE
Benzodiazepines: NEGATIVE
Buprenorphine: >714 ng/mg creat
CANNABINOIDS IA: NEGATIVE ng/mL
COCAINE METABOLITE IA: NEGATIVE ng/mL
Clonazepam: NOT DETECTED ng/mg creat
Creatinine: 140 mg/dL
Desalkylflurazepam: NOT DETECTED ng/mg creat
Desmethyldiazepam: NOT DETECTED ng/mg creat
Desmethylflunitrazepam: NOT DETECTED ng/mg creat
Diazepam: NOT DETECTED ng/mg creat
ETHYL ALCOHOL Enzymatic: NEGATIVE g/dL
FENTANYL: NEGATIVE
Fentanyl: NOT DETECTED ng/mg creat
Flunitrazepam: NOT DETECTED ng/mg creat
Lorazepam: NOT DETECTED ng/mg creat
METHADONE IA: NEGATIVE ng/mL
METHADONE MTB IA: NEGATIVE ng/mL
Midazolam: NOT DETECTED ng/mg creat
Norbuprenorphine: 6 ng/mg creat
Norfentanyl: NOT DETECTED ng/mg creat
OPIATE CLASS IA: NEGATIVE ng/mL
OXYCODONE CLASS IA: NEGATIVE ng/mL
Oxazepam: NOT DETECTED ng/mg creat
PHENCYCLIDINE IA: NEGATIVE ng/mL
TAPENTADOL, IA: NEGATIVE ng/mL
TRAMADOL IA: NEGATIVE ng/mL
Temazepam: NOT DETECTED ng/mg creat

## 2023-04-24 ENCOUNTER — Encounter: Payer: Self-pay | Admitting: Emergency Medicine

## 2023-04-24 ENCOUNTER — Ambulatory Visit
Admission: EM | Admit: 2023-04-24 | Discharge: 2023-04-24 | Disposition: A | Discharge status: Home / Self Care | Payer: Medicaid Other | Attending: Physician Assistant | Admitting: Physician Assistant

## 2023-04-24 DIAGNOSIS — H6690 Otitis media, unspecified, unspecified ear: Secondary | ICD-10-CM | POA: Diagnosis not present

## 2023-04-24 MED ORDER — IBUPROFEN 800 MG PO TABS: 800.0000 mg | ORAL_TABLET | Freq: Three times a day (TID) | ORAL | 0 refills | Status: DC | PRN

## 2023-04-24 MED ORDER — AMOXICILLIN-POT CLAVULANATE 875-125 MG PO TABS: 1.0000 | ORAL_TABLET | Freq: Two times a day (BID) | ORAL | 0 refills | Status: DC

## 2023-04-24 NOTE — ED Provider Notes (Signed)
[facility]    CSN: [id number] Arrival date & time: 04/24/23  1924      History   Chief Complaint No chief complaint on file.   HPI Monica Vazquez is a 27 y.o. female.   Complains of drainage from her right ear.  Patient reports that she has had yellow drainage and some blood draining for the past 3 days.  Patient denies any fever or chills.  Patient has had ear surgery in the past by Dr. Jodean Lima.  Patient reports she had a surgical removal of a cholesteatoma.     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   Claustrophobia     Patient Active Problem List   Diagnosis Date Noted   Opioid use disorder, moderate, in sustained remission (HCC) 05/10/2021   Alpha thalassemia silent carrier 11/09/2020   Rubella non-immune status, antepartum 10/22/2020   Smoker 10/21/2020   Anxiety 10/21/2020    Past Surgical History:  Procedure Laterality Date   implanon     TYMPANOMASTOIDECTOMY Right 09/12/2015   Procedure: RIGHT MODIFIED RADICAL TYMPANOMASTOIDECTOMY;  Surgeon: Newman Pies, MD;  Location: [facility];  Service: ENT;  Laterality: Right;   WISDOM TOOTH EXTRACTION      OB History     Gravida  2   Para  1   Term  1   Preterm  0   AB  1   Living  1      SAB  1   IAB  0   Ectopic  0   Multiple  0   Live Births  1            Home Medications    Prior to Admission medications   Medication Sig Start Date End Date Taking? Authorizing Provider  amoxicillin-clavulanate (AUGMENTIN) 875-125 MG tablet Take 1 tablet by mouth 2 (two) times daily. 04/24/23  Yes Cheron Schaumann K, PA-C  ibuprofen (ADVIL) 800 MG tablet Take 1 tablet (800 mg total) by mouth every 8 (eight) hours as needed. 04/24/23  Yes Elson Areas, PA-C  Buprenorphine HCl-Naloxone HCl 8-2 MG FILM Take 1 film under the tongue at 8 AM, 2 PM, and 8 PM, and a 1/2 film at midnight 04/16/23    Venora Maples, MD  hydrOXYzine (ATARAX) 10 MG tablet Take 1 tablet (10 mg total) by mouth 3 (three) times daily as needed. 04/16/23   Venora Maples, MD  naloxone [facility]) nasal spray 4 mg/0.1 mL Use in case of overdose Patient not taking: Reported on 08/08/2022 11/11/20   Venora Maples, MD    Family History Family History  Problem Relation Age of Onset   Hypertension Father    Heart disease Father     Social History Social History   Tobacco Use   Smoking status: Every Day    Packs/day: 0.00    Years: 3.00    Additional pack years: 0.00    Total pack years: 0.00    Types: Cigarettes   Smokeless tobacco: Never   Tobacco comments:    smokes 3-4 cig daily  Vaping Use   Vaping Use: Former   Substances: Nicotine  Substance Use Topics   Alcohol use: No   Drug use: Yes    Types: Oxycodone, Cocaine, Marijuana    Comment: suboxone, THC a month ago, cocaine early pregnancy     Allergies   Hydrocodone   Review of  Systems Review of Systems  All other systems reviewed and are negative.    Physical Exam Triage Vital Signs ED Triage Vitals [04/24/23 1935]  Enc Vitals Group     BP 119/70     Pulse Rate 70     Resp 20     Temp 97.8 F (36.6 C)     Temp Source Oral     SpO2 98 %     Weight      Height      Head Circumference      Peak Flow      Pain Score 10     Pain Loc      Pain Edu?      Excl. in GC?    No data found.  Updated Vital Signs BP 119/70 (BP Location: Right Arm)   Pulse 70   Temp 97.8 F (36.6 C) (Oral)   Resp 20   LMP 04/22/2023 (Exact Date)   SpO2 98%   Visual Acuity Right Eye Distance:   Left Eye Distance:   Bilateral Distance:    Right Eye Near:   Left Eye Near:    Bilateral Near:     Physical Exam Vitals and nursing note reviewed.  Constitutional:      Appearance: She is well-developed.  HENT:     Head: Normocephalic.     Ears:     Comments: TM clear, right TM greenish-yellow exudative drainage, Eyes:      Pupils: Pupils are equal, round, and reactive to light.  Cardiovascular:     Rate and Rhythm: Normal rate.  Pulmonary:     Effort: Pulmonary effort is normal.  Abdominal:     General: There is no distension.  Musculoskeletal:        General: Normal range of motion.     Cervical back: Normal range of motion.  Skin:    General: Skin is warm.  Neurological:     General: No focal deficit present.     Mental Status: She is alert and oriented to person, place, and time.      UC Treatments / Results  Labs (all labs ordered are listed, but only abnormal results are displayed) Labs Reviewed - No data to display  EKG   Radiology No results found.  Procedures Procedures (including critical care time)  Medications Ordered in UC Medications - No data to display  Initial Impression / Assessment and Plan / UC Course  I have reviewed the triage vital signs and the nursing notes.  Pertinent labs & imaging results that were available during my care of the patient were reviewed by me and considered in my medical decision making (see chart for details).      Final Clinical Impressions(s) / UC Diagnoses   Final diagnoses:  Acute otitis media, unspecified otitis media type     Discharge Instructions      See Dr. Suszanne Conners for recheck next week    ED Prescriptions     Medication Sig Dispense Auth. Provider   amoxicillin-clavulanate (AUGMENTIN) 875-125 MG tablet Take 1 tablet by mouth 2 (two) times daily. 20 tablet Taye Cato K, [place]   ibuprofen (ADVIL) 800 MG tablet Take 1 tablet (800 mg total) by mouth every 8 (eight) hours as needed. 30 tablet Elson Areas, [place]      PDMP not reviewed this encounter. An After Visit Summary was printed and given to the patient.       Elson Areas, [place] 04/24/23 1956

## 2023-04-24 NOTE — ED Triage Notes (Signed)
Right ear pain x 3 to 4 days.  States ear has been leaking

## 2023-04-24 NOTE — Discharge Instructions (Signed)
See Dr. Suszanne Conners for recheck next week

## 2023-04-30 DIAGNOSIS — H6691 Otitis media, unspecified, right ear: Secondary | ICD-10-CM | POA: Diagnosis not present

## 2023-04-30 DIAGNOSIS — R030 Elevated blood-pressure reading, without diagnosis of hypertension: Secondary | ICD-10-CM | POA: Diagnosis not present

## 2023-04-30 DIAGNOSIS — H6091 Unspecified otitis externa, right ear: Secondary | ICD-10-CM | POA: Diagnosis not present

## 2023-05-14 ENCOUNTER — Encounter: Payer: Self-pay | Admitting: Family Medicine

## 2023-05-14 ENCOUNTER — Other Ambulatory Visit: Payer: Self-pay

## 2023-05-14 ENCOUNTER — Ambulatory Visit (INDEPENDENT_AMBULATORY_CARE_PROVIDER_SITE_OTHER): Payer: Medicaid Other | Admitting: Family Medicine

## 2023-05-14 VITALS — BP 132/76 | HR 88 | Ht 67.0 in | Wt 171.2 lb

## 2023-05-14 DIAGNOSIS — F1121 Opioid dependence, in remission: Secondary | ICD-10-CM

## 2023-05-14 MED ORDER — BUPRENORPHINE HCL-NALOXONE HCL 8-2 MG SL FILM
ORAL_FILM | SUBLINGUAL | 0 refills | Status: DC
Start: 2023-05-14 — End: 2023-06-11

## 2023-05-14 NOTE — Assessment & Plan Note (Signed)
Mostly stable though taking a bit more due to cravings and emotional distress the last few days Discussed possibly going up to 8 mg QID but she'd prefer to stay at current dose for now and see how she feels once things have settled down a bit UDS today Refill sent

## 2023-05-14 NOTE — Progress Notes (Signed)
GYNECOLOGY OFFICE VISIT NOTE  History:   Monica Vazquez is a 27 y.o. G2P1011 here today for OUD follow up.  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   Health Maintenance:   Last pap: Lab Results  Component Value Date   DIAGPAP (A) 11/11/2020    - Atypical squamous cells of undetermined significance (ASC-US)   HPVHIGH Negative 11/11/2020   Needs repeat 11/2023  Last mammogram:  N/a    Review of Systems:  Pertinent items noted in HPI and remainder of comprehensive ROS otherwise negative.  Physical Exam:  BP 132/76   Pulse 88   Ht 5\' 7"  (1.702 m)   Wt 171 lb 3.2 oz (77.7 kg)   LMP 04/22/2023 (Exact Date)   BMI 26.81 kg/m  CONSTITUTIONAL: Well-developed, well-nourished female in no acute distress.  HEENT:  Normocephalic, atraumatic. External right and left ear normal. No scleral icterus.  NECK: Normal range of motion, supple, no masses noted on observation SKIN: No rash noted. Not  diaphoretic. No erythema. No pallor. MUSCULOSKELETAL: Normal range of motion. No edema noted. NEUROLOGIC: Alert and oriented to person, place, and time. Normal muscle tone coordination.  PSYCHIATRIC: Normal mood and affect. Normal behavior. Normal judgment and thought content. RESPIRATORY: Effort normal, no problems with respiration noted   Labs and Imaging No results found for this or any previous visit (from the past 168 hour(s)). No results found.    Assessment and Plan:   Problem List Items Addressed This Visit       Other   Opioid use disorder, moderate, in sustained remission (HCC) - Primary    Mostly stable though taking a bit more due to cravings and emotional distress the last few days Discussed possibly going up to 8 mg QID but she'd prefer to stay at current dose for now and see how she feels once things have settled down a bit UDS today Refill sent      Relevant Medications   Buprenorphine HCl-Naloxone HCl 8-2 MG FILM   Other Relevant Orders   ToxAssure Flex 15, Ur    Routine preventative health maintenance measures emphasized. Please refer to After Visit Summary for other counseling recommendations.   Return in about 4 weeks (around 06/11/2023) for OUD f/u.    Total face-to-face time with patient: 15 minutes.  Over 50% of encounter was spent on counseling and coordination of care.   Venora Maples, MD/MPH Attending Family Medicine Physician, [facility] for [facility], [facility]  Group

## 2023-05-18 LAB — TOXASSURE FLEX 15, UR
6-ACETYLMORPHINE IA: NEGATIVE ng/mL
7-aminoclonazepam: NOT DETECTED ng/mg creat
AMPHETAMINES IA: NEGATIVE ng/mL
Alpha-hydroxyalprazolam: NOT DETECTED ng/mg creat
Alpha-hydroxymidazolam: NOT DETECTED ng/mg creat
Alpha-hydroxytriazolam: NOT DETECTED ng/mg creat
Alprazolam: NOT DETECTED ng/mg creat
BARBITURATES IA: NEGATIVE ng/mL
BUPRENORPHINE: POSITIVE
Benzodiazepines: NEGATIVE
Buprenorphine: >355 ng/mg creat
COCAINE METABOLITE IA: NEGATIVE ng/mL
Clonazepam: NOT DETECTED ng/mg creat
Creatinine: 282 mg/dL
Desalkylflurazepam: NOT DETECTED ng/mg creat
Desmethyldiazepam: NOT DETECTED ng/mg creat
Desmethylflunitrazepam: NOT DETECTED ng/mg creat
Diazepam: NOT DETECTED ng/mg creat
ETHYL ALCOHOL Enzymatic: NEGATIVE g/dL
FENTANYL: NEGATIVE
Fentanyl: NOT DETECTED ng/mg creat
Flunitrazepam: NOT DETECTED ng/mg creat
Lorazepam: NOT DETECTED ng/mg creat
METHADONE IA: NEGATIVE ng/mL
METHADONE MTB IA: NEGATIVE ng/mL
Midazolam: NOT DETECTED ng/mg creat
Norbuprenorphine: NOT DETECTED ng/mg creat
Norfentanyl: NOT DETECTED ng/mg creat
OPIATE CLASS IA: NEGATIVE ng/mL
OXYCODONE CLASS IA: NEGATIVE ng/mL
Oxazepam: NOT DETECTED ng/mg creat
PHENCYCLIDINE IA: NEGATIVE ng/mL
TAPENTADOL, IA: NEGATIVE ng/mL
TRAMADOL IA: NEGATIVE ng/mL
Temazepam: NOT DETECTED ng/mg creat

## 2023-05-18 LAB — CANNABINOIDS, MS, UR RFX
Cannabinoids Confirmation: POSITIVE
Carboxy-THC: >355 ng/mg creat

## 2023-06-11 ENCOUNTER — Other Ambulatory Visit: Payer: Self-pay

## 2023-06-11 ENCOUNTER — Ambulatory Visit: Payer: MEDICAID | Admitting: Family Medicine

## 2023-06-11 ENCOUNTER — Encounter: Payer: Self-pay | Admitting: Family Medicine

## 2023-06-11 VITALS — BP 108/75 | HR 82 | Ht 67.0 in | Wt 176.0 lb

## 2023-06-11 DIAGNOSIS — F1121 Opioid dependence, in remission: Secondary | ICD-10-CM | POA: Diagnosis not present

## 2023-06-11 DIAGNOSIS — Z1330 Encounter for screening examination for mental health and behavioral disorders, unspecified: Secondary | ICD-10-CM

## 2023-06-11 MED ORDER — BUPRENORPHINE HCL-NALOXONE HCL 8-2 MG SL FILM
ORAL_FILM | SUBLINGUAL | 1 refills | Status: DC
Start: 2023-06-11 — End: 2023-07-11

## 2023-06-11 NOTE — Progress Notes (Signed)
   GYNECOLOGY OFFICE VISIT NOTE  History:   Monica Vazquez is a 27 y.o. G2P1011 here today for OUD follow up.  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   Health Maintenance:   Last pap: Lab Results  Component Value Date   DIAGPAP (A) 11/11/2020    - Atypical squamous cells of undetermined significance (ASC-US)   HPVHIGH Negative 11/11/2020   Needs repeat 11/2023  Last mammogram:  N/a    Review of Systems:  Pertinent items noted in HPI and remainder of comprehensive ROS otherwise negative.  Physical Exam:  BP 108/75   Pulse 82   Ht 5\' 7"  (1.702 m)   Wt 176 lb (79.8 kg)   LMP 06/05/2023 (Exact Date)   BMI 27.57 kg/m  CONSTITUTIONAL: Well-developed, well-nourished female in no acute distress.  HEENT:  Normocephalic, atraumatic. External right and left ear normal. No scleral icterus.  NECK: Normal range of motion, supple, no masses noted on observation SKIN: No rash noted. Not diaphoretic. No  erythema. No pallor. MUSCULOSKELETAL: Normal range of motion. No edema noted. NEUROLOGIC: Alert and oriented to person, place, and time. Normal muscle tone coordination.  PSYCHIATRIC: Normal mood and affect. Normal behavior. Normal judgment and thought content. RESPIRATORY: Effort normal, no problems with respiration noted   Labs and Imaging No results found for this or any previous visit (from the past 168 hour(s)). No results found.    Assessment and Plan:   Problem List Items Addressed This Visit       Other   Opioid use disorder, moderate, in sustained remission (HCC) - Primary    Doing well UDS Refill sent for two months as I will be on vacation in one month      Relevant Medications   Buprenorphine HCl-Naloxone HCl 8-2 MG FILM   Other Relevant Orders   ToxAssure Flex 15, Ur    Routine preventative health maintenance measures emphasized. Please refer to After Visit Summary for other counseling recommendations.   Return in about 2 months (around 08/12/2023) for OUD f/u.    Total face-to-face time with patient: 15 minutes.  Over 50% of encounter was spent on counseling and coordination of care.   Venora Maples, MD/MPH Attending Family Medicine Physician, [facility] for [facility], [facility]

## 2023-06-11 NOTE — Assessment & Plan Note (Signed)
Doing well UDS Refill sent for two months as I will be on vacation in one month

## 2023-06-20 DIAGNOSIS — F1121 Opioid dependence, in remission: Secondary | ICD-10-CM

## 2023-07-04 ENCOUNTER — Encounter: Payer: Self-pay | Admitting: Family Medicine

## 2023-07-04 DIAGNOSIS — F1121 Opioid dependence, in remission: Secondary | ICD-10-CM

## 2023-07-08 MED ORDER — BUPRENORPHINE HCL-NALOXONE HCL 8-2 MG SL FILM
1.0000 | ORAL_FILM | Freq: Every day | SUBLINGUAL | 0 refills | Status: DC
Start: 2023-07-08 — End: 2023-07-11

## 2023-07-11 ENCOUNTER — Other Ambulatory Visit (HOSPITAL_COMMUNITY): Payer: Self-pay

## 2023-07-11 ENCOUNTER — Other Ambulatory Visit: Payer: Self-pay | Admitting: Family Medicine

## 2023-07-11 DIAGNOSIS — F1121 Opioid dependence, in remission: Secondary | ICD-10-CM

## 2023-07-11 MED ORDER — SUBOXONE 8-2 MG SL FILM
1.0000 | ORAL_FILM | Freq: Four times a day (QID) | SUBLINGUAL | 0 refills | Status: DC
Start: 2023-07-11 — End: 2023-07-11
  Filled 2023-07-11: qty 135, 45d supply, fill #0

## 2023-07-11 MED ORDER — SUBOXONE 8-2 MG SL FILM
1.0000 | ORAL_FILM | Freq: Four times a day (QID) | SUBLINGUAL | 0 refills | Status: DC
Start: 2023-07-11 — End: 2024-06-09

## 2023-07-11 NOTE — Progress Notes (Signed)
Sent in 1 month supply +15 additional.

## 2023-07-11 NOTE — Telephone Encounter (Signed)
Suboxone Prescribing:  Seen by Dr. Crissie Reese on 7/9. Note references possible increased night doses but the prescription was sent for 105 films with two refills.   7/9-- Patient filled #105 films at [facility].   She was using 4 films per day instead of 3.5 in the prescriptions. She ran out of medication.   I attempted to send in 15 films to cover until she would have a routine refill from Dr. Crissie Reese of #105 films.   Patient was unable fill at [facility] and request transfer to [facility].  I sent in #120 films there per her request. However they are unable to fill this today and patient requested the prescription be sent to [facility].   I spoke with the pharmacist at [facility] with our RN Judeth Cornfield to clarify plan.    I have sent #120 films to [facility].   Patient has follow up scheduled on 07/30/23 with Dr. Marland Mcalpine

## 2023-07-30 ENCOUNTER — Encounter: Payer: MEDICAID | Admitting: Family Medicine

## 2023-07-30 ENCOUNTER — Ambulatory Visit: Payer: MEDICAID | Admitting: Family Medicine

## 2023-08-08 ENCOUNTER — Ambulatory Visit: Payer: MEDICAID | Admitting: Family Medicine

## 2023-09-10 ENCOUNTER — Other Ambulatory Visit (HOSPITAL_COMMUNITY): Payer: Self-pay

## 2023-12-23 ENCOUNTER — Telehealth: Payer: Self-pay | Admitting: Family Medicine

## 2023-12-23 NOTE — Telephone Encounter (Signed)
She just called in to get scheduled with you for suboxone refill. She stated she was in jail for 5 months and have been released on 12/20. She had some left over from before and have been using that to get her through. I got her scheduled for this Thursday with Dr. Crissie Reese.

## 2023-12-26 ENCOUNTER — Ambulatory Visit: Payer: MEDICAID | Admitting: Family Medicine

## 2023-12-30 ENCOUNTER — Telehealth: Payer: Self-pay | Admitting: Family Medicine

## 2023-12-30 NOTE — Telephone Encounter (Signed)
I reached out to the number in the system, her father answered and informed me she is not home. He said he will let her know to call us so I can get her rescheduled.

## 2024-05-04 ENCOUNTER — Ambulatory Visit (INDEPENDENT_AMBULATORY_CARE_PROVIDER_SITE_OTHER): Payer: MEDICAID | Admitting: *Deleted

## 2024-05-04 DIAGNOSIS — Z3201 Encounter for pregnancy test, result positive: Secondary | ICD-10-CM | POA: Diagnosis not present

## 2024-05-04 DIAGNOSIS — Z3200 Encounter for pregnancy test, result unknown: Secondary | ICD-10-CM

## 2024-05-04 LAB — POCT PREGNANCY, URINE: Preg Test, Ur: POSITIVE — AB

## 2024-05-04 MED ORDER — PRENATAL 27-1 MG PO TABS: 1.0000 | ORAL_TABLET | Freq: Every day | ORAL | 11 refills | Status: DC

## 2024-05-04 NOTE — Progress Notes (Signed)
 Patient dropped off a urine for a pregnancy test which was positive.  I called her and informed her of results . She confirms she has had a positive pregnancy test at home. States she did not have a period in May and had a 3 day period in mid April and that her periods usually last 5-7 days, this makes her about 39 weeks2d with EDD of 11/21/24. She plans to go here for pregnancy and we discussed due to her period not being normal for her that we recommend dating US . US  scheduled for next week. I reviewed meds and she is still on suboxone . She wants to see Dr. Ilona Malta again. She requested PNV RX which was sent. I transferred call to front desk to schedule new ob.  Monica Vazquez

## 2024-05-04 NOTE — Patient Instructions (Signed)
 Prenatal Care Providers           Center for [facility] @ [facility]  [address] [phone]  Center for [facility] @ [facility]   [address]  [phone]  [facility] @ [facility]       [address] [phone]            Center for [facility] @ [place]     [phone]          Center for [facility] @ [facility]   [address] [phone]  Center for [facility] @ [facility]  [address] [phone]     Center for [facility] @ [address] [place])  [address]   [phone]     [facility]  Phone: [phone]  [facility]  Phone: [phone]  [facility] Phone: [phone]  [facility] Phone: [phone]  [facility] Phone: [phone]  [facility] Phone: [phone]  [facility]  Phone: [phone]

## 2024-05-06 ENCOUNTER — Other Ambulatory Visit: Payer: Self-pay | Admitting: *Deleted

## 2024-05-06 DIAGNOSIS — Z3687 Encounter for antenatal screening for uncertain dates: Secondary | ICD-10-CM

## 2024-05-11 ENCOUNTER — Ambulatory Visit: Payer: MEDICAID

## 2024-05-11 DIAGNOSIS — Z3A08 8 weeks gestation of pregnancy: Secondary | ICD-10-CM

## 2024-05-11 DIAGNOSIS — Z3687 Encounter for antenatal screening for uncertain dates: Secondary | ICD-10-CM

## 2024-05-19 ENCOUNTER — Ambulatory Visit: Payer: MEDICAID | Admitting: Family Medicine

## 2024-06-09 ENCOUNTER — Other Ambulatory Visit: Payer: Self-pay

## 2024-06-09 ENCOUNTER — Encounter: Payer: Self-pay | Admitting: Family Medicine

## 2024-06-09 ENCOUNTER — Ambulatory Visit: Payer: MEDICAID | Admitting: Family Medicine

## 2024-06-09 VITALS — BP 110/76 | HR 85 | Wt 222.3 lb

## 2024-06-09 DIAGNOSIS — O0991 Supervision of high risk pregnancy, unspecified, first trimester: Secondary | ICD-10-CM

## 2024-06-09 DIAGNOSIS — Z3A12 12 weeks gestation of pregnancy: Secondary | ICD-10-CM | POA: Diagnosis not present

## 2024-06-09 DIAGNOSIS — O0990 Supervision of high risk pregnancy, unspecified, unspecified trimester: Secondary | ICD-10-CM

## 2024-06-09 DIAGNOSIS — O219 Vomiting of pregnancy, unspecified: Secondary | ICD-10-CM

## 2024-06-09 DIAGNOSIS — R350 Frequency of micturition: Secondary | ICD-10-CM

## 2024-06-09 DIAGNOSIS — O09891 Supervision of other high risk pregnancies, first trimester: Secondary | ICD-10-CM | POA: Diagnosis not present

## 2024-06-09 DIAGNOSIS — R87610 Atypical squamous cells of undetermined significance on cytologic smear of cervix (ASC-US): Secondary | ICD-10-CM

## 2024-06-09 DIAGNOSIS — Z2839 Other underimmunization status: Secondary | ICD-10-CM

## 2024-06-09 DIAGNOSIS — F1121 Opioid dependence, in remission: Secondary | ICD-10-CM | POA: Diagnosis not present

## 2024-06-09 DIAGNOSIS — O09899 Supervision of other high risk pregnancies, unspecified trimester: Secondary | ICD-10-CM

## 2024-06-09 DIAGNOSIS — Z1332 Encounter for screening for maternal depression: Secondary | ICD-10-CM

## 2024-06-09 DIAGNOSIS — F1120 Opioid dependence, uncomplicated: Secondary | ICD-10-CM | POA: Diagnosis not present

## 2024-06-09 DIAGNOSIS — O99320 Drug use complicating pregnancy, unspecified trimester: Secondary | ICD-10-CM

## 2024-06-09 MED ORDER — ONDANSETRON 4 MG PO TBDP
8.0000 mg | ORAL_TABLET | Freq: Three times a day (TID) | ORAL | 2 refills | Status: DC | PRN
Start: 2024-06-09 — End: 2024-07-07

## 2024-06-09 MED ORDER — NALOXONE HCL 4 MG/0.1ML NA LIQD
NASAL | 1 refills | Status: AC
Start: 2024-06-09 — End: ?

## 2024-06-09 MED ORDER — SUBOXONE 8-2 MG SL FILM
1.0000 | ORAL_FILM | Freq: Three times a day (TID) | SUBLINGUAL | 0 refills | Status: DC
Start: 2024-06-09 — End: 2024-07-07

## 2024-06-09 NOTE — Progress Notes (Signed)
   Subjective:   Monica Vazquez is a 28 y.o. G3P1011 here today for ongoing prenatal care, substance use management and substance exposed newborn care preparation.  Monica Vazquez attends this visit with her 45 year old son.  She reports using suboxone  with her previous pregnancy and being familiar with extended newborn stay and Eat, Sleep and Console management.  She discloses recent incarceration and buying suboxone  from the street prior to today's visit.  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     Objective:    Monica Vazquez is well appearing and in no current distress.  Asking appropriate questions and relaying accurate information as it relates to substance exposed newborn observation and management.    Assessment and Plan:  Substance exposed newborn pamphlet given to Monica Vazquez at this visit and Eat, Sleep and Console management reviewed.  She has a pediatrician for her 28 year old but is inquiring about Mom-Baby Combined Care program-encouraged her to speak  with Dr. Lola further.  She has substance exposed newborn consult contact if needs arise prior to next visit. Problem List Items Addressed This Visit       Other   Supervision of high risk pregnancy, antepartum - Primary   Rubella non-immune status, antepartum   Opioid use disorder, moderate, in sustained remission (HCC)    Routine preventative health maintenance measures emphasized. Please refer to After Visit Summary for other counseling recommendations.   No follow-ups on file.    Total face-to-face time with patient: 20 minutes.  Over 50% of encounter was spent on counseling and coordination of care.   Delon LITTIE Frater, NNP-BC Neonatal Nurse Practitioner Substance Exposed Newborn Consult at the [facility] [phone]

## 2024-06-09 NOTE — Progress Notes (Signed)
 Subjective:  Monica Vazquez is a 28 y.o. G3P1011 at [redacted]w[redacted]d by 8 week US  being seen today for initiation of prenatal care.  She is currently monitored for the following issues for this high-risk pregnancy and has Supervision of high risk pregnancy, antepartum; Smoker; Anxiety; Rubella non-immune status, antepartum; Alpha thalassemia silent carrier; and Opioid use disorder, moderate, in sustained remission (HCC) on their problem list.  Patient reports nausea and vomiting.  Contractions: Not present. Vag. Bleeding: None.  Movement: Absent. Denies leaking of fluid.   The following portions of the patient's history were reviewed and updated as appropriate: allergies, current medications, past family history, past medical history, past social history, past surgical history and problem list. Problem list updated.  Objective:   Vitals:   06/09/24 1528  BP: 110/76  Pulse: 85  Weight: 222 lb 4.8 oz (100.8 kg)    Fetal Status: Fetal Heart Rate (bpm): 159   Movement: Absent     General:  Alert, oriented and cooperative. Patient is in no acute distress.  Skin: Skin is warm and dry. No rash noted.   Cardiovascular: Normal heart rate noted  Respiratory: Normal respiratory effort, no problems with respiration noted  Abdomen: Soft, gravid, appropriate for gestational age. Pain/Pressure: Absent     Pelvic: Vag. Bleeding: None     declined        Extremities: Normal range of motion.  Edema: None  Mental Status: Normal mood and affect. Normal behavior. Normal judgment and thought content.   Urinalysis:      PDMP reviewed during this encounter.   Last UDS: Lab Results  Component Value Date   CREATIUR 282 05/14/2023     Assessment and Plan:  Pregnancy: G3P1011 at [redacted]w[redacted]d  1. Supervision of high risk pregnancy, antepartum (Primary) - ToxAssure Flex 15, Ur - Hemoglobin A1c - Comp Met (CMET) - Culture, OB Urine - CBC/D/Plt+RPR+Rh+ABO+RubIgG... - PANORAMA PRENATAL TEST - US  MFM OB DETAIL  +14 WK; Future  2. Opioid use disorder, moderate, in sustained remission (HCC) - ToxAssure Flex 15, Ur - SUBOXONE  8-2 MG FILM; Place 1 Film under the tongue in the morning, at noon, and at bedtime.  Dispense: 120 Film; Refill: 0 - naloxone  (NARCAN ) nasal spray 4 mg/0.1 mL; Use in case of overdose  Dispense: 1 each; Refill: 1  3. Rubella non-immune status, antepartum - CBC/D/Plt+RPR+Rh+ABO+RubIgG...  4. [redacted] weeks gestation of pregnancy - ToxAssure Flex 15, Ur - Hemoglobin A1c - Comp Met (CMET) - Culture, OB Urine - CBC/D/Plt+RPR+Rh+ABO+RubIgG... - PANORAMA PRENATAL TEST  5. ASCUS of cervix with negative high risk HPV - Cytology - PAP( [facility])  6. Suboxone  maintenance treatment complicating pregnancy, antepartum (HCC) - SUBOXONE  8-2 MG FILM; Place 1 Film under the tongue in the morning, at noon, and at bedtime.  Dispense: 120 Film; Refill: 0 - naloxone  (NARCAN ) nasal spray 4 mg/0.1 mL; Use in case of overdose  Dispense: 1 each; Refill: 1  7. Nausea/vomiting in pregnancy - ondansetron  (ZOFRAN -ODT) 4 MG disintegrating tablet; Take 2 tablets (8 mg total) by mouth every 8 (eight) hours as needed for nausea.  Dispense: 60 tablet; Refill: 2  8. Urinary frequency - urine culture  Preterm labor symptoms and general obstetric precautions including but not limited to vaginal bleeding, contractions, leaking of fluid and fetal movement were reviewed in detail with the patient. Please refer to After Visit Summary for other counseling recommendations.   Return in about 4 weeks (around 07/07/2024) for REACH ROB.   Future Appointments  Date  Time Provider Department Center  07/07/2024  1:15 PM Claudene Route , CNM [facility]    Total face-to-face time with patient: 20 minutes.  Over 50% of encounter was spent on counseling and coordination of care.   Nechemia Chiappetta  [name] 06/09/2024 4:36 PM Center for [employer] [facility], [facility]

## 2024-06-09 NOTE — Progress Notes (Deleted)
 Subjective:   Monica Vazquez is a 28 y.o. G3P1011 at [redacted]w[redacted]d by early ultrasound being seen today for her first obstetrical visit.  Her obstetrical history is significant for OUD. Patient {does/does not:19097} intend to breast feed. Pregnancy history fully reviewed.  Patient reports {sx:14538}.  HISTORY: OB History  Gravida Para Term Preterm AB Living  3 1 1  0 1 1  SAB IAB Ectopic Multiple Live Births  1 0 0 0 1    # Outcome Date GA Lbr Len/2nd Weight Sex Type Anes PTL Lv  3 Current           2 Term 05/04/21 [redacted]w[redacted]d / 02:22 6 lb 4.9 oz (2.86 kg) M Vag-Spont EPI  LIV     Name: Farfan,BOY Shalaya     Apgar1: 8  Apgar5: 9  1 SAB 07/2018             Last pap smear: Result Date Procedure Results Follow-ups  11/11/2020 Cytology - PAP( [facility]) High risk HPV: Negative Adequacy: Satisfactory for evaluation; transformation zone component PRESENT. 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   Claustrophobia    Past Surgical History:  Procedure Laterality Date   implanon     TYMPANOMASTOIDECTOMY Right 09/12/2015   Procedure: RIGHT MODIFIED RADICAL TYMPANOMASTOIDECTOMY;  Surgeon: Daniel Moccasin, MD;  Location: [facility];  Service: ENT;  Laterality: Right;   WISDOM TOOTH EXTRACTION     Family History  Problem Relation Age of Onset   Hypertension Father    Heart disease Father    Social History   Tobacco Use   Smoking status: Every Day    Current packs/day: 0.00    Types: Cigarettes   Smokeless tobacco: Never   Tobacco comments:    smokes 3-4 cig daily  Vaping Use   Vaping status: Former   Substances: Nicotine  Substance Use Topics   Alcohol use: No   Drug use: Yes    Types: Oxycodone , Cocaine, Marijuana    Comment: suboxone , THC a  month ago, cocaine early pregnancy   Allergies  Allergen Reactions   Hydrocodone  Itching    SKIN BURNING. Pt states she can tolerate oxycodone    Current Outpatient Medications on File Prior to Visit  Medication Sig Dispense Refill   hydrOXYzine  (ATARAX ) 10 MG tablet Take 1 tablet (10 mg total) by mouth 3 (three) times daily as needed. 30 tablet 5   Prenatal 27-1 MG TABS Take 1 tablet by mouth daily. 30 tablet 11   SUBOXONE  8-2 MG FILM Place 1 Film under the tongue in the morning, at noon, in the evening, and at bedtime. 120 Film 0   ciprofloxacin -dexamethasone  (CIPRODEX ) OTIC suspension Place 4 drops into the right ear 2 (two) times daily. (Patient not taking: Reported on 06/11/2023)     naloxone  (NARCAN ) nasal spray 4 mg/0.1 mL Use in case of overdose (Patient not taking: Reported on 05/04/2024) 1 each 1   No current facility-administered medications on file prior to visit.     Objective   Vitals:   06/09/24 1528  BP: 110/76  Pulse: 85  Weight: 222 lb 4.8 oz (100.8 kg)      {New OB Pap vs No Pap Exam:26314}   Labs PDMP not reviewed this encounter.***  Last UDS:  Assessment:   Pregnancy: G3P1011 Patient Active Problem List   Diagnosis Date Noted   Opioid use disorder, moderate, in sustained remission (HCC) 05/10/2021   Alpha thalassemia silent carrier 11/09/2020   Rubella non-immune status, antepartum 10/22/2020   Smoker 10/21/2020   Anxiety 10/21/2020   Supervision of high risk pregnancy, antepartum 10/18/2020     Plan:  1. Supervision of high risk pregnancy, antepartum (Primary) ***  2. Opioid use disorder, moderate, in sustained remission (HCC) ***  3. Rubella non-immune status, antepartum ***   Initial labs drawn. Continue prenatal vitamins. Genetic Screening discussed, {Blank multiple:19196::First trimester screen,Quad screen,NIPS}: {requests/ordered/declines:14581}. Ultrasound discussed; fetal anatomic survey:  {requests/ordered/declines:14581}. Problem list reviewed and updated. {CWH vs MBD:26330}  Routine obstetric precautions reviewed. No follow-ups on file.    Donnice CHRISTELLA Carolus, MD/MPH Attending Family Medicine Physician, [facility] for [facility], [facility]

## 2024-06-10 ENCOUNTER — Encounter: Payer: Self-pay | Admitting: *Deleted

## 2024-06-10 LAB — COMPREHENSIVE METABOLIC PANEL WITH GFR
ALT: 8 IU/L (ref 0–32)
AST: 12 IU/L (ref 0–40)
Albumin: 4 g/dL (ref 4.0–5.0)
Alkaline Phosphatase: 51 IU/L (ref 44–121)
BUN/Creatinine Ratio: 11 (ref 9–23)
BUN: 6 mg/dL (ref 6–20)
Bilirubin Total: 0.2 mg/dL (ref 0.0–1.2)
CO2: 17 mmol/L — ABNORMAL LOW (ref 20–29)
Calcium: 8.9 mg/dL (ref 8.7–10.2)
Chloride: 105 mmol/L (ref 96–106)
Creatinine, Ser: 0.53 mg/dL — ABNORMAL LOW (ref 0.57–1.00)
Globulin, Total: 2.3 g/dL (ref 1.5–4.5)
Glucose: 97 mg/dL (ref 70–99)
Potassium: 4 mmol/L (ref 3.5–5.2)
Sodium: 136 mmol/L (ref 134–144)
Total Protein: 6.3 g/dL (ref 6.0–8.5)
eGFR: 131 mL/min/1.73 (ref >59)

## 2024-06-10 LAB — CBC/D/PLT+RPR+RH+ABO+RUBIGG...
Antibody Screen: NEGATIVE
Basophils Absolute: 0 x10E3/uL (ref 0.0–0.2)
Basos: 0 %
EOS (ABSOLUTE): 0.2 x10E3/uL (ref 0.0–0.4)
Eos: 2 %
HCV Ab: REACTIVE — AB
HIV Screen 4th Generation wRfx: NONREACTIVE
Hematocrit: 38 % (ref 34.0–46.6)
Hemoglobin: 11.9 g/dL (ref 11.1–15.9)
Hepatitis B Surface Ag: NEGATIVE
Immature Grans (Abs): 0 x10E3/uL (ref 0.0–0.1)
Immature Granulocytes: 0 %
Lymphocytes Absolute: 1.3 x10E3/uL (ref 0.7–3.1)
Lymphs: 16 %
MCH: 27.3 pg (ref 26.6–33.0)
MCHC: 31.3 g/dL — ABNORMAL LOW (ref 31.5–35.7)
MCV: 87 fL (ref 79–97)
Monocytes Absolute: 0.6 x10E3/uL (ref 0.1–0.9)
Monocytes: 7 %
Neutrophils Absolute: 6.5 x10E3/uL (ref 1.4–7.0)
Neutrophils: 75 %
Platelets: 189 x10E3/uL (ref 150–450)
RBC: 4.36 x10E6/uL (ref 3.77–5.28)
RDW: 14.4 % (ref 11.7–15.4)
RPR Ser Ql: NONREACTIVE
Rh Factor: POSITIVE
Rubella Antibodies, IGG: 1.05 {index} (ref >0.99)
WBC: 8.6 x10E3/uL (ref 3.4–10.8)

## 2024-06-10 LAB — HCV RT-PCR, QUANT (NON-GRAPH): Hepatitis C Quantitation: NOT DETECTED [IU]/mL

## 2024-06-10 LAB — HEMOGLOBIN A1C
Est. average glucose Bld gHb Est-mCnc: 108 mg/dL
Hgb A1c MFr Bld: 5.4 % (ref 4.8–5.6)

## 2024-06-10 NOTE — Progress Notes (Signed)
 Anatomy US  scheduled for 8/27 at 1pm. Genetic counseling with MFM also.

## 2024-06-12 LAB — TOXASSURE FLEX 15, UR
6-ACETYLMORPHINE IA: NEGATIVE ng/mL
7-aminoclonazepam: NOT DETECTED ng/mg{creat}
AMPHETAMINES IA: NEGATIVE ng/mL
Alpha-hydroxyalprazolam: NOT DETECTED ng/mg{creat}
Alpha-hydroxymidazolam: NOT DETECTED ng/mg{creat}
Alpha-hydroxytriazolam: NOT DETECTED ng/mg{creat}
Alprazolam: NOT DETECTED ng/mg{creat}
BARBITURATES IA: NEGATIVE ng/mL
BUPRENORPHINE: POSITIVE
Benzodiazepines: POSITIVE
Buprenorphine: 11 ng/mg{creat}
Clonazepam: NOT DETECTED ng/mg{creat}
Creatinine: 157 mg/dL (ref >=20)
Desalkylflurazepam: NOT DETECTED ng/mg{creat}
Desmethyldiazepam: 14 ng/mg{creat}
Desmethylflunitrazepam: NOT DETECTED ng/mg{creat}
Diazepam: NOT DETECTED ng/mg{creat}
ETHYL ALCOHOL Enzymatic: NEGATIVE g/dL
FENTANYL: NEGATIVE
Fentanyl: NOT DETECTED ng/mg{creat}
Flunitrazepam: NOT DETECTED ng/mg{creat}
Lorazepam: NOT DETECTED ng/mg{creat}
METHADONE IA: NEGATIVE ng/mL
METHADONE MTB IA: NEGATIVE ng/mL
Midazolam: NOT DETECTED ng/mg{creat}
Norbuprenorphine: 129 ng/mg{creat}
Norfentanyl: NOT DETECTED ng/mg{creat}
OPIATE CLASS IA: NEGATIVE ng/mL
Oxazepam: NOT DETECTED ng/mg{creat}
PHENCYCLIDINE IA: NEGATIVE ng/mL
TAPENTADOL, IA: NEGATIVE ng/mL
TRAMADOL IA: NEGATIVE ng/mL
Temazepam: 19 ng/mg{creat}

## 2024-06-12 LAB — OXYCODONE CLASS, MS, UR RFX
Noroxycodone: 364 ng/mg{creat}
Noroxymorphone: 48 ng/mg{creat}
Oxycodone Class Confirmation: POSITIVE
Oxycodone: 154 ng/mg{creat}
Oxymorphone: 227 ng/mg{creat}

## 2024-06-12 LAB — CANNABINOIDS, MS, UR RFX
Cannabinoids Confirmation: POSITIVE
Carboxy-THC: >637 ng/mg{creat}

## 2024-06-12 LAB — COCAINE AND MTB, MS, UR RFX
Benzoylecgonine: >3185 ng/mg{creat}
Cocaethylene: NOT DETECTED ng/mg{creat}
Cocaine Confirmation: POSITIVE
Cocaine: NOT DETECTED ng/mg{creat}

## 2024-06-15 ENCOUNTER — Ambulatory Visit: Payer: Self-pay | Admitting: Advanced Practice Midwife

## 2024-06-15 ENCOUNTER — Encounter: Payer: Self-pay | Admitting: Advanced Practice Midwife

## 2024-06-15 DIAGNOSIS — O2341 Unspecified infection of urinary tract in pregnancy, first trimester: Secondary | ICD-10-CM

## 2024-06-15 DIAGNOSIS — O2340 Unspecified infection of urinary tract in pregnancy, unspecified trimester: Secondary | ICD-10-CM | POA: Insufficient documentation

## 2024-06-15 LAB — PANORAMA PRENATAL TEST FULL PANEL:PANORAMA TEST PLUS 5 ADDITIONAL MICRODELETIONS: FETAL FRACTION: 6.4

## 2024-06-15 LAB — URINE CULTURE, OB REFLEX

## 2024-06-15 LAB — CULTURE, OB URINE

## 2024-06-15 MED ORDER — NITROFURANTOIN MONOHYD MACRO 100 MG PO CAPS
100.0000 mg | ORAL_CAPSULE | Freq: Two times a day (BID) | ORAL | 0 refills | Status: DC
Start: 2024-06-15 — End: 2024-08-11

## 2024-07-07 ENCOUNTER — Ambulatory Visit: Payer: MEDICAID | Admitting: Advanced Practice Midwife

## 2024-07-07 VITALS — BP 110/68 | HR 88 | Wt 221.6 lb

## 2024-07-07 DIAGNOSIS — F1121 Opioid dependence, in remission: Secondary | ICD-10-CM

## 2024-07-07 DIAGNOSIS — O2341 Unspecified infection of urinary tract in pregnancy, first trimester: Secondary | ICD-10-CM

## 2024-07-07 DIAGNOSIS — O0992 Supervision of high risk pregnancy, unspecified, second trimester: Secondary | ICD-10-CM | POA: Diagnosis not present

## 2024-07-07 DIAGNOSIS — Z3A16 16 weeks gestation of pregnancy: Secondary | ICD-10-CM | POA: Diagnosis not present

## 2024-07-07 DIAGNOSIS — K5900 Constipation, unspecified: Secondary | ICD-10-CM

## 2024-07-07 DIAGNOSIS — O2342 Unspecified infection of urinary tract in pregnancy, second trimester: Secondary | ICD-10-CM | POA: Diagnosis not present

## 2024-07-07 DIAGNOSIS — R768 Other specified abnormal immunological findings in serum: Secondary | ICD-10-CM

## 2024-07-07 DIAGNOSIS — D563 Thalassemia minor: Secondary | ICD-10-CM

## 2024-07-07 DIAGNOSIS — F1120 Opioid dependence, uncomplicated: Secondary | ICD-10-CM

## 2024-07-07 DIAGNOSIS — O219 Vomiting of pregnancy, unspecified: Secondary | ICD-10-CM

## 2024-07-07 DIAGNOSIS — O0990 Supervision of high risk pregnancy, unspecified, unspecified trimester: Secondary | ICD-10-CM

## 2024-07-07 DIAGNOSIS — O99320 Drug use complicating pregnancy, unspecified trimester: Secondary | ICD-10-CM

## 2024-07-07 DIAGNOSIS — O99612 Diseases of the digestive system complicating pregnancy, second trimester: Secondary | ICD-10-CM

## 2024-07-07 MED ORDER — MILK OF MAGNESIA 7.75 % PO SUSP
30.0000 mL | Freq: Every day | ORAL | 2 refills | Status: AC | PRN
Start: 2024-07-07 — End: ?

## 2024-07-07 MED ORDER — SUBOXONE 8-2 MG SL FILM
1.0000 | ORAL_FILM | Freq: Four times a day (QID) | SUBLINGUAL | 0 refills | Status: DC
Start: 2024-07-07 — End: 2024-08-11

## 2024-07-07 MED ORDER — ONDANSETRON 8 MG PO TBDP: 8.0000 mg | ORAL_TABLET | Freq: Three times a day (TID) | ORAL | 3 refills | Status: DC | PRN

## 2024-07-07 NOTE — Progress Notes (Signed)
 Subjective:  Monica Vazquez is a 28 y.o. G3P1011 at [redacted]w[redacted]d being seen today for ongoing prenatal care.  She is currently monitored for the following issues for this high-risk pregnancy and has Supervision of high risk pregnancy, antepartum; Smoker; Anxiety; Rubella non-immune status, antepartum; Alpha thalassemia silent carrier; Opioid use disorder, moderate, in sustained remission (HCC); UTI (urinary tract infection) during pregnancy; and Hepatitis C antibody test positive on their problem list.  Patient reports mild opiate withdrawal Sx on Suboxone  8-2 TID. Denies using unprescribed substances since last visit. ToxAssure on 7/9 + Oxy, Benzos and Cocaine .  Contractions: Not present. Vag. Bleeding: None.  Movement: Present. Denies leaking of fluid.   The following portions of the patient's history were reviewed and updated as appropriate: allergies, current medications, past family history, past medical history, past social history, past surgical history and problem list. Problem list updated.  Objective:   Vitals:   07/07/24 1359  BP: 110/68  Pulse: 88  Weight: 221 lb 9.6 oz (100.5 kg)    Fetal Status: Fetal Heart Rate (bpm): 150   Movement: Present     General:  Alert, oriented and cooperative. Patient is in no acute distress.  Skin: Skin is warm and dry. No rash noted.   Cardiovascular: Normal heart rate noted  Respiratory: Normal respiratory effort, no problems with respiration noted  Abdomen: Soft, gravid, appropriate for gestational age. Pain/Pressure: Present     Pelvic: Vag. Bleeding: None     Cervical exam deferred        Extremities: Normal range of motion.  Edema: Mild pitting, slight indentation  Mental Status: Normal mood and affect. Normal behavior. Normal judgment and thought content.   Urinalysis:      PDMP reviewed during this encounter.   Last UDS: Lab Results  Component Value Date   CREATIUR 157 06/09/2024     Assessment and Plan:  Pregnancy: G3P1011  at [redacted]w[redacted]d  1. Supervision of high risk pregnancy, antepartum (Primary) - ToxAssure Flex 15, Ur - AFP, Serum, Open Spina Bifida - Comp Met (CMET) - Culture, OB Urine - HCV RNA quant  2. Opioid use disorder, moderate, in sustained remission (HCC) - ToxAssure Flex 15, Ur - Comp Met (CMET) - SUBOXONE  8-2 MG FILM; Place 1 Film under the tongue in the morning, at noon, in the evening, and at bedtime.  Dispense: 120 Film; Refill: 0  3. [redacted] weeks gestation of pregnancy  4. Urinary tract infection in mother during first trimester of pregnancy - Culture, OB Urine  5. Alpha thalassemia silent carrier  6. Suboxone  maintenance treatment complicating pregnancy, antepartum (HCC)-- Unprescribed substances on last Toxscreen. Reported getting Suboxone  off of the street that that time. Say she is dong well but having mild withdrawal Sx on 8-2 TID but denies using any more unprescribed substances since last visit 4 weeks ago. Will increase dose. Can add 1/2 or 1 additional film per day.  - SUBOXONE  8-2 MG FILM; Place 1 Film under the tongue in the morning, at noon, in the evening, and at bedtime.  Dispense: 120 Film; Refill: 0  7. Nausea/vomiting in pregnancy - ondansetron  (ZOFRAN -ODT) 8 MG disintegrating tablet; Take 1 tablet (8 mg total) by mouth every 8 (eight) hours as needed for nausea.  Dispense: 50 tablet; Refill: 3  8. Constipation during pregnancy in second trimester - magnesium  hydroxide (MILK OF MAGNESIA) 400 MG/5ML suspension; Take 30 mLs by mouth daily as needed for mild constipation.  Dispense: 355 mL; Refill: 2  9. Hepatitis C  antibody test positive--May be past resolved infection or false positive.  - Comp Met (CMET) - HCV RNA quant   Preterm labor symptoms and general obstetric precautions including but not limited to vaginal bleeding, contractions, leaking of fluid and fetal movement were reviewed in detail with the patient. Please refer to After Visit Summary for other counseling  recommendations.   Return in about 4 weeks (around 08/04/2024) for REACH ROB.   Future Appointments  Date Time Provider Department Center  07/29/2024  1:00 PM [facility] PROVIDER 1 [facility]  07/29/2024  1:30 PM [facility] US5 [facility]  07/29/2024  2:30 PM [facility] GENETIC COUNSELING RM [facility]  08/04/2024  1:35 PM Monica Vazquez , CNM [facility]    Monica Vazquez  Monica HOWARD 07/07/2024 5:14 PM Center for [employer], [facility]

## 2024-07-09 LAB — AFP, SERUM, OPEN SPINA BIFIDA
AFP MoM: 1.2
AFP Value: 31.3 ng/mL
Gest. Age on Collection Date: 16 wk
Maternal Age At EDD: 26.7 a
OSBR Risk 1 IN: 6499
Test Results:: NEGATIVE
Weight: 221 [lb_av]

## 2024-07-09 LAB — COMPREHENSIVE METABOLIC PANEL WITH GFR
ALT: 9 IU/L (ref 0–32)
AST: 12 IU/L (ref 0–40)
Albumin: 3.8 g/dL — ABNORMAL LOW (ref 4.0–5.0)
Alkaline Phosphatase: 59 IU/L (ref 44–121)
BUN/Creatinine Ratio: 11 (ref 9–23)
BUN: 6 mg/dL (ref 6–20)
Bilirubin Total: 0.2 mg/dL (ref 0.0–1.2)
CO2: 20 mmol/L (ref 20–29)
Calcium: 8.8 mg/dL (ref 8.7–10.2)
Chloride: 103 mmol/L (ref 96–106)
Creatinine, Ser: 0.56 mg/dL — ABNORMAL LOW (ref 0.57–1.00)
Globulin, Total: 2.4 g/dL (ref 1.5–4.5)
Glucose: 94 mg/dL (ref 70–99)
Potassium: 3.7 mmol/L (ref 3.5–5.2)
Sodium: 137 mmol/L (ref 134–144)
Total Protein: 6.2 g/dL (ref 6.0–8.5)
eGFR: 129 mL/min/1.73 (ref >59)

## 2024-07-09 LAB — HCV RNA QUANT: Hepatitis C Quantitation: NOT DETECTED [IU]/mL

## 2024-07-10 LAB — TOXASSURE FLEX 15, UR
6-ACETYLMORPHINE IA: NEGATIVE ng/mL
7-aminoclonazepam: NOT DETECTED ng/mg{creat}
AMPHETAMINES IA: NEGATIVE ng/mL
Alpha-hydroxyalprazolam: NOT DETECTED ng/mg{creat}
Alpha-hydroxymidazolam: NOT DETECTED ng/mg{creat}
Alpha-hydroxytriazolam: NOT DETECTED ng/mg{creat}
Alprazolam: NOT DETECTED ng/mg{creat}
BARBITURATES IA: NEGATIVE ng/mL
BUPRENORPHINE: POSITIVE
Benzodiazepines: NEGATIVE
Buprenorphine: >1429 ng/mg{creat}
CANNABINOIDS IA: NEGATIVE ng/mL
COCAINE METABOLITE IA: NEGATIVE ng/mL
Clonazepam: NOT DETECTED ng/mg{creat}
Creatinine: 70 mg/dL (ref >=20)
Desalkylflurazepam: NOT DETECTED ng/mg{creat}
Desmethyldiazepam: NOT DETECTED ng/mg{creat}
Desmethylflunitrazepam: NOT DETECTED ng/mg{creat}
Diazepam: NOT DETECTED ng/mg{creat}
ETHYL ALCOHOL Enzymatic: NEGATIVE g/dL
FENTANYL: NEGATIVE
Fentanyl: NOT DETECTED ng/mg{creat}
Flunitrazepam: NOT DETECTED ng/mg{creat}
Lorazepam: NOT DETECTED ng/mg{creat}
METHADONE IA: NEGATIVE ng/mL
METHADONE MTB IA: NEGATIVE ng/mL
Midazolam: NOT DETECTED ng/mg{creat}
Norbuprenorphine: 63 ng/mg{creat}
Norfentanyl: NOT DETECTED ng/mg{creat}
OPIATE CLASS IA: NEGATIVE ng/mL
OXYCODONE CLASS IA: NEGATIVE ng/mL
Oxazepam: NOT DETECTED ng/mg{creat}
PHENCYCLIDINE IA: NEGATIVE ng/mL
TAPENTADOL, IA: NEGATIVE ng/mL
TRAMADOL IA: NEGATIVE ng/mL
Temazepam: NOT DETECTED ng/mg{creat}

## 2024-07-11 LAB — CULTURE, OB URINE

## 2024-07-11 LAB — URINE CULTURE, OB REFLEX

## 2024-07-20 ENCOUNTER — Ambulatory Visit: Payer: Self-pay | Admitting: Advanced Practice Midwife

## 2024-07-29 ENCOUNTER — Other Ambulatory Visit: Payer: MEDICAID

## 2024-07-29 ENCOUNTER — Ambulatory Visit: Payer: MEDICAID | Attending: *Deleted

## 2024-08-04 ENCOUNTER — Encounter: Payer: MEDICAID | Admitting: Advanced Practice Midwife

## 2024-08-06 ENCOUNTER — Encounter: Payer: Self-pay | Admitting: Advanced Practice Midwife

## 2024-08-07 ENCOUNTER — Other Ambulatory Visit: Payer: Self-pay | Admitting: Medical Genetics

## 2024-08-11 ENCOUNTER — Other Ambulatory Visit: Payer: Self-pay | Admitting: Advanced Practice Midwife

## 2024-08-11 ENCOUNTER — Encounter: Payer: MEDICAID | Admitting: Advanced Practice Midwife

## 2024-08-11 ENCOUNTER — Encounter: Payer: Self-pay | Admitting: Advanced Practice Midwife

## 2024-08-11 DIAGNOSIS — F1121 Opioid dependence, in remission: Secondary | ICD-10-CM

## 2024-08-11 DIAGNOSIS — F1120 Opioid dependence, uncomplicated: Secondary | ICD-10-CM

## 2024-08-11 MED ORDER — SUBOXONE 8-2 MG SL FILM
1.0000 | ORAL_FILM | Freq: Four times a day (QID) | SUBLINGUAL | 0 refills | Status: DC
Start: 2024-08-11 — End: 2024-09-10

## 2024-08-11 NOTE — Progress Notes (Signed)
 Pt DNKA today  due to transportation issues. Recommended reaching out to [facility] for transportation assistance and informed her that REACH staff have limited number of bus passes. Requesting Suboxone  refill. Last two Tx screens are not C/W prescriptions: July was + Oxy, cocaine , Benzos, THC and Suboxone  (which was prescribed). August was B/N ratio was 0.04 suggesting not using consistently vs spiking. Will send Rx 15 days Suboxone  and emphasize need for completing appts for us  to monitor appropriately.

## 2024-08-20 ENCOUNTER — Other Ambulatory Visit: Payer: MEDICAID

## 2024-08-22 ENCOUNTER — Encounter: Payer: Self-pay | Admitting: Advanced Practice Midwife

## 2024-08-28 ENCOUNTER — Ambulatory Visit: Payer: MEDICAID | Admitting: Family Medicine

## 2024-08-28 DIAGNOSIS — Z2839 Other underimmunization status: Secondary | ICD-10-CM

## 2024-08-28 DIAGNOSIS — O0990 Supervision of high risk pregnancy, unspecified, unspecified trimester: Secondary | ICD-10-CM

## 2024-08-28 DIAGNOSIS — D563 Thalassemia minor: Secondary | ICD-10-CM

## 2024-08-28 DIAGNOSIS — R768 Other specified abnormal immunological findings in serum: Secondary | ICD-10-CM

## 2024-08-28 DIAGNOSIS — Z3A23 23 weeks gestation of pregnancy: Secondary | ICD-10-CM

## 2024-08-28 DIAGNOSIS — F1120 Opioid dependence, uncomplicated: Secondary | ICD-10-CM

## 2024-08-28 DIAGNOSIS — F419 Anxiety disorder, unspecified: Secondary | ICD-10-CM

## 2024-08-31 NOTE — Progress Notes (Signed)
 No show

## 2024-09-10 ENCOUNTER — Other Ambulatory Visit: Payer: Self-pay | Admitting: Advanced Practice Midwife

## 2024-09-10 ENCOUNTER — Telehealth: Payer: Self-pay | Admitting: Family Medicine

## 2024-09-10 DIAGNOSIS — F1120 Opioid dependence, uncomplicated: Secondary | ICD-10-CM

## 2024-09-10 DIAGNOSIS — F1121 Opioid dependence, in remission: Secondary | ICD-10-CM

## 2024-09-10 MED ORDER — SUBOXONE 8-2 MG SL FILM
1.0000 | ORAL_FILM | Freq: Four times a day (QID) | SUBLINGUAL | 0 refills | Status: DC
Start: 2024-09-10 — End: 2024-09-22

## 2024-09-10 NOTE — Telephone Encounter (Signed)
 Spoke with RN Harlene with [facility] RN Patient called after hours line due to needing her suboxone  and having withdrawal symptoms.  I called Dr Lola directly He plans to discuss with the patient refills of suboxone .

## 2024-09-10 NOTE — Telephone Encounter (Signed)
 Called patient, reports she has been out.  Concerning rx and UDS history. Last UDS not c/w taking bup regularly. Discussed with patient this concern as well as fact that she has missed multiple appts which she attributes to having a lot going on with her father and mother in law both being ill and hospitalized.   Discussed she must come to next visit or I cannot continue prescribing. Appt moved up to 09/22/2024 and two week refill sent.

## 2024-09-10 NOTE — Telephone Encounter (Signed)
 Called pt to discuss her request. She stated that she needs refill of Suboxone . Her last dose was 2 days ago and she is having detox symptoms. Pt was advised that I will notify Dr. Lola of her request.

## 2024-09-10 NOTE — Telephone Encounter (Signed)
 Patient called to say she needed a Rx refill ASAP. I have scheduled her for the next available appointment.

## 2024-09-10 NOTE — Telephone Encounter (Signed)
 Patient called to say she needed a Rx refill ASAP

## 2024-09-22 ENCOUNTER — Other Ambulatory Visit: Payer: Self-pay

## 2024-09-22 ENCOUNTER — Encounter: Payer: Self-pay | Admitting: Family Medicine

## 2024-09-22 ENCOUNTER — Ambulatory Visit (INDEPENDENT_AMBULATORY_CARE_PROVIDER_SITE_OTHER): Payer: MEDICAID | Admitting: Family Medicine

## 2024-09-22 VITALS — BP 132/83 | HR 116 | Wt 202.2 lb

## 2024-09-22 DIAGNOSIS — F1120 Opioid dependence, uncomplicated: Secondary | ICD-10-CM

## 2024-09-22 DIAGNOSIS — Z2839 Other underimmunization status: Secondary | ICD-10-CM

## 2024-09-22 DIAGNOSIS — O2342 Unspecified infection of urinary tract in pregnancy, second trimester: Secondary | ICD-10-CM | POA: Diagnosis not present

## 2024-09-22 DIAGNOSIS — O2343 Unspecified infection of urinary tract in pregnancy, third trimester: Secondary | ICD-10-CM

## 2024-09-22 DIAGNOSIS — O0992 Supervision of high risk pregnancy, unspecified, second trimester: Secondary | ICD-10-CM | POA: Diagnosis not present

## 2024-09-22 DIAGNOSIS — O0990 Supervision of high risk pregnancy, unspecified, unspecified trimester: Secondary | ICD-10-CM

## 2024-09-22 DIAGNOSIS — F1121 Opioid dependence, in remission: Secondary | ICD-10-CM

## 2024-09-22 DIAGNOSIS — F419 Anxiety disorder, unspecified: Secondary | ICD-10-CM

## 2024-09-22 DIAGNOSIS — R7689 Other specified abnormal immunological findings in serum: Secondary | ICD-10-CM | POA: Diagnosis not present

## 2024-09-22 DIAGNOSIS — Z3A27 27 weeks gestation of pregnancy: Secondary | ICD-10-CM

## 2024-09-22 DIAGNOSIS — O99322 Drug use complicating pregnancy, second trimester: Secondary | ICD-10-CM

## 2024-09-22 MED ORDER — SUBOXONE 8-2 MG SL FILM
1.0000 | ORAL_FILM | Freq: Four times a day (QID) | SUBLINGUAL | 0 refills | Status: DC
Start: 2024-09-22 — End: 2024-10-06

## 2024-09-22 NOTE — Patient Instructions (Signed)

## 2024-09-22 NOTE — Progress Notes (Unsigned)
   Subjective:  Monica Vazquez is a 28 y.o. G3P1011 at [redacted]w[redacted]d being seen today for ongoing prenatal care.  She is currently monitored for the following issues for this high-risk pregnancy and has Supervision of high risk pregnancy, antepartum; Suboxone  maintenance treatment complicating pregnancy, antepartum (HCC); Smoker; Anxiety; Rubella non-immune status, antepartum; Alpha thalassemia silent carrier; Opioid use disorder, moderate, in sustained remission (HCC); and Hepatitis C antibody test positive on their problem list.  Patient reports no complaints.  Contractions: Not present. Vag. Bleeding: None.  Movement: Present. Denies leaking of fluid.   The following portions of the patient's history were reviewed and updated as appropriate: allergies, current medications, past family history, past medical history, past social history, past surgical history and problem list. Problem list updated.  Objective:   Vitals:   09/22/24 1528  BP: 132/83  Pulse: (!) 116  Weight: 202 lb 3.2 oz (91.7 kg)    Fetal Status: Fetal Heart Rate (bpm): 139 Fundal Height: 27 cm Movement: Present     General:  Alert, oriented and cooperative. Patient is in no acute distress.  Skin: Skin is warm and dry. No rash noted.   Cardiovascular: Normal heart rate noted  Respiratory: Normal respiratory effort, no problems with respiration noted  Abdomen: Soft, gravid, appropriate for gestational age. Pain/Pressure: Present     Pelvic: Vag. Bleeding: None     Cervical exam deferred        Extremities: Normal range of motion.  Edema: None  Mental Status: Normal mood and affect. Normal behavior. Normal judgment and thought content.   Urinalysis:      PDMP reviewed during this encounter.    Last UDS: Lab Results  Component Value Date   CREATIUR 70 07/07/2024     Assessment and Plan:  Pregnancy: G3P1011 at [redacted]w[redacted]d  1. Supervision of high risk pregnancy, antepartum (Primary) BP and FHR normal Discussed fasting  labs for next visit Would like to defer flu and TDAP to next visit Still needs anatomy scan, referral placed again today  2. Opioid use disorder, moderate, in sustained remission (HCC) Currently on suboxone  8 QID Last UDS showed inappropriate bup/norbup levels, discussed that I am not accusing her of anything and that just onetime is not a problem but it will be problematic if this becomes a pattern UDS today with verbal consent from patient Refill sent after confirming receipt of urine specimen - ToxAssure Flex 15, Ur  3. Urinary tract infection in mother during third trimester of pregnancy TOC neg, resolved from PL  4. Rubella non-immune status, antepartum Offer MMR PP  5. Hepatitis C antibody test positive Neg viral load earlier this pregnancy  6. Anxiety Not addressed in detail but has a lot going on Her partner Elton's mother died two days ago after being in hospice for several weeks with stage 4 lung cancer Her father, who has been chronically ill for a long time, has also had several hospitalizations recently Continue to address at visits  Preterm labor symptoms and general obstetric precautions including but not limited to vaginal bleeding, contractions, leaking of fluid and fetal movement were reviewed in detail with the patient. Please refer to After Visit Summary for other counseling recommendations.  Return in 2 weeks (on 10/06/2024) for REACH clinic, ob visit, 28 wk labs.  No future appointments.   Lola Donnice HERO, MD

## 2024-09-23 ENCOUNTER — Encounter: Payer: Self-pay | Admitting: Family Medicine

## 2024-09-27 LAB — TOXASSURE FLEX 15, UR
6-ACETYLMORPHINE IA: NEGATIVE ng/mL
7-aminoclonazepam: NOT DETECTED ng/mg{creat}
AMPHETAMINES IA: NEGATIVE ng/mL
Alpha-hydroxyalprazolam: NOT DETECTED ng/mg{creat}
Alpha-hydroxymidazolam: NOT DETECTED ng/mg{creat}
Alpha-hydroxytriazolam: NOT DETECTED ng/mg{creat}
Alprazolam: NOT DETECTED ng/mg{creat}
BARBITURATES IA: NEGATIVE ng/mL
BUPRENORPHINE: POSITIVE
Benzodiazepines: NEGATIVE
Buprenorphine: >694 ng/mg{creat}
CANNABINOIDS IA: NEGATIVE ng/mL
COCAINE METABOLITE IA: NEGATIVE ng/mL
Clonazepam: NOT DETECTED ng/mg{creat}
Creatinine: 144 mg/dL (ref >=20)
Desalkylflurazepam: NOT DETECTED ng/mg{creat}
Desmethyldiazepam: NOT DETECTED ng/mg{creat}
Desmethylflunitrazepam: NOT DETECTED ng/mg{creat}
Diazepam: NOT DETECTED ng/mg{creat}
ETHYL ALCOHOL Enzymatic: NEGATIVE g/dL
FENTANYL: NEGATIVE
Fentanyl: NOT DETECTED ng/mg{creat}
Flunitrazepam: NOT DETECTED ng/mg{creat}
Lorazepam: NOT DETECTED ng/mg{creat}
METHADONE IA: NEGATIVE ng/mL
METHADONE MTB IA: NEGATIVE ng/mL
Midazolam: NOT DETECTED ng/mg{creat}
Norbuprenorphine: 54 ng/mg{creat}
Norfentanyl: NOT DETECTED ng/mg{creat}
OPIATE CLASS IA: NEGATIVE ng/mL
OXYCODONE CLASS IA: NEGATIVE ng/mL
Oxazepam: NOT DETECTED ng/mg{creat}
PHENCYCLIDINE IA: NEGATIVE ng/mL
TAPENTADOL, IA: NEGATIVE ng/mL
TRAMADOL IA: NEGATIVE ng/mL
Temazepam: NOT DETECTED ng/mg{creat}

## 2024-09-29 ENCOUNTER — Encounter: Payer: MEDICAID | Admitting: Family Medicine

## 2024-09-30 ENCOUNTER — Ambulatory Visit: Payer: Self-pay | Admitting: Family Medicine

## 2024-09-30 DIAGNOSIS — O0990 Supervision of high risk pregnancy, unspecified, unspecified trimester: Secondary | ICD-10-CM

## 2024-09-30 DIAGNOSIS — O365930 Maternal care for other known or suspected poor fetal growth, third trimester, not applicable or unspecified: Secondary | ICD-10-CM

## 2024-09-30 NOTE — Progress Notes (Signed)
 Metabolite levels again not appropriate  FYI to [place] 

## 2024-10-01 ENCOUNTER — Other Ambulatory Visit: Payer: Self-pay

## 2024-10-01 DIAGNOSIS — O0990 Supervision of high risk pregnancy, unspecified, unspecified trimester: Secondary | ICD-10-CM

## 2024-10-06 ENCOUNTER — Encounter: Payer: MEDICAID | Admitting: Family Medicine

## 2024-10-06 ENCOUNTER — Ambulatory Visit (INDEPENDENT_AMBULATORY_CARE_PROVIDER_SITE_OTHER): Payer: MEDICAID | Admitting: Family Medicine

## 2024-10-06 ENCOUNTER — Other Ambulatory Visit: Payer: MEDICAID

## 2024-10-06 ENCOUNTER — Encounter: Payer: Self-pay | Admitting: Family Medicine

## 2024-10-06 VITALS — BP 133/85 | HR 120 | Wt 198.3 lb

## 2024-10-06 DIAGNOSIS — F1121 Opioid dependence, in remission: Secondary | ICD-10-CM | POA: Diagnosis not present

## 2024-10-06 DIAGNOSIS — Z3A29 29 weeks gestation of pregnancy: Secondary | ICD-10-CM

## 2024-10-06 DIAGNOSIS — O99323 Drug use complicating pregnancy, third trimester: Secondary | ICD-10-CM | POA: Diagnosis not present

## 2024-10-06 DIAGNOSIS — O0993 Supervision of high risk pregnancy, unspecified, third trimester: Secondary | ICD-10-CM | POA: Diagnosis not present

## 2024-10-06 DIAGNOSIS — F419 Anxiety disorder, unspecified: Secondary | ICD-10-CM | POA: Diagnosis not present

## 2024-10-06 DIAGNOSIS — Z3490 Encounter for supervision of normal pregnancy, unspecified, unspecified trimester: Secondary | ICD-10-CM

## 2024-10-06 DIAGNOSIS — O0990 Supervision of high risk pregnancy, unspecified, unspecified trimester: Secondary | ICD-10-CM

## 2024-10-06 DIAGNOSIS — F1120 Opioid dependence, uncomplicated: Secondary | ICD-10-CM

## 2024-10-06 DIAGNOSIS — Z2839 Other underimmunization status: Secondary | ICD-10-CM

## 2024-10-06 MED ORDER — SUBOXONE 8-2 MG SL FILM: 1.0000 | ORAL_FILM | Freq: Four times a day (QID) | SUBLINGUAL | 0 refills | Status: DC

## 2024-10-06 NOTE — Progress Notes (Signed)
   Subjective:  Monica Vazquez is a 28 y.o. G3P1011 at [redacted]w[redacted]d being seen today for ongoing prenatal care.  She is currently monitored for the following issues for this high-risk pregnancy and has Supervision of high risk pregnancy, antepartum; Suboxone  maintenance treatment complicating pregnancy, antepartum (HCC); Smoker; Anxiety; Rubella non-immune status, antepartum; Alpha thalassemia silent carrier; Opioid use disorder, moderate, in sustained remission (HCC); and Hepatitis C antibody test positive-viral load negative on their problem list.  Patient reports no complaints.  Contractions: Not present. Vag. Bleeding: None.  Movement: Present. Denies leaking of fluid.   The following portions of the patient's history were reviewed and updated as appropriate: allergies, current medications, past family history, past medical history, past social history, past surgical history and problem list. Problem list updated.  Objective:   Vitals:   10/06/24 1128  BP: 133/85  Pulse: (!) 120  Weight: 198 lb 4.8 oz (89.9 kg)    Fetal Status: Fetal Heart Rate (bpm): 150   Movement: Present     General:  Alert, oriented and cooperative. Patient is in no acute distress.  Skin: Skin is warm and dry. No rash noted.   Cardiovascular: Normal heart rate noted  Respiratory: Normal respiratory effort, no problems with respiration noted  Abdomen: Soft, gravid, appropriate for gestational age. Pain/Pressure: Absent     Pelvic: Vag. Bleeding: None     Cervical exam deferred        Extremities: Normal range of motion.  Edema: Trace  Mental Status: Normal mood and affect. Normal behavior. Normal judgment and thought content.   Urinalysis:      PDMP reviewed during this encounter.    Last UDS: Lab Results  Component Value Date   CREATIUR 144 09/22/2024     Assessment and Plan:  Pregnancy: G3P1011 at [redacted]w[redacted]d  1. Supervision of high risk pregnancy, antepartum (Primary) BP and FHR normal Still has not  had anatomy scan, we will check with [facility] and [facility] for appts Came drinking a mountain dew, discussed can't do GTT today, rescheduled for next week Would like to defer flu and tdap to next week  2. Opioid use disorder, moderate, in sustained remission (HCC) Discussed at last visit that UDS did not show appropriate bup/norbup  UDS collected at that time again did not show appropriate metabolite levels Reports she takes her medicines when she first wakes up, around 1300, 1800, and right before going to bed Discussed witnessed dosing as a way to clarify her metabolic profile Plan will be to have her come back for GTT next week, will have her take dose of suboxone  witnessed at beginning and then do UDS at end of visit Refill sent (given limited supply last time), can also do strip count at next visit as well  3. Rubella non-immune status, antepartum Offer MMR PP  4. Anxiety Still dealing with dad's illness and partner Elton's recent death  Preterm labor symptoms and general obstetric precautions including but not limited to vaginal bleeding, contractions, leaking of fluid and fetal movement were reviewed in detail with the patient. Please refer to After Visit Summary for other counseling recommendations.  Return in about 1 week (around 10/13/2024) for REACH clinic, ob visit, 28 wk labs.  Future Appointments  Date Time Provider Department Center  10/13/2024  9:20 AM [facility]  10/13/2024 10:55 AM Lola Donnice HERO, MD [facility]     Lola Donnice HERO, MD

## 2024-10-11 LAB — TOXASSURE FLEX 15, UR
6-ACETYLMORPHINE IA: NEGATIVE ng/mL
7-aminoclonazepam: NOT DETECTED ng/mg{creat}
AMPHETAMINES IA: NEGATIVE ng/mL
Alpha-hydroxyalprazolam: NOT DETECTED ng/mg{creat}
Alpha-hydroxymidazolam: NOT DETECTED ng/mg{creat}
Alpha-hydroxytriazolam: NOT DETECTED ng/mg{creat}
Alprazolam: NOT DETECTED ng/mg{creat}
BARBITURATES IA: NEGATIVE ng/mL
BUPRENORPHINE: POSITIVE
Benzodiazepines: NEGATIVE
Buprenorphine: >840 ng/mg{creat}
CANNABINOIDS IA: NEGATIVE ng/mL
COCAINE METABOLITE IA: NEGATIVE ng/mL
Clonazepam: NOT DETECTED ng/mg{creat}
Creatinine: 119 mg/dL (ref >=20)
Desalkylflurazepam: NOT DETECTED ng/mg{creat}
Desmethyldiazepam: NOT DETECTED ng/mg{creat}
Desmethylflunitrazepam: NOT DETECTED ng/mg{creat}
Diazepam: NOT DETECTED ng/mg{creat}
ETHYL ALCOHOL Enzymatic: NEGATIVE g/dL
FENTANYL: NEGATIVE
Fentanyl: NOT DETECTED ng/mg{creat}
Flunitrazepam: NOT DETECTED ng/mg{creat}
Lorazepam: NOT DETECTED ng/mg{creat}
METHADONE IA: NEGATIVE ng/mL
METHADONE MTB IA: NEGATIVE ng/mL
Midazolam: NOT DETECTED ng/mg{creat}
Norbuprenorphine: 55 ng/mg{creat}
Norfentanyl: NOT DETECTED ng/mg{creat}
OPIATE CLASS IA: NEGATIVE ng/mL
OXYCODONE CLASS IA: NEGATIVE ng/mL
Oxazepam: NOT DETECTED ng/mg{creat}
PHENCYCLIDINE IA: NEGATIVE ng/mL
TAPENTADOL, IA: NEGATIVE ng/mL
TRAMADOL IA: NEGATIVE ng/mL
Temazepam: NOT DETECTED ng/mg{creat}

## 2024-10-12 ENCOUNTER — Ambulatory Visit: Payer: Self-pay | Admitting: Family Medicine

## 2024-10-12 NOTE — Progress Notes (Signed)
 UDS ratios again not appropriate and not consistent with patient taking medication regularly, more c/w spiking prior to visit. See same day document, coming to clinic for observed administration and UDS at end of 2hr GTT, if continue to be inappropriate then will likely need to stop prescribing

## 2024-10-13 ENCOUNTER — Other Ambulatory Visit: Payer: MEDICAID

## 2024-10-13 ENCOUNTER — Encounter: Payer: MEDICAID | Admitting: Family Medicine

## 2024-10-19 ENCOUNTER — Encounter: Payer: Self-pay | Admitting: Family Medicine

## 2024-10-19 DIAGNOSIS — F1121 Opioid dependence, in remission: Secondary | ICD-10-CM

## 2024-10-19 DIAGNOSIS — F1120 Opioid dependence, uncomplicated: Secondary | ICD-10-CM

## 2024-10-21 MED ORDER — SUBOXONE 8-2 MG SL FILM: 1.0000 | ORAL_FILM | Freq: Four times a day (QID) | SUBLINGUAL | 0 refills | Status: DC

## 2024-10-22 ENCOUNTER — Telehealth: Payer: Self-pay

## 2024-11-05 ENCOUNTER — Other Ambulatory Visit: Payer: Self-pay | Admitting: Obstetrics

## 2024-11-05 ENCOUNTER — Ambulatory Visit: Payer: MEDICAID | Attending: Obstetrics and Gynecology

## 2024-11-05 ENCOUNTER — Ambulatory Visit: Payer: MEDICAID | Admitting: Maternal & Fetal Medicine

## 2024-11-05 VITALS — BP 132/61 | HR 67

## 2024-11-05 DIAGNOSIS — Z3A33 33 weeks gestation of pregnancy: Secondary | ICD-10-CM | POA: Diagnosis present

## 2024-11-05 DIAGNOSIS — F1120 Opioid dependence, uncomplicated: Secondary | ICD-10-CM

## 2024-11-05 DIAGNOSIS — F1121 Opioid dependence, in remission: Secondary | ICD-10-CM | POA: Diagnosis present

## 2024-11-05 DIAGNOSIS — O365990 Maternal care for other known or suspected poor fetal growth, unspecified trimester, not applicable or unspecified: Secondary | ICD-10-CM | POA: Insufficient documentation

## 2024-11-05 DIAGNOSIS — O99320 Drug use complicating pregnancy, unspecified trimester: Secondary | ICD-10-CM | POA: Insufficient documentation

## 2024-11-05 DIAGNOSIS — O365930 Maternal care for other known or suspected poor fetal growth, third trimester, not applicable or unspecified: Secondary | ICD-10-CM | POA: Diagnosis present

## 2024-11-05 DIAGNOSIS — O0990 Supervision of high risk pregnancy, unspecified, unspecified trimester: Secondary | ICD-10-CM | POA: Insufficient documentation

## 2024-11-05 NOTE — Progress Notes (Signed)
 Patient information  Patient Name: Monica Vazquez  Patient MRN:   [id number]  Referring practice: MFM Referring Provider: [facility])  Problem List   Patient Active Problem List   Diagnosis Date Noted   Hepatitis C antibody test positive-viral load negative 07/07/2024   Opioid use disorder, moderate, in sustained remission (HCC) 05/10/2021   Alpha thalassemia silent carrier 11/09/2020   Rubella non-immune status, antepartum 10/22/2020   Smoker 10/21/2020   Anxiety 10/21/2020   Suboxone  maintenance treatment complicating pregnancy, antepartum (HCC) 10/20/2020   Supervision of high risk pregnancy, antepartum 10/18/2020   Maternal Fetal medicine Consult  Monica Vazquez is a 28 y.o. G3P1011 at [redacted]w[redacted]d here for ultrasound and consultation. Monica Vazquez is doing well today with no acute concerns. Today we focused on the following:   Fetal growth restriction I discussed the finding of fetal growth restriction (FGR) with the patient today. The ultrasound shows an overall growth at the 6 percentile and the abdominal circumference at the 2 percentile. The umbilical artery Dopplers are normal. The BPP was 8/8.  I counseled her about the clinical significance of the Doppler findings and antenatal testing. I discussed the various causes of growth restriction including constitutionally small fetus, placental insufficiency, genetic problems and chronic maternal disease. Currently there is no evidence of sonographic stigmata suggesting infection or aneuploidy.  I discussed the most likely cause of her fetal growth restriction is likely a combination of placental insufficiency and tobacco exposure.  She smokes about 1 pack/day which can impair fetal growth.  I encouraged her to cut back by at least 2 cigarettes/day/week if possible with the goal of quitting.  She also smokes marijuana on occasion.  I encouraged her to minimize exposure to all nonprescribed substance if  possible to minimize the risk of adverse outcomes associated with these exposures like growth restriction and stillbirth.. I also discussed the importance of antenatal fetal surveillance including antenatal testing and umbilical artery Doppler assessment to reduce the risk of stillbirth.  I discussed the management going forward in the pregnancy with potential alteration in the timing of delivery. Currently she feels well and denies headache, vision changes, right upper quadrant pain, contractions, vaginal bleeding or loss of fluid.  She reports good fetal movement.   Recommendations - Continue weekly UA Dopplers and BPP w/ NST due to [employer] <3%.  If umbilical artery Dopplers become elevated with periods of absent end-diastolic flow she should have a nonstress test added to allow for twice-weekly antenatal testing. - Continue serial growth ultrasounds every 3 weeks until delivery. - Delivery timing will depend on future antenatal testing and UA dopplers, but likely around 37 weeks -Continue Suboxone  as prescribed -Cut back on tobacco use with the goal of smoking -Avoid THC during pregnancy  The patient had time to ask questions that were answered to her satisfaction.  She verbalized understanding and agrees to proceed with the plan below.  Standard OB precautions were given to the patient.   I spent 30 minutes reviewing the patients chart, including labs and images as well as counseling the patient about her medical conditions. Greater than 50% of the time was spent in direct face-to-face patient counseling.  Monica Vazquez  MFM, [place]   11/05/2024  2:25 PM   Review of Systems: A review of systems was performed and was negative except per HPI   Vitals and Physical Exam    11/05/2024   10:23 AM 10/06/2024   11:28  AM 09/22/2024    3:28 PM  Vitals with BMI  Weight  198 lbs 5 oz 202 lbs 3 oz  Systolic 132 133 867  Diastolic 61 85 83  Pulse 67 120 116    Sitting comfortably on the sonogram  table Nonlabored breathing Normal rate and rhythm Abdomen is nontender  Past pregnancies OB History  Gravida Para Term Preterm AB Living  3 1 1  0 1 1  SAB IAB Ectopic Multiple Live Births  1 0 0 0 1    # Outcome Date GA Lbr Len/2nd Weight Sex Type Anes PTL Lv  3 Current           2 Term 05/04/21 [redacted]w[redacted]d / 02:22 6 lb 4.9 oz (2.86 kg) M Vag-Spont EPI  LIV  1 SAB 07/2018             Future Appointments  Date Time Provider Department Center  11/12/2024  3:15 PM [facility] None

## 2024-11-06 DIAGNOSIS — O365990 Maternal care for other known or suspected poor fetal growth, unspecified trimester, not applicable or unspecified: Secondary | ICD-10-CM | POA: Insufficient documentation

## 2024-11-12 ENCOUNTER — Other Ambulatory Visit: Payer: MEDICAID

## 2024-12-01 ENCOUNTER — Ambulatory Visit: Payer: MEDICAID | Admitting: Advanced Practice Midwife

## 2024-12-01 ENCOUNTER — Other Ambulatory Visit (HOSPITAL_COMMUNITY)
Admission: RE | Admit: 2024-12-01 | Discharge: 2024-12-01 | Disposition: A | Discharge status: Home / Self Care | Payer: MEDICAID | Source: Ambulatory Visit | Attending: Advanced Practice Midwife | Admitting: Advanced Practice Midwife

## 2024-12-01 ENCOUNTER — Other Ambulatory Visit: Payer: Self-pay | Admitting: Family Medicine

## 2024-12-01 VITALS — BP 120/71 | HR 87 | Wt 202.5 lb

## 2024-12-01 DIAGNOSIS — F1121 Opioid dependence, in remission: Secondary | ICD-10-CM

## 2024-12-01 DIAGNOSIS — O0990 Supervision of high risk pregnancy, unspecified, unspecified trimester: Secondary | ICD-10-CM | POA: Diagnosis present

## 2024-12-01 DIAGNOSIS — F1120 Opioid dependence, uncomplicated: Secondary | ICD-10-CM

## 2024-12-01 NOTE — Progress Notes (Unsigned)
" ° °  Subjective:   Monica Vazquez is a 28 y.o. G3P1011 here today for ongoing substance exposed newborn consult.   Health Maintenance Due  Topic Date Due   HPV VACCINES (1 - 3-dose series) Never done   Pneumococcal Vaccine (1 of 2 - PCV) Never done   Hepatitis B Vaccines 19-59 Average Risk (1 of 3 - 19+ 3-dose series) Never done   Cervical Cancer Screening (Pap smear)  11/12/2023   Influenza Vaccine  07/03/2024   COVID-19 Vaccine (1 - 2025-26 season) Never done    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   Claustrophobia     Past Surgical History:  Procedure Laterality Date   implanon     TYMPANOMASTOIDECTOMY Right 09/12/2015   Procedure: RIGHT MODIFIED RADICAL TYMPANOMASTOIDECTOMY;  Surgeon: Daniel Moccasin, MD;  Location: [facility];  Service: ENT;  Laterality: Right;   WISDOM TOOTH EXTRACTION      The following portions of the patient's history were reviewed and updated as appropriate: allergies, current medications, past family history, past medical history, past social history, past surgical history and problem list.     Objective:   Evangelyne is well appearing in no acute distress. She has linear thinking and clear communication.      Assessment and Plan:  Met with Fumi today at [facility] for ongoing substance exposed newborn consult. We discussed her ongoing prenatal care and expectations following delivery. She stated she is still having occasional nausea and vomiting, however withdrawal symptoms have been well managed.   Encouraged Karmah to contact NAS consult phone in between appointments with any further questions or concerns.     Problem List Items Addressed This Visit   None   Routine preventative health maintenance measures emphasized. Please refer to After Visit Summary for other counseling recommendations.   No follow-ups on file.    Total  face-to-face time with patient: 30 minutes.  Over 50% of encounter was spent on counseling and coordination of care.   Comer Fang, NNP-BC Neonatal Nurse Practitioner Substance Exposed Newborn Consult at the [facility] [phone]   "

## 2024-12-02 ENCOUNTER — Encounter: Payer: Self-pay | Admitting: Advanced Practice Midwife

## 2024-12-02 NOTE — Progress Notes (Unsigned)
" ° °  Subjective:  Monica Vazquez is a 28 y.o. G3P1011 at [redacted]w[redacted]d being seen today for ongoing prenatal care.  She is currently monitored for the following issues for this {Blank single:19197::high-risk,low-risk} pregnancy and has Supervision of high risk pregnancy, antepartum; Suboxone  maintenance treatment complicating pregnancy, antepartum (HCC); Smoker; Anxiety; Rubella non-immune status, antepartum; Alpha thalassemia silent carrier; Opioid use disorder, moderate, in sustained remission (HCC); Hepatitis C antibody test positive-viral load negative; and IUGR (intrauterine growth restriction) affecting care of mother on their problem list.  Patient reports {sx:[id number]}.  Contractions: Not present. Vag. Bleeding: None.  Movement: Present. Denies leaking of fluid.   Per Dr. Merlynn last note 11/__/25: UDS ratios again not appropriate and not consistent with patient taking medication regularly, more c/w spiking prior to visit. See same day document, coming to clinic for observed administration and UDS at end of 2hr GTT, if continue to be inappropriate then will likely need to stop prescribing  DNKA for GTT. Reports taking 2 unprescribed 8 mg Suboxone  films per day  that she gest from her sister.    The following portions of the patient's history were reviewed and updated as appropriate: allergies, current medications, past family history, past medical history, past social history, past surgical history and problem list. Problem list updated.  Objective:   Vitals:   12/01/24 1614  BP: 120/71  Pulse: 87  Weight: 202 lb 8 oz (91.9 kg)    Fetal Status: Fetal Heart Rate (bpm): 140   Movement: Present     General:  Alert, oriented and cooperative. Patient is in no acute distress.  Skin: Skin is warm and dry. No rash noted.   Cardiovascular: Normal heart rate noted  Respiratory: Normal respiratory effort, no problems with respiration noted  Abdomen: Soft, gravid, appropriate for gestational  age. Pain/Pressure: Absent     Pelvic: Vag. Bleeding: None Vag D/C Character: Watery   {Blank single:19197::Cervical exam performed,Cervical exam deferred}        Extremities: Normal range of motion.  Edema: Mild pitting, slight indentation  Mental Status: Normal mood and affect. Normal behavior. Normal judgment and thought content.   Urinalysis:      PDMP not reviewed this encounter.***  Last UDS: Lab Results  Component Value Date   CREATIUR 119 10/06/2024     Assessment and Plan:  Pregnancy: G3P1011 at [redacted]w[redacted]d 1. Supervision of high risk pregnancy, antepartum (Primary) - Cervicovaginal ancillary only( [place]) - Culture, beta strep (group b only)  2. Opioid use disorder, moderate, in sustained remission (HCC) - ToxAssure Flex 15, Ur   No problem-specific Assessment & Plan notes found for this encounter.   {Blank single:19197::Term,Preterm} labor symptoms and general obstetric precautions including but not limited to vaginal bleeding, contractions, leaking of fluid and fetal movement were reviewed in detail with the patient. Please refer to After Visit Summary for other counseling recommendations.   No follow-ups on file.   Future Appointments  Date Time Provider Department Center  12/07/2024  2:00 PM [facility] PROVIDER 1 [facility]  12/07/2024  2:15 PM [facility] NST [facility]  12/16/2024  3:15 PM [facility]    Total face-to-face time with patient: {Blank single:19197::10,15,20,25,30} minutes.  Over 50% of encounter was spent on counseling and coordination of care.   [name]  [name] 12/02/2024 12:12 PM Center for [place], [facility]   "

## 2024-12-03 NOTE — L&D Delivery Note (Addendum)
 OB/GYN Faculty Practice Delivery Note  Monica Vazquez is a 29 y.o. G3P1011 s/p SVD at [redacted]w[redacted]d. She was admitted for SOL.   ROM: 22h 65m with moderate meconium fluid GBS Status: Negative Maximum Maternal Temperature: 98.0  Labor Progress  Monica Vazquez was admitted for SROM without labor. She was augmented with Pitocin  after prolonged ROM and progressed quickly to 10cm.  Delivery Date/Time: 12/11/2024 @ 1243 Delivery: SNM and CNM called to room and patient was complete and pushing. Head delivered OA by SNM. Body cord present x1, delivered through. Shoulder and body delivered in usual fashion. Infant with spontaneous cry, placed on mother's abdomen, dried and stimulated. Cord clamped x 2 after 1-minute delay, and cut by FOB. Cord blood drawn. Placenta delivered spontaneously, intact, with 3-vessel cord. Fundus firm with massage and Pitocin . Patient intermittent cath post placenta. Labia, perineum, vagina, and cervix inspected, No laceration found.   Placenta: 12/11/2024 at 1254 Complications: None Lacerations: None EBL: 200 Analgesia: Epidural  Postpartum Planning Monica Vazquez ] transfer orders to MB Monica Vazquez ] discharge summary started & shared Monica Vazquez ] message to sent to schedule follow-up  Monica Vazquez ] lists updated  Infant: Baby Girl  APGARs 9/9  Weight 2670  Monica Vazquez, SNM 12/11/2024, 1:22 PM

## 2024-12-04 ENCOUNTER — Ambulatory Visit: Payer: Self-pay | Admitting: Advanced Practice Midwife

## 2024-12-04 LAB — CERVICOVAGINAL ANCILLARY ONLY
Chlamydia: NEGATIVE
Comment: NEGATIVE
Comment: NORMAL
Neisseria Gonorrhea: NEGATIVE

## 2024-12-04 NOTE — BH Specialist Note (Signed)
 Less than 5 minutes for brief phone check; agrees to follow-up in two weeks, as pt and baby just came home from the hospital today. Pt has good support at home with baby's father and baby's maternal grandma today; pt will call Missie Gehrig at [phone] as needed prior to appointment on 12/31/23.

## 2024-12-05 LAB — TOXASSURE FLEX 15, UR
6-ACETYLMORPHINE IA: NEGATIVE ng/mL
7-aminoclonazepam: NOT DETECTED ng/mg{creat}
AMPHETAMINES IA: NEGATIVE ng/mL
Alpha-hydroxyalprazolam: NOT DETECTED ng/mg{creat}
Alpha-hydroxymidazolam: NOT DETECTED ng/mg{creat}
Alpha-hydroxytriazolam: NOT DETECTED ng/mg{creat}
Alprazolam: NOT DETECTED ng/mg{creat}
BARBITURATES IA: NEGATIVE ng/mL
BUPRENORPHINE: NEGATIVE
Benzodiazepines: NEGATIVE
Buprenorphine: NOT DETECTED ng/mg{creat}
CANNABINOIDS IA: NEGATIVE ng/mL
COCAINE METABOLITE IA: NEGATIVE ng/mL
Clonazepam: NOT DETECTED ng/mg{creat}
Creatinine: 2 mg/dL — ABNORMAL LOW
Desalkylflurazepam: NOT DETECTED ng/mg{creat}
Desmethyldiazepam: NOT DETECTED ng/mg{creat}
Desmethylflunitrazepam: NOT DETECTED ng/mg{creat}
Diazepam: NOT DETECTED ng/mg{creat}
ETHYL ALCOHOL Enzymatic: NEGATIVE g/dL
FENTANYL: NEGATIVE
Fentanyl: NOT DETECTED ng/mg{creat}
Flunitrazepam: NOT DETECTED ng/mg{creat}
Lorazepam: NOT DETECTED ng/mg{creat}
METHADONE IA: NEGATIVE ng/mL
METHADONE MTB IA: NEGATIVE ng/mL
Midazolam: NOT DETECTED ng/mg{creat}
Norbuprenorphine: NOT DETECTED ng/mg{creat}
Norfentanyl: NOT DETECTED ng/mg{creat}
OPIATE CLASS IA: NEGATIVE ng/mL
OXYCODONE CLASS IA: NEGATIVE ng/mL
Oxazepam: NOT DETECTED ng/mg{creat}
PHENCYCLIDINE IA: NEGATIVE ng/mL
TAPENTADOL, IA: NEGATIVE ng/mL
TRAMADOL IA: NEGATIVE ng/mL
Temazepam: NOT DETECTED ng/mg{creat}

## 2024-12-05 LAB — CULTURE, BETA STREP (GROUP B ONLY): Strep Gp B Culture: NEGATIVE

## 2024-12-07 ENCOUNTER — Ambulatory Visit (HOSPITAL_BASED_OUTPATIENT_CLINIC_OR_DEPARTMENT_OTHER): Payer: MEDICAID | Admitting: Obstetrics and Gynecology

## 2024-12-07 ENCOUNTER — Ambulatory Visit: Payer: MEDICAID | Attending: Obstetrics and Gynecology | Admitting: *Deleted

## 2024-12-07 VITALS — BP 137/80 | HR 69

## 2024-12-07 DIAGNOSIS — O99320 Drug use complicating pregnancy, unspecified trimester: Secondary | ICD-10-CM | POA: Diagnosis not present

## 2024-12-07 DIAGNOSIS — F1121 Opioid dependence, in remission: Secondary | ICD-10-CM | POA: Diagnosis not present

## 2024-12-07 DIAGNOSIS — F1120 Opioid dependence, uncomplicated: Secondary | ICD-10-CM

## 2024-12-07 DIAGNOSIS — O365930 Maternal care for other known or suspected poor fetal growth, third trimester, not applicable or unspecified: Secondary | ICD-10-CM

## 2024-12-07 DIAGNOSIS — Z3A38 38 weeks gestation of pregnancy: Secondary | ICD-10-CM

## 2024-12-07 NOTE — Progress Notes (Addendum)
 Maternal-Fetal Medicine Consultation  Name: Monica Vazquez  MRN: [id number]  GA: [id number] [redacted]w[redacted]d   Fetal growth restriction.  On ultrasound performed 4 weeks ago, the estimated fetal weight was at the 6th percentile and the abdominal circumference measurement at the 2nd percentile.  Patient is on Suboxone  maintenance and takes Suboxone  8 mg sublingually twice daily.    Patient is here for NST.  NST is reactive.  I performed a fetal growth assessment and umbilical artery Doppler study.  The estimated fetal weight is at the 3rd percentile (2,433 g or 5 lbs 6 oz) and the abdominal circumference measurement at the 1st percentile.  Interval weight gain is 617 g over 4 weeks.  Amniotic fluid is decreased for this gestational age but no oligohydramnios is seen.  The AFI is 6 cm.  Umbilical artery Doppler showed normal forward diastolic flow.  Cephalic presentation.  I counseled the patient on ultrasound findings that is consistent with severe fetal growth restriction.  Pregnancy is complicated by severe fetal growth restriction is associated with an increased risk of perinatal mortality and morbidity.  Given that her gestational age is 56 weeks, I recommend a nonurgent delivery now. Patient has a prenatal visit appointment tomorrow.  I will be communicating with the provider by secure chat.  Recommendations -Delivery now.     Consultation including face-to-face (more than 50%) counseling 20 minutes.

## 2024-12-07 NOTE — Procedures (Signed)
 Monica Vazquez [birth year] [redacted]w[redacted]d  Fetus A Non-Stress Test Interpretation for 12/07/2024  Indication: smoker, Suboxone  use  Fetal Heart Rate A Mode: External Baseline Rate (A): 135 bpm Variability: Moderate Accelerations: 15 x 15 Decelerations: None Multiple birth?: No  Uterine Activity Mode: Palpation, Toco Contraction Frequency (min): none Resting Tone Palpated: Relaxed  Interpretation (Fetal Testing) Nonstress Test Interpretation: Reactive Overall Impression: Reassuring for gestational age Comments: Dr. Arna reviewed tracing

## 2024-12-08 ENCOUNTER — Ambulatory Visit (INDEPENDENT_AMBULATORY_CARE_PROVIDER_SITE_OTHER): Payer: MEDICAID | Admitting: Advanced Practice Midwife

## 2024-12-08 VITALS — BP 127/83 | HR 108 | Wt 203.2 lb

## 2024-12-08 DIAGNOSIS — R7689 Other specified abnormal immunological findings in serum: Secondary | ICD-10-CM | POA: Diagnosis not present

## 2024-12-08 DIAGNOSIS — O4103X0 Oligohydramnios, third trimester, not applicable or unspecified: Secondary | ICD-10-CM | POA: Diagnosis not present

## 2024-12-08 DIAGNOSIS — O0993 Supervision of high risk pregnancy, unspecified, third trimester: Secondary | ICD-10-CM

## 2024-12-08 DIAGNOSIS — O99323 Drug use complicating pregnancy, third trimester: Secondary | ICD-10-CM | POA: Diagnosis not present

## 2024-12-08 DIAGNOSIS — F1120 Opioid dependence, uncomplicated: Secondary | ICD-10-CM

## 2024-12-08 DIAGNOSIS — Z3A38 38 weeks gestation of pregnancy: Secondary | ICD-10-CM | POA: Diagnosis not present

## 2024-12-08 DIAGNOSIS — O365930 Maternal care for other known or suspected poor fetal growth, third trimester, not applicable or unspecified: Secondary | ICD-10-CM

## 2024-12-08 DIAGNOSIS — O0990 Supervision of high risk pregnancy, unspecified, unspecified trimester: Secondary | ICD-10-CM

## 2024-12-08 NOTE — Progress Notes (Signed)
 "  Subjective:  Monica Vazquez is a 29 y.o. G3P1011 at [redacted]w[redacted]d being seen today for ongoing prenatal care.  She is currently monitored for the following issues for this high-risk pregnancy and has Supervision of high risk pregnancy, antepartum; Suboxone  maintenance treatment complicating pregnancy, antepartum (HCC); Smoker; Anxiety; Rubella non-immune status, antepartum; Alpha thalassemia silent carrier; Opioid use disorder, moderate, in sustained remission (HCC); Hepatitis C antibody test positive-viral load negative; and IUGR (intrauterine growth restriction) affecting care of mother on their problem list.  Patient reports possible leaking small amount of fluid since yesterday. Was evaluated for LOF last week. Fern, pool negative. Has not been evaluated since that time.  Contractions: Not present. Vag. Bleeding: None.  Movement: Present.   Picked up 7-day prescription for Suboxone  last week. Difficult to have a conversation with pt about medication due to to tangential conversation and prioritizing addressing severe FGR, possible PROM.    The following portions of the patient's history were reviewed and updated as appropriate: allergies, current medications, past family history, past medical history, past social history, past surgical history and problem list. Problem list updated.  Objective:   Vitals:   12/08/24 1654  BP: 127/83  Pulse: (!) 108  Weight: 203 lb 4 oz (92.2 kg)    Fetal Status: Fetal Heart Rate (bpm): 148   Movement: Present     General:  Alert, oriented and cooperative. Patient is in no acute distress.  Skin: Skin is warm and dry. No rash noted.   Cardiovascular: Normal heart rate noted  Respiratory: Normal respiratory effort, no problems with respiration noted  Abdomen: Soft, gravid, appropriate for gestational age. Pain/Pressure: Absent     Pelvic: Vag. Bleeding: None     Cervical exam deferred        Extremities: Normal range of motion.  Edema: None  Mental Status:  Normal mood and affect. Normal behavior. Normal judgment and thought content.   Urinalysis:      PDMP reviewed during this encounter.   Last UDS: Lab Results  Component Value Date   CREATIUR 2 (L) 12/02/2024     US  12/07/24: The estimated fetal weight is at the 3rd percentile (2,433 g or 5 lbs 6 oz) and the abdominal circumference measurement at the 1st percentile.  Interval weight gain is 617 g over 4 weeks.  Amniotic fluid is decreased for this gestational age but no oligohydramnios is seen.  The AFI is 6 cm. NST reactive. Umbilical artery Doppler showed normal forward diastolic flow.  Cephalic presentation.  Assessment and Plan:  Pregnancy: G3P1011 at [redacted]w[redacted]d 1. Supervision of high risk pregnancy, antepartum (Primary)  2. Suboxone  maintenance treatment complicating pregnancy, antepartum (HCC) - Suspect pt not taking as prescribed. Did not leave urine for ToxAssure today despite request and creatinine unusually low on last week's Tox Assure. Suspect alteration of specimen. Will address after delivery.   3. Hepatitis C antibody test positive-viral load negative - No further management needed  4. Poor fetal growth affecting management of mother in third trimester, single or unspecified fetus - MFM and REACH providers recommend IOL ASAP due to severe FGR and now concern for PROM. Pt states she needs to get her belongings and make arrangements for childcare and expects to get to hospital at midnight. Report given to L&D providers and charge RN. Informed pt that staffing is tight so she might be assessed and monitored tonight and induced in the morning depending on PROM eval and fetal status.   5. [redacted] weeks gestation of  pregnancy   6. Low amniotic fluid level - Possible PROM. Sent to MAU for eval ASAP.   Term labor symptoms and general obstetric precautions including but not limited to vaginal bleeding, contractions, leaking of fluid and fetal movement were reviewed in detail with the  patient. Please refer to After Visit Summary for other counseling recommendations.   Return in about 2 weeks (around 12/22/2024) for REACH GYN.   Future Appointments  Date Time Provider Department Center  12/16/2024  3:15 PM [facility] HEALTH CLINICIAN [facility]  12/22/2024  3:15 PM Lola Donnice HERO, MD [facility]     Hadlyn Amero  Claudene, CNM 12/08/2024 7:15 PM Center for [facility] Healthcare Faculty Practice, [facility]  "

## 2024-12-08 NOTE — Progress Notes (Signed)
" ° °  Subjective:   Monica Vazquez is a 29 y.o. G3P1011 here today for ongoing prenatal care, OUD management, substance exposed newborn care.  Monica Vazquez reports feeling well, nervous but ready Monica Monica labor and delivery.  Monica Vazquez is 5 and she states remembering his extended newborn stay and having an otherwise uncomplicated newborn hospitalization with him.  Health Maintenance Due  Topic Date Due   HPV VACCINES (1 - 3-dose series) Never done   Pneumococcal Vaccine (1 of 2 - PCV) Never done   Hepatitis B Vaccines 19-59 Average Risk (1 of 3 - 19+ 3-dose series) Never done   Cervical Cancer Screening (Pap smear)  11/12/2023   Influenza Vaccine  07/03/2024   COVID-19 Vaccine (1 - 2025-26 season) Never done    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   Claustrophobia     Past Surgical History:  Procedure Laterality Date   implanon     TYMPANOMASTOIDECTOMY Right 09/12/2015   Procedure: RIGHT MODIFIED RADICAL TYMPANOMASTOIDECTOMY;  Surgeon: Daniel Moccasin, MD;  Location:  SUR[facility]ERY CENTER;  Service: ENT;  Laterality: Right;   WISDOM TOOTH EXTRACTION      The following portions of the patient's history were reviewed and updated as appropriate: allergies, current medications, past family history, past medical history, past social history, past surgical history and problem list.  Objective:  Monica Vazquez is well appearing with clear communication.  Per documentation, infant is IUGR. Assessment and Plan:  Monica Vazquez is accompanied by FOB for this visit.  We briefly reviewed IUGR and weight guidelines for admission to [facility] vs.NICU as well as feeding preferences, use of higher caloric density formula, thermoregulation and glucose homeostasis.  We discussed the anticipated newborn observation period and Eat, Sleep and Console management.  She is aware of Rock [employer] producer options during hospitalization.  She will  use [facility] in [place] for pediatric services and needs to enroll in [facility]. Substance exposed newborn pamphlet given with REACH consult contact information for non-emergent, non-medical needs that arise.  Will follow on MBU and at next REACH visit. Problem List Items Addressed This Visit       Other   Supervision of high risk pregnancy, antepartum - Primary   Suboxone  maintenance treatment complicating pregnancy, antepartum (HCC)   Hepatitis C antibody test positive-viral load negative   IUGR (intrauterine growth restriction) affecting care of mother   Other Visit Diagnoses       [redacted] weeks gestation of pregnancy           Routine preventative health maintenance measures emphasized. Please refer to After Visit Summary for other counseling recommendations.   No follow-ups on file.    Total face-to-face time with patient: 20 minutes.  Over 50% of encounter was spent on counseling and coordination of care.   Monica Vazquez, NNP-BC Neonatal Nurse Practitioner Substance Exposed Newborn Consult at [facility] [phone]  "

## 2024-12-09 NOTE — Addendum Note (Signed)
 Addended by: HONORE VERNELL BRAVO on: 12/09/2024 09:21 AM   Modules accepted: Orders

## 2024-12-10 ENCOUNTER — Encounter (HOSPITAL_COMMUNITY): Payer: Self-pay | Admitting: Obstetrics & Gynecology

## 2024-12-10 ENCOUNTER — Inpatient Hospital Stay (HOSPITAL_COMMUNITY)
Admission: AD | Admit: 2024-12-10 | Discharge: 2024-12-13 | DRG: 806 | Disposition: A | Payer: MEDICAID | Attending: Obstetrics & Gynecology | Admitting: Obstetrics & Gynecology

## 2024-12-10 DIAGNOSIS — O99334 Smoking (tobacco) complicating childbirth: Secondary | ICD-10-CM | POA: Diagnosis present

## 2024-12-10 DIAGNOSIS — F1120 Opioid dependence, uncomplicated: Principal | ICD-10-CM

## 2024-12-10 DIAGNOSIS — F1190 Opioid use, unspecified, uncomplicated: Secondary | ICD-10-CM | POA: Diagnosis present

## 2024-12-10 DIAGNOSIS — O365990 Maternal care for other known or suspected poor fetal growth, unspecified trimester, not applicable or unspecified: Secondary | ICD-10-CM | POA: Diagnosis present

## 2024-12-10 DIAGNOSIS — O4290 Premature rupture of membranes, unspecified as to length of time between rupture and onset of labor, unspecified weeks of gestation: Principal | ICD-10-CM | POA: Diagnosis present

## 2024-12-10 DIAGNOSIS — R7689 Other specified abnormal immunological findings in serum: Secondary | ICD-10-CM | POA: Diagnosis present

## 2024-12-10 DIAGNOSIS — F1121 Opioid dependence, in remission: Secondary | ICD-10-CM

## 2024-12-10 DIAGNOSIS — Z8249 Family history of ischemic heart disease and other diseases of the circulatory system: Secondary | ICD-10-CM

## 2024-12-10 DIAGNOSIS — Z3A38 38 weeks gestation of pregnancy: Secondary | ICD-10-CM

## 2024-12-10 DIAGNOSIS — Z2839 Other underimmunization status: Secondary | ICD-10-CM

## 2024-12-10 DIAGNOSIS — O99324 Drug use complicating childbirth: Secondary | ICD-10-CM | POA: Diagnosis present

## 2024-12-10 DIAGNOSIS — F1490 Cocaine use, unspecified, uncomplicated: Secondary | ICD-10-CM | POA: Diagnosis present

## 2024-12-10 DIAGNOSIS — F17210 Nicotine dependence, cigarettes, uncomplicated: Secondary | ICD-10-CM | POA: Diagnosis present

## 2024-12-10 DIAGNOSIS — O365930 Maternal care for other known or suspected poor fetal growth, third trimester, not applicable or unspecified: Secondary | ICD-10-CM | POA: Diagnosis present

## 2024-12-10 DIAGNOSIS — Z148 Genetic carrier of other disease: Secondary | ICD-10-CM

## 2024-12-10 DIAGNOSIS — D563 Thalassemia minor: Secondary | ICD-10-CM | POA: Diagnosis present

## 2024-12-10 DIAGNOSIS — O4292 Full-term premature rupture of membranes, unspecified as to length of time between rupture and onset of labor: Principal | ICD-10-CM | POA: Diagnosis present

## 2024-12-10 DIAGNOSIS — O9842 Viral hepatitis complicating childbirth: Secondary | ICD-10-CM | POA: Diagnosis present

## 2024-12-10 DIAGNOSIS — B1920 Unspecified viral hepatitis C without hepatic coma: Secondary | ICD-10-CM | POA: Diagnosis present

## 2024-12-10 DIAGNOSIS — O99824 Streptococcus B carrier state complicating childbirth: Secondary | ICD-10-CM | POA: Diagnosis present

## 2024-12-10 LAB — POCT FERN TEST: POCT Fern Test: NEGATIVE

## 2024-12-10 LAB — RUPTURE OF MEMBRANE (ROM)PLUS: Rom Plus: POSITIVE

## 2024-12-10 MED ORDER — PANTOPRAZOLE SODIUM 40 MG PO TBEC
40.0000 mg | DELAYED_RELEASE_TABLET | Freq: Every day | ORAL | Status: DC
  Administered 2024-12-11: 40 mg via ORAL
  Filled 2024-12-10: qty 1
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
## 2024-12-10 NOTE — MAU Note (Signed)
 Monica Vazquez is a 29 y.o. at [redacted]w[redacted]d here in MAU reporting cramping all day today. She has been leaking fld since yesterday. States fld is clear. She states she was for IOL 2 days ago but BS was full. Dr Lola told her yesterday to come in for monitoring - baby has IUGR. Pt was 2cm on 12/30  LMP: na Onset of complaint: yesterday Pain score: 4 Vitals:   12/10/24 2136 12/10/24 2140  BP:  118/69  Pulse: 76   Resp: 19   Temp: 97.7 F (36.5 C)   SpO2: 98%      FHT: 128  Lab orders placed from triage: labor eval

## 2024-12-11 ENCOUNTER — Encounter (HOSPITAL_COMMUNITY): Payer: Self-pay | Admitting: Obstetrics & Gynecology

## 2024-12-11 ENCOUNTER — Inpatient Hospital Stay (HOSPITAL_COMMUNITY): Payer: MEDICAID | Admitting: Anesthesiology

## 2024-12-11 ENCOUNTER — Other Ambulatory Visit: Payer: Self-pay

## 2024-12-11 DIAGNOSIS — O99344 Other mental disorders complicating childbirth: Secondary | ICD-10-CM | POA: Diagnosis not present

## 2024-12-11 DIAGNOSIS — F17210 Nicotine dependence, cigarettes, uncomplicated: Secondary | ICD-10-CM | POA: Diagnosis present

## 2024-12-11 DIAGNOSIS — O4290 Premature rupture of membranes, unspecified as to length of time between rupture and onset of labor, unspecified weeks of gestation: Principal | ICD-10-CM | POA: Diagnosis present

## 2024-12-11 DIAGNOSIS — Z148 Genetic carrier of other disease: Secondary | ICD-10-CM | POA: Diagnosis not present

## 2024-12-11 DIAGNOSIS — F1490 Cocaine use, unspecified, uncomplicated: Secondary | ICD-10-CM | POA: Diagnosis present

## 2024-12-11 DIAGNOSIS — Z8249 Family history of ischemic heart disease and other diseases of the circulatory system: Secondary | ICD-10-CM | POA: Diagnosis not present

## 2024-12-11 DIAGNOSIS — O99824 Streptococcus B carrier state complicating childbirth: Secondary | ICD-10-CM | POA: Diagnosis present

## 2024-12-11 DIAGNOSIS — O4202 Full-term premature rupture of membranes, onset of labor within 24 hours of rupture: Secondary | ICD-10-CM | POA: Diagnosis not present

## 2024-12-11 DIAGNOSIS — Z3A38 38 weeks gestation of pregnancy: Secondary | ICD-10-CM

## 2024-12-11 DIAGNOSIS — O99820 Streptococcus B carrier state complicating pregnancy: Secondary | ICD-10-CM | POA: Diagnosis not present

## 2024-12-11 DIAGNOSIS — O99334 Smoking (tobacco) complicating childbirth: Secondary | ICD-10-CM | POA: Diagnosis not present

## 2024-12-11 DIAGNOSIS — O9842 Viral hepatitis complicating childbirth: Secondary | ICD-10-CM | POA: Diagnosis present

## 2024-12-11 DIAGNOSIS — O4292 Full-term premature rupture of membranes, unspecified as to length of time between rupture and onset of labor: Secondary | ICD-10-CM | POA: Diagnosis present

## 2024-12-11 DIAGNOSIS — O99324 Drug use complicating childbirth: Secondary | ICD-10-CM | POA: Diagnosis present

## 2024-12-11 DIAGNOSIS — O365930 Maternal care for other known or suspected poor fetal growth, third trimester, not applicable or unspecified: Secondary | ICD-10-CM | POA: Diagnosis not present

## 2024-12-11 DIAGNOSIS — F1410 Cocaine abuse, uncomplicated: Secondary | ICD-10-CM | POA: Diagnosis not present

## 2024-12-11 DIAGNOSIS — O26893 Other specified pregnancy related conditions, third trimester: Secondary | ICD-10-CM | POA: Diagnosis present

## 2024-12-11 DIAGNOSIS — F1190 Opioid use, unspecified, uncomplicated: Secondary | ICD-10-CM | POA: Diagnosis present

## 2024-12-11 DIAGNOSIS — B1920 Unspecified viral hepatitis C without hepatic coma: Secondary | ICD-10-CM | POA: Diagnosis present

## 2024-12-11 LAB — URINE DRUG SCREEN
Amphetamines: NEGATIVE
Barbiturates: NEGATIVE
Benzodiazepines: NEGATIVE
Cocaine: POSITIVE — AB
Fentanyl: POSITIVE — AB
Methadone Scn, Ur: NEGATIVE
Opiates: NEGATIVE
Tetrahydrocannabinol: POSITIVE — AB

## 2024-12-11 LAB — COMPREHENSIVE METABOLIC PANEL WITH GFR
ALT: 24 U/L (ref 0–44)
AST: 31 U/L (ref 15–41)
Albumin: 2.8 g/dL — ABNORMAL LOW (ref 3.5–5.0)
Alkaline Phosphatase: 159 U/L — ABNORMAL HIGH (ref 38–126)
Anion gap: 10 (ref 5–15)
BUN: 10 mg/dL (ref 6–20)
CO2: 20 mmol/L — ABNORMAL LOW (ref 22–32)
Calcium: 7.9 mg/dL — ABNORMAL LOW (ref 8.9–10.3)
Chloride: 108 mmol/L (ref 98–111)
Creatinine, Ser: 0.62 mg/dL (ref 0.44–1.00)
GFR, Estimated: >60 mL/min
Glucose, Bld: 79 mg/dL (ref 70–99)
Potassium: 3.6 mmol/L (ref 3.5–5.1)
Sodium: 138 mmol/L (ref 135–145)
Total Bilirubin: 0.2 mg/dL (ref 0.0–1.2)
Total Protein: 5 g/dL — ABNORMAL LOW (ref 6.5–8.1)

## 2024-12-11 LAB — CBC
HCT: 34.9 % — ABNORMAL LOW (ref 36.0–46.0)
Hemoglobin: 11.3 g/dL — ABNORMAL LOW (ref 12.0–15.0)
MCH: 27.5 pg (ref 26.0–34.0)
MCHC: 32.4 g/dL (ref 30.0–36.0)
MCV: 84.9 fL (ref 80.0–100.0)
Platelets: 187 K/uL (ref 150–400)
RBC: 4.11 MIL/uL (ref 3.87–5.11)
RDW: 14.1 % (ref 11.5–15.5)
WBC: 10.7 K/uL — ABNORMAL HIGH (ref 4.0–10.5)
nRBC: 0 % (ref 0.0–0.2)

## 2024-12-11 LAB — SYPHILIS: RPR W/REFLEX TO RPR TITER AND TREPONEMAL ANTIBODIES, TRADITIONAL SCREENING AND DIAGNOSIS ALGORITHM: RPR Ser Ql: NONREACTIVE

## 2024-12-11 LAB — TYPE AND SCREEN
ABO/RH(D): A POS
Antibody Screen: NEGATIVE

## 2024-12-11 MED ORDER — DIPHENHYDRAMINE HCL 25 MG PO CAPS: 25.0000 mg | ORAL_CAPSULE | Freq: Four times a day (QID) | ORAL | Status: DC | PRN

## 2024-12-11 MED ORDER — LIDOCAINE HCL (PF) 1 % IJ SOLN: 30.0000 mL | INTRAMUSCULAR | Status: DC | PRN

## 2024-12-11 MED ORDER — ONDANSETRON HCL 4 MG/2ML IJ SOLN: 4.0000 mg | INTRAMUSCULAR | Status: DC | PRN

## 2024-12-11 MED ORDER — FLEET ENEMA RE ENEM: 1.0000 | ENEMA | RECTAL | Status: DC | PRN

## 2024-12-11 MED ORDER — ZOLPIDEM TARTRATE 5 MG PO TABS: 5.0000 mg | ORAL_TABLET | Freq: Every evening | ORAL | Status: DC | PRN

## 2024-12-11 MED ORDER — SENNOSIDES-DOCUSATE SODIUM 8.6-50 MG PO TABS
2.0000 | ORAL_TABLET | Freq: Every day | ORAL | Status: DC
  Administered 2024-12-12 – 2024-12-13 (×2): 2 via ORAL
  Filled 2024-12-11 (×2): qty 2

## 2024-12-11 MED ORDER — OXYTOCIN BOLUS FROM INFUSION
333.0000 mL | Freq: Once | INTRAVENOUS | Status: AC
  Administered 2024-12-11: 333 mL via INTRAVENOUS

## 2024-12-11 MED ORDER — OXYCODONE-ACETAMINOPHEN 5-325 MG PO TABS: 1.0000 | ORAL_TABLET | ORAL | Status: DC | PRN

## 2024-12-11 MED ORDER — TERBUTALINE SULFATE 1 MG/ML IJ SOLN: 0.2500 mg | Freq: Once | INTRAMUSCULAR | Status: DC | PRN

## 2024-12-11 MED ORDER — EPHEDRINE 5 MG/ML INJ: 10.0000 mg | INTRAVENOUS | Status: DC | PRN

## 2024-12-11 MED ORDER — OXYTOCIN-SODIUM CHLORIDE 30-0.9 UT/500ML-% IV SOLN
2.5000 [IU]/h | INTRAVENOUS | Status: DC
  Filled 2024-12-11: qty 500

## 2024-12-11 MED ORDER — ONDANSETRON HCL 4 MG PO TABS: 4.0000 mg | ORAL_TABLET | ORAL | Status: DC | PRN

## 2024-12-11 MED ORDER — COCONUT OIL OIL: 1.0000 | TOPICAL_OIL | Status: DC | PRN

## 2024-12-11 MED ORDER — ONDANSETRON HCL 4 MG/2ML IJ SOLN: 4.0000 mg | Freq: Four times a day (QID) | INTRAMUSCULAR | Status: DC | PRN

## 2024-12-11 MED ORDER — FENTANYL-BUPIVACAINE-NACL 0.5-0.125-0.9 MG/250ML-% EP SOLN
12.0000 mL/h | EPIDURAL | Status: DC | PRN
  Administered 2024-12-11: 12 mL/h via EPIDURAL
  Filled 2024-12-11: qty 250

## 2024-12-11 MED ORDER — OXYTOCIN-SODIUM CHLORIDE 30-0.9 UT/500ML-% IV SOLN
1.0000 m[IU]/min | INTRAVENOUS | Status: DC
  Administered 2024-12-11: 2 m[IU]/min via INTRAVENOUS

## 2024-12-11 MED ORDER — DIBUCAINE (PERIANAL) 1 % EX OINT: 1.0000 | TOPICAL_OINTMENT | CUTANEOUS | Status: DC | PRN

## 2024-12-11 MED ORDER — LACTATED RINGERS IV SOLN: INTRAVENOUS | Status: DC

## 2024-12-11 MED ORDER — LACTATED RINGERS IV SOLN
500.0000 mL | INTRAVENOUS | Status: DC | PRN
  Administered 2024-12-11: 500 mL via INTRAVENOUS

## 2024-12-11 MED ORDER — BENZOCAINE-MENTHOL 20-0.5 % EX AERO
1.0000 | INHALATION_SPRAY | CUTANEOUS | Status: DC | PRN
  Administered 2024-12-12: 1 via TOPICAL
  Filled 2024-12-11: qty 56

## 2024-12-11 MED ORDER — BUPRENORPHINE HCL-NALOXONE HCL 8-2 MG SL SUBL
1.0000 | SUBLINGUAL_TABLET | Freq: Two times a day (BID) | SUBLINGUAL | Status: DC
  Administered 2024-12-11 – 2024-12-13 (×7): 1 via SUBLINGUAL
  Filled 2024-12-11 (×7): qty 1

## 2024-12-11 MED ORDER — IBUPROFEN 600 MG PO TABS
600.0000 mg | ORAL_TABLET | Freq: Four times a day (QID) | ORAL | Status: DC
  Administered 2024-12-11 – 2024-12-13 (×9): 600 mg via ORAL
  Filled 2024-12-11 (×9): qty 1

## 2024-12-11 MED ORDER — WITCH HAZEL-GLYCERIN EX PADS: 1.0000 | MEDICATED_PAD | CUTANEOUS | Status: DC | PRN

## 2024-12-11 MED ORDER — OXYCODONE-ACETAMINOPHEN 5-325 MG PO TABS: 2.0000 | ORAL_TABLET | ORAL | Status: DC | PRN

## 2024-12-11 MED ORDER — LACTATED RINGERS IV SOLN
500.0000 mL | Freq: Once | INTRAVENOUS | Status: AC
  Administered 2024-12-11: 500 mL via INTRAVENOUS

## 2024-12-11 MED ORDER — ACETAMINOPHEN 325 MG PO TABS: 650.0000 mg | ORAL_TABLET | Freq: Four times a day (QID) | ORAL | Status: DC | PRN

## 2024-12-11 MED ORDER — SODIUM CHLORIDE 0.9 % IV SOLN
INTRAVENOUS | Status: DC | PRN
  Administered 2024-12-11: 6 mL via EPIDURAL

## 2024-12-11 MED ORDER — PHENYLEPHRINE 80 MCG/ML (10ML) SYRINGE FOR IV PUSH (FOR BLOOD PRESSURE SUPPORT): 80.0000 ug | PREFILLED_SYRINGE | INTRAVENOUS | Status: DC | PRN

## 2024-12-11 MED ORDER — SIMETHICONE 80 MG PO CHEW: 80.0000 mg | CHEWABLE_TABLET | ORAL | Status: DC | PRN

## 2024-12-11 MED ORDER — PHENYLEPHRINE 80 MCG/ML (10ML) SYRINGE FOR IV PUSH (FOR BLOOD PRESSURE SUPPORT)
80.0000 ug | PREFILLED_SYRINGE | INTRAVENOUS | Status: DC | PRN
  Filled 2024-12-11: qty 10

## 2024-12-11 MED ORDER — PRENATAL MULTIVITAMIN CH
1.0000 | ORAL_TABLET | Freq: Every day | ORAL | Status: DC
  Administered 2024-12-12 – 2024-12-13 (×2): 1 via ORAL
  Filled 2024-12-11 (×2): qty 1

## 2024-12-11 MED ORDER — TETANUS-DIPHTH-ACELL PERTUSSIS 5-2-15.5 LF-MCG/0.5 IM SUSP: 0.5000 mL | Freq: Once | INTRAMUSCULAR | Status: DC

## 2024-12-11 MED ORDER — HYDROXYZINE HCL 50 MG PO TABS: 50.0000 mg | ORAL_TABLET | Freq: Four times a day (QID) | ORAL | Status: DC | PRN

## 2024-12-11 MED ORDER — DIPHENHYDRAMINE HCL 50 MG/ML IJ SOLN: 12.5000 mg | INTRAMUSCULAR | Status: DC | PRN

## 2024-12-11 MED ORDER — LIDOCAINE HCL (PF) 1 % IJ SOLN
INTRAMUSCULAR | Status: DC | PRN
  Administered 2024-12-11: 3 mL via SUBCUTANEOUS

## 2024-12-11 MED ORDER — FENTANYL CITRATE (PF) 100 MCG/2ML IJ SOLN: 100.0000 ug | INTRAMUSCULAR | Status: DC | PRN

## 2024-12-11 MED ORDER — LIDOCAINE-EPINEPHRINE (PF) 2 %-1:200000 IJ SOLN
INTRAMUSCULAR | Status: DC | PRN
  Administered 2024-12-11: 3 mL via EPIDURAL

## 2024-12-11 MED ORDER — ACETAMINOPHEN 325 MG PO TABS: 650.0000 mg | ORAL_TABLET | ORAL | Status: DC | PRN

## 2024-12-11 MED ORDER — SOD CITRATE-CITRIC ACID 500-334 MG/5ML PO SOLN: 30.0000 mL | ORAL | Status: DC | PRN

## 2024-12-11 NOTE — Progress Notes (Signed)
 VSS, pt not having any contractions.  FHR Cat 1.  Discussed IOL vs expectant mgt, with IOL recommended d/t ROM for 12 hours now w/o labor. Pt agreeable.  Cx favorable, will start pitocin 

## 2024-12-11 NOTE — MAU Note (Signed)
 Pt informed that the ultrasound is considered a limited OB ultrasound and is not intended to be a complete ultrasound exam.  Patient also informed that the ultrasound is not being completed with the intent of assessing for fetal or placental anomalies or any pelvic abnormalities.  Explained that the purpose of today's ultrasound is to assess for  presentation..  Patient acknowledges the purpose of the exam and the limitations of the study.     Verified vertex by RN

## 2024-12-11 NOTE — Progress Notes (Signed)
 Patient ID: Monica Vazquez, female   DOB: Jan 09, 1996, 29 y.o.   MRN: [id number]  Was in to meet patient earlier prior to epidural placement; now she is fully comfortable without concerns; of note, she reported to anesthesia that she used cocaine  a couple days ago  BPs 122/73, 126/74 FHR 120s, +accels, no decels Ctx q 2 mins with Pit at 56mu/min Cx 5/80/vtx -2; forebag ruptured for thick MSF  IUP@38 .4wks PROM~20hrs, but with intact forebag Early active labor  -Keep ctx reg with Pit; may be able to back off since she is fully ruptured now -Anticipate vag delivery -UDS pending  Monica Vazquez [facility] 12/11/2024

## 2024-12-11 NOTE — Anesthesia Procedure Notes (Signed)
 Epidural Patient location during procedure: OB Start time: 12/11/2024 9:33 AM End time: 12/11/2024 9:36 AM  Staffing Anesthesiologist: Boone Fess, MD Performed: anesthesiologist   Preanesthetic Checklist Completed: patient identified, IV checked, site marked, risks and benefits discussed, surgical consent, monitors and equipment checked, pre-op evaluation and timeout performed  Epidural Patient position: sitting Prep: ChloraPrep Patient monitoring: heart rate, continuous pulse ox and blood pressure Approach: midline Location: L3-L4 Injection technique: LOR saline  Needle:  Needle type: Tuohy  Needle gauge: 17 G Needle length: 9 cm Needle insertion depth: 6 cm Catheter type: closed end flexible Catheter size: 19 Gauge Catheter at skin depth: 11 cm Test dose: negative and 1.5% lidocaine  with Epi 1:200 K  Assessment Sensory level: T10 Events: blood not aspirated, no cerebrospinal fluid, injection not painful, no injection resistance, no paresthesia and negative IV test  Additional Notes First/one attempt Pt. Evaluated and documentation done after procedure finished. Patient identified. Risks/Benefits/Options discussed with patient including but not limited to bleeding, infection, nerve damage, paralysis, failed block, incomplete pain control, headache, blood pressure changes, nausea, vomiting, reactions to medication both or allergic, itching and postpartum back pain. Confirmed with bedside nurse the patient's most recent platelet count. Confirmed with patient that they are not currently taking any anticoagulation, have any bleeding history or any family history of bleeding disorders. Patient expressed understanding and wished to proceed. All questions were answered. Sterile technique was used throughout the entire procedure. Please see nursing notes for vital signs. Test dose was given through epidural catheter and negative prior to continuing to dose epidural or start infusion.  Warning signs of high block given to the patient including shortness of breath, tingling/numbness in hands, complete motor block, or any concerning symptoms with instructions to call for help. Patient was given instructions on fall risk and not to get out of bed. All questions and concerns addressed with instructions to call with any issues or inadequate analgesia.     Patient tolerated the insertion well without immediate complications.  Reason for block: procedure for painReason for block:procedure for pain

## 2024-12-11 NOTE — Anesthesia Preprocedure Evaluation (Signed)
"                                    Anesthesia Evaluation  Patient identified by MRN, date of birth, ID band Patient awake    Reviewed: Allergy & Precautions, NPO status , Patient's Chart, lab work & pertinent test results  History of Anesthesia Complications Negative for: history of anesthetic complications  Airway Mallampati: II  TM Distance: >3 FB Neck ROM: Full    Dental no notable dental hx. (+) Teeth Intact   Pulmonary neg sleep apnea, neg COPD, Current SmokerPatient did not abstain from smoking.   Pulmonary exam normal breath sounds clear to auscultation       Cardiovascular Exercise Tolerance: Good METS(-) hypertension(-) CAD and (-) Past MI negative cardio ROS (-) dysrhythmias  Rhythm:Regular Rate:Normal - Systolic murmurs    Neuro/Psych  PSYCHIATRIC DISORDERS Anxiety     negative neurological ROS     GI/Hepatic ,neg GERD  ,,(+)     substance abuse  cocaine  use and marijuana useLast cocaine  use a few days ago. Last marijuana use a month ago. On suboxone    Endo/Other  neg diabetes    Renal/GU negative Renal ROS     Musculoskeletal   Abdominal   Peds  Hematology Denies blood thinner use or bleeding disorders.    Anesthesia Other Findings Denies blood thinner use or bleeding diatheses. Recent labs reviewed. Past Medical History: No date: ADHD (attention deficit hyperactivity disorder) 09/2015: Cholesteatoma of right ear No date: Claustrophobia   Reproductive/Obstetrics (+) Pregnancy                              Anesthesia Physical Anesthesia Plan  ASA: 2  Anesthesia Plan: Epidural   Post-op Pain Management:    Induction:   PONV Risk Score and Plan: 1 and Treatment may vary due to age or medical condition and Ondansetron   Airway Management Planned: Natural Airway  Additional Equipment:   Intra-op Plan:   Post-operative Plan:   Informed Consent: I have reviewed the patients History and Physical,  chart, labs and discussed the procedure including the risks, benefits and alternatives for the proposed anesthesia with the patient or authorized representative who has indicated his/her understanding and acceptance.       Plan Discussed with: Surgeon  Anesthesia Plan Comments: (Discussed R/B/A of neuraxial anesthesia technique with patient: - rare risks of spinal/epidural hematoma, nerve damage, infection - Risk of PDPH - Risk of itching - Risk of nausea and vomiting - Risk of poor block necessitating replacement of epidural. - Risk of allergic reactions. Patient voiced understanding. Patient counseled on benefits of smoking cessation, and increased perioperative risks associated with continued smoking. )        Anesthesia Quick Evaluation  "

## 2024-12-11 NOTE — Lactation Note (Signed)
 This note was copied from a baby's chart. Lactation Consultation Note  Patient Name: Monica Vazquez Unijb'd Date: 12/11/2024 Age:27 hours  [name] informed LC she has decided to formula feed only.   Maternal Data    Feeding Nipple Type: Extra Slow Flow  LATCH Score                    Lactation Tools Discussed/Used    Interventions    Discharge    Consult Status      Grayce LULLA Batter 12/11/2024, 4:14 PM

## 2024-12-11 NOTE — H&P (Signed)
 OBSTETRIC ADMISSION HISTORY AND PHYSICAL  Monica Vazquez is a 29 y.o. female G3P1011 with IUP at [redacted]w[redacted]d by 8 wk U/S presenting for leaking fluid since Monday 12/07/2024 and contractions all day. She was scheduled for IOL 2 days, but was not called to come in since L&D was full.   Reports fetal movement. Denies vaginal bleeding.  She received her prenatal [facility] at [facility]  Support person in labor: FOB  Ultrasounds Anatomy U/S: Normal anatomy - EFW: 1816 gm 4 lb 6 %   Prenatal History/Complications: IUGR  Substance Use Disorder Smoker Cocaine  Use in Pregnancy GBS Positive  OB BOX: NURSING  PROVIDER  Conservator, [employer] Dating by U/S at 8 wks  PNC Model REACH Anatomy U/S IUGR  Initiated care at  [facility]  English              LAB RESULTS   Support Person  Genetics NIPS: low risk AFP: normal    NT/IT (FT only)     Carrier Screen Horizon: silent alpha thal  Rhogam  A/Positive/-- (07/08 1600) A1C/GTT Early HgbA1C: normal Third trimester 2 hr GTT:   Flu Vaccine     TDaP Vaccine   Blood Type A/Positive/-- (07/08 1600)  RSV Vaccine  Antibody Negative (07/08 1600)  COVID Vaccine  Rubella 1.05 (07/08 1600)  Feeding Plan  RPR Non Reactive (07/08 1600)  Contraception  HBsAg Negative (07/08 1600)  Circumcision  HIV Non Reactive (07/08 1600)  Pediatrician   HCVAb Reactive (07/08 1600)  Prenatal Classes     BTL Consent  Pap Diagnosis  Date Value Ref Range Status  11/11/2020 (A)  Final   - Atypical squamous cells of undetermined significance (ASC-US )  [ ]  NEEDS REPEAT PP  BTL Pre-payment  GC/CT Initial:  neg/neg 36wks:    VBAC Consent  GBS   For PCN allergy, check sensitivities   BRx Optimized? [ ]  yes   [ ]  no    DME Rx [ ]  BP cuff [ ]  Weight Scale Waterbirth  [ ]  Class [ ]  Consent [ ]  CNM visit  PHQ9 & GAD7 [  ] new OB [  ] 28 weeks  [  ] 36 weeks Induction  [ ]  Orders Entered [ ] Foley Y/N   Past Medical History: Past Medical  History:  Diagnosis Date   ADHD (attention deficit hyperactivity disorder)    Cholesteatoma of right ear 09/2015   Claustrophobia     Past Surgical History: Past Surgical History:  Procedure Laterality Date   implanon     TYMPANOMASTOIDECTOMY Right 09/12/2015   Procedure: RIGHT MODIFIED RADICAL TYMPANOMASTOIDECTOMY;  Surgeon: Daniel Moccasin, MD;  Location: [facility];  Service: ENT;  Laterality: Right;   WISDOM TOOTH EXTRACTION      Obstetrical History: OB History     Gravida  3   Para  1   Term  1   Preterm  0   AB  1   Living  1      SAB  1   IAB  0   Ectopic  0   Multiple  0   Live Births  1           Social History: Social History   Socioeconomic History   Marital status: Single    Spouse name: Not on file   Number of children: Not on file   Years of  education: Not on file   Highest education level: Not on file  Occupational History   Not on file  Tobacco Use   Smoking status: Every Day    Current packs/day: 0.00    Types: Cigarettes   Smokeless tobacco: Never   Tobacco comments:    smokes 3-4 cig daily  Vaping Use   Vaping status: Former   Substances: Nicotine  Substance and Sexual Activity   Alcohol use: No   Drug use: Yes    Types: Oxycodone , Cocaine , Marijuana    Comment: Suboxone  2025 12/10/2024 only marijuana and suboxone    Sexual activity: Yes    Birth control/protection: None  Other Topics Concern   Not on file  Social History Narrative   Both parents are incarcerated; maternal grandmother is legal guardian - to bring documentation of guardianship DOS.  States pt. lives with boyfriend; grandmother advised that pt. will need to go home to grandmother's house DOS.   Social Drivers of Health   Tobacco Use: High Risk (12/10/2024)   Patient History    Smoking Tobacco Use: Every Day    Smokeless Tobacco Use: Never    Passive Exposure: Not on file  Financial Resource Strain: Not on file  Food Insecurity: Food  Insecurity Present (01/29/2023)   Hunger Vital Sign    Worried About Running Out of Food in the Last Year: Sometimes true    Ran Out of Food in the Last Year: Sometimes true  Transportation Needs: Unmet Transportation Needs (01/29/2023)   PRAPARE - Administrator, Civil Service (Medical): Yes    Lack of Transportation (Non-Medical): Yes  Physical Activity: Not on file  Stress: Not on file  Social Connections: Not on file  Depression (PHQ2-9): Medium Risk (06/09/2024)   Depression (PHQ2-9)    PHQ-2 Score: 9  Alcohol Screen: Not on file  Housing: Not on file  Utilities: Not on file  Health Literacy: Not on file    Family History: Family History  Problem Relation Age of Onset   Hypertension Father    Heart disease Father     Allergies: Allergies[1]  Medications Prior to Admission  Medication Sig Dispense Refill Last Dose/Taking   magnesium  hydroxide (MILK OF MAGNESIA) 400 MG/5ML suspension Take 30 mLs by mouth daily as needed for mild constipation. 355 mL 2 Past Month   ondansetron  (ZOFRAN -ODT) 8 MG disintegrating tablet Take 1 tablet (8 mg total) by mouth every 8 (eight) hours as needed for nausea. 50 tablet 3 12/09/2024   Prenatal MV & Min w/FA-DHA (PRENATAL GUMMIES PO) Take by mouth.   12/10/2024   SUBOXONE  8-2 MG FILM Place 1 Film under the tongue 2 (two) times daily. 14 Film 0 12/10/2024   naloxone  (NARCAN ) nasal spray 4 mg/0.1 mL Use in case of overdose 1 each 1      Review of Systems  All systems reviewed and negative except as stated in HPI  Blood pressure 124/66, pulse 65, temperature 97.7 F (36.5 C), resp. rate 19, height 5' 7 (1.702 m), weight 90.5 kg, last menstrual period 06/05/2023, SpO2 98%. General appearance: alert and cooperative Lungs: no respiratory distress Heart: regular rate  Abdomen: soft, non-tender; gravid Pelvic: adequate Extremities: Homans sign is negative, no sign of DVT Presentation: cephalic Fetal monitoring: Baseline rate 125 bpm    Variability moderate  Accelerations present   Decelerations none Uterine activity: UI noted  Dilation: 3 Exam by:: Ala Cart, CNM  Prenatal labs: ABO, Rh: A/Positive/-- (07/08 1600) Antibody: Negative (07/08 1600)  Rubella: 1.05 (07/08 1600) RPR: Non Reactive (07/08 1600)  HBsAg: Negative (07/08 1600)  HIV: Non Reactive (07/08 1600)  GBS: Negative/-- (12/30 1718)  Glucola: not done Genetic screening:  low-risk  Prenatal Transfer Tool  Maternal Diabetes: Unknown - GTT not done, no prior h/o DM Genetic Screening: Normal Maternal Ultrasounds/Referrals: IUGR Fetal Ultrasounds or other Referrals:  None Maternal Substance Abuse:  Yes:  Type: Smoker Significant Maternal Medications:  Meds include: Other: Suboxone , (+) Cocaine  use early in pregnancy Significant Maternal Lab Results: Group B Strep positive  Results for orders placed or performed during the hospital encounter of 12/10/24 (from the past 24 hours)  POCT fern test   Collection Time: 12/10/24 10:09 PM  Result Value Ref Range   POCT Fern Test Negative = intact amniotic membranes   Rupture of Membrane (ROM) Plus   Collection Time: 12/10/24 11:04 PM  Result Value Ref Range   Rom Plus POSITIVE     Patient Active Problem List   Diagnosis Date Noted   IUGR (intrauterine growth restriction) affecting care of mother 11/06/2024   Hepatitis C antibody test positive-viral load negative 07/07/2024   Opioid use disorder, moderate, in sustained remission (HCC) 05/10/2021   Alpha thalassemia silent carrier 11/09/2020   Rubella non-immune status, antepartum 10/22/2020   Smoker 10/21/2020   Anxiety 10/21/2020   Suboxone  maintenance treatment complicating pregnancy, antepartum (HCC) 10/20/2020   Supervision of high risk pregnancy, antepartum 10/18/2020    Assessment/Plan:  SIRIYAH AMBROSIUS is a 29 y.o. G3P1011 at [redacted]w[redacted]d here for PROM  Labor: early -- pain control: planning epidural  Fetal Wellbeing: Cephalic by U/S done by  Oleh Loges, RN.  -- GBS (Positive) -- continuous fetal monitoring - category 1   Postpartum Planning -- bottle -- Unsure for contraception    Ala Cart, CNM  12/11/2024, 12:10 AM      [1]  Allergies Allergen Reactions   Hydrocodone  Itching    SKIN BURNING. Pt states she can tolerate oxycodone 

## 2024-12-11 NOTE — Discharge Summary (Signed)
 "    Postpartum Discharge Summary  Date of Service updated***     Patient Name: Vazquez Vazquez DOB: March 03, 1996 MRN: [id number]  Date of admission: 12/10/2024 Delivery date:12/11/2024 Delivering provider: ANEITA MONICO Vazquez Date of discharge: 12/11/2024  Admitting diagnosis: Ruptured membranes, prolonged [O42.90] Indication for care in labor or delivery [O75.9] Intrauterine pregnancy: [redacted]w[redacted]d     Secondary diagnosis:  Principal Problem:   Ruptured membranes, prolonged Active Problems:   Suboxone  maintenance treatment complicating pregnancy, antepartum (HCC)   Rubella non-immune status, antepartum   Alpha thalassemia silent carrier   Hepatitis C antibody test positive-viral load negative   IUGR (intrauterine growth restriction) affecting care of mother   Indication for care in labor or delivery  Additional problems: none    Discharge diagnosis: Term Pregnancy Delivered                                              Post partum procedures:none Augmentation: Pitocin ; forebag AROM Complications: None  Hospital course: Induction of Labor With Vaginal Delivery   29 y.o. yo G3P1011 at [redacted]w[redacted]d was admitted to the hospital 12/10/2024 for induction of labor.  Indication for induction: PROM.  Patient had an uncomplicated labor course. Membrane Rupture Time/Date: 2:00 PM,12/10/2024  Delivery Method:Vaginal, Spontaneous Operative Delivery:N/A Episiotomy: None Lacerations:  None Details of delivery can be found in separate delivery note.  Patient had a postpartum course complicated by***. Patient is discharged home 12/11/2024.  Newborn Data: Birth date:12/11/2024 Birth time:12:43 PM Gender:Female Living status:Living Apgars:9 ,9  Weight:2670 g (5lb 14.2oz)  Magnesium  Sulfate received: No BMZ received: No Rhophylac:N/A MMR:N/A T-DaP:declined prenatally Flu: No RSV Vaccine received: No Transfusion:No  Immunizations received: Immunization History  Administered Date(s) Administered    Influenza,inj,Quad PF,6+ Mos 11/11/2020, 10/16/2022   MMR 05/06/2021   Tdap 06/30/2014, 01/31/2021    Physical exam  Vitals:   12/11/24 1431 12/11/24 1438 12/11/24 1447 12/11/24 1501  BP: (!) 136/103 (!) 135/58 (!) 115/94 (P) 111/66  Pulse: 78 76 72 (P) 60  Resp:      Temp:    (P) 98.5 F (36.9 C)  TempSrc:    (P) Oral  SpO2:    (P) 99%  Weight:      Height:       General: {Exam; general:21111117} Lochia: {Desc; appropriate/inappropriate:30686::appropriate} Uterine Fundus: {Desc; firm/soft:30687} Incision: {Exam; incision:21111123} DVT Evaluation: {Exam; dvt:2111122} Labs: Lab Results  Component Value Date   WBC 10.7 (H) 12/11/2024   HGB 11.3 (L) 12/11/2024   HCT 34.9 (L) 12/11/2024   MCV 84.9 12/11/2024   PLT 187 12/11/2024      Latest Ref Rng & Units 07/07/2024    2:43 PM  CMP  Glucose 70 - 99 mg/dL 94   BUN 6 - 20 mg/dL 6   Creatinine 9.42 - 8.99 mg/dL 9.43   Sodium 865 - 855 mmol/L 137   Potassium 3.5 - 5.2 mmol/L 3.7   Chloride 96 - 106 mmol/L 103   CO2 20 - 29 mmol/L 20   Calcium 8.7 - 10.2 mg/dL 8.8   Total Protein 6.0 - 8.5 g/dL 6.2   Total Bilirubin 0.0 - 1.2 mg/dL 0.2   Alkaline Phos 44 - 121 IU/L 59   AST 0 - 40 IU/L 12   ALT 0 - 32 IU/L 9    Edinburgh Score:    06/20/2021    1:45 PM  Edinburgh Postnatal Depression Scale Screening Tool  I have been able to laugh and see the funny side of things. 0   I have looked forward with enjoyment to things. 0   I have blamed myself unnecessarily when things went wrong. 0   I have been anxious or worried for no good reason. 2   I have felt scared or panicky for no good reason. 0   Things have been getting on top of me. 0   I have been so unhappy that I have had difficulty sleeping. 0   I have felt sad or miserable. 0   I have been so unhappy that I have been crying. 1   The thought of harming myself has occurred to me. 0   Edinburgh Postnatal Depression Scale Total 3      Data saved with a previous  flowsheet row definition   No data recorded  After visit meds:  Allergies as of 12/11/2024       Reactions   Hydrocodone  Itching   SKIN BURNING. Pt states she can tolerate oxycodone      Med Rec must be completed prior to using this [facility]***        Discharge home in stable condition Infant Feeding: {Baby feeding:23562} Infant Disposition:{CHL IP OB HOME WITH FNUYZM:76418} Discharge instruction: per After Visit Summary and Postpartum booklet. Activity: Advance as tolerated. Pelvic rest for 6 weeks.  Diet: routine diet Future Appointments: Future Appointments  Date Time Provider Department Center  12/16/2024  3:15 PM [facility] HEALTH CLINICIAN [facility]  12/22/2024  3:15 PM Monica Donnice HERO, MD [facility]   Follow up Visit:  Vazquez Vazquez, CNM  P [facility] Admin Pool Please schedule this patient for Postpartum visit in: 6 weeks with the following provider: Any provider In-Person For C/S patients schedule nurse incision check in weeks 2 weeks: no High risk pregnancy complicated by: REACH patient; suboxone  w +cocaine  Delivery mode:  SVD Anticipated Birth Control:  Depo or IUD PP Procedures needed: routine Pap Schedule Integrated BH visit: no   12/11/2024 Vazquez Vazquez, CNM    "

## 2024-12-12 LAB — CBC
HCT: 29.3 % — ABNORMAL LOW (ref 36.0–46.0)
Hemoglobin: 9.4 g/dL — ABNORMAL LOW (ref 12.0–15.0)
MCH: 27.2 pg (ref 26.0–34.0)
MCHC: 32.1 g/dL (ref 30.0–36.0)
MCV: 84.9 fL (ref 80.0–100.0)
Platelets: 143 K/uL — ABNORMAL LOW (ref 150–400)
RBC: 3.45 MIL/uL — ABNORMAL LOW (ref 3.87–5.11)
RDW: 14.2 % (ref 11.5–15.5)
WBC: 9.8 K/uL (ref 4.0–10.5)
nRBC: 0 % (ref 0.0–0.2)

## 2024-12-12 MED ORDER — MEASLES, MUMPS & RUBELLA VAC ~~LOC~~ SUSR: 0.5000 mL | Freq: Once | SUBCUTANEOUS | Status: DC

## 2024-12-12 NOTE — Progress Notes (Signed)
 POSTPARTUM PROGRESS NOTE Postpartum Day 1  Subjective: Monica Vazquez is a 29 y.o. H6E7987 s/p NVD at [redacted]w[redacted]d.  She reports she is doing well. No acute events overnight. She denies any problems with ambulating, voiding or po intake. Denies nausea or vomiting.  Pain is well controlled.  Lochia is appropriate.  Objective: Blood pressure 131/81, pulse 82, temperature 98.2 F (36.8 C), temperature source Oral, resp. rate 18, height 5' 7 (1.702 m), weight 90.5 kg, last menstrual period 06/05/2023, SpO2 99%, unknown if currently breastfeeding.  Physical Exam:  General: alert, cooperative and no distress Heart:regular rate Resp: nonlabored Uterine Fundus: firm, appropriately tender Extremities:  Trace edema Skin: warm, dry  Recent Labs    12/11/24 0028 12/12/24 0439  HGB 11.3* 9.4*  HCT 34.9* 29.3*    Assessment/Plan: Monica Vazquez is a 29 y.o. H6E7987 s/p NVDat [redacted]w[redacted]d   PPD#1 - meeting milestones - continue routine postpartum care  OUD Established with [facility] clinic at [facility]. [facility] not [facility]/w rx.  - UDS+ on admit fentanyl  and cocain - Suboxone  8mg  BID  Hepatitis C - undetectable viral load 07/07/2024  Feeding: formula Contraception: depo   Dispo: Plan for discharge PPD.  LOS: 1 day   Barabara Maier, DO FM-OB Fellow Center for [employer]

## 2024-12-13 ENCOUNTER — Other Ambulatory Visit (HOSPITAL_COMMUNITY): Payer: Self-pay

## 2024-12-13 LAB — TOXASSURE FLEX 15, UR
6-ACETYLMORPHINE IA: NEGATIVE ng/mL
7-aminoclonazepam: NOT DETECTED ng/mg{creat}
AMPHETAMINES IA: NEGATIVE ng/mL
Alpha-hydroxyalprazolam: NOT DETECTED ng/mg{creat}
Alpha-hydroxymidazolam: NOT DETECTED ng/mg{creat}
Alpha-hydroxytriazolam: NOT DETECTED ng/mg{creat}
Alprazolam: NOT DETECTED ng/mg{creat}
BARBITURATES IA: NEGATIVE ng/mL
BUPRENORPHINE: POSITIVE
Benzodiazepines: NEGATIVE
Buprenorphine: 76 ng/mg{creat}
CANNABINOIDS IA: NEGATIVE ng/mL
Clonazepam: NOT DETECTED ng/mg{creat}
Creatinine: 37 mg/dL
Desalkylflurazepam: NOT DETECTED ng/mg{creat}
Desmethyldiazepam: NOT DETECTED ng/mg{creat}
Desmethylflunitrazepam: NOT DETECTED ng/mg{creat}
Diazepam: NOT DETECTED ng/mg{creat}
ETHYL ALCOHOL Enzymatic: NEGATIVE g/dL
FENTANYL: NEGATIVE
Fentanyl: NOT DETECTED ng/mg{creat}
Flunitrazepam: NOT DETECTED ng/mg{creat}
Lorazepam: NOT DETECTED ng/mg{creat}
METHADONE IA: NEGATIVE ng/mL
METHADONE MTB IA: NEGATIVE ng/mL
Midazolam: NOT DETECTED ng/mg{creat}
Norbuprenorphine: 481 ng/mg{creat}
Norfentanyl: NOT DETECTED ng/mg{creat}
OPIATE CLASS IA: NEGATIVE ng/mL
OXYCODONE CLASS IA: NEGATIVE ng/mL
Oxazepam: NOT DETECTED ng/mg{creat}
PHENCYCLIDINE IA: NEGATIVE ng/mL
TAPENTADOL, IA: NEGATIVE ng/mL
TRAMADOL IA: NEGATIVE ng/mL
Temazepam: NOT DETECTED ng/mg{creat}

## 2024-12-13 LAB — COCAINE AND MTB, MS, UR RFX
Benzoylecgonine: >13514 ng/mg{creat}
Cocaethylene: NOT DETECTED ng/mg{creat}
Cocaine Confirmation: POSITIVE
Cocaine: 1549 ng/mg{creat}

## 2024-12-13 MED ORDER — IBUPROFEN 600 MG PO TABS
600.0000 mg | ORAL_TABLET | Freq: Four times a day (QID) | ORAL | 0 refills | Status: AC | PRN
  Filled 2024-12-13: qty 30, 8d supply, fill #0

## 2024-12-13 MED ORDER — SUBOXONE 8-2 MG SL FILM
1.0000 | ORAL_FILM | Freq: Two times a day (BID) | SUBLINGUAL | 0 refills | Status: DC
  Filled 2024-12-13: qty 8, 4d supply, fill #0

## 2024-12-13 MED ORDER — IBUPROFEN 600 MG PO TABS
600.0000 mg | ORAL_TABLET | Freq: Four times a day (QID) | ORAL | 0 refills | Status: DC | PRN
  Filled 2024-12-13: qty 30, 8d supply, fill #0

## 2024-12-13 MED ORDER — IBUPROFEN 600 MG PO TABS: 600.0000 mg | ORAL_TABLET | Freq: Four times a day (QID) | ORAL | 0 refills | Status: DC | PRN

## 2024-12-13 NOTE — Progress Notes (Signed)
 At shift change patient not in her room. Grandmother and baby in room and grandmother feeding baby. Grandma states [name] outside smoking. Mom arrived back to room and RN educated mom and support person dad on not going outside and the support person needs to be with baby, if mom is away from room for any reason. RN reached out to provider to inform her of patient being outside and to see if communication order needs to be placed for mom to go outside due to her hx.

## 2024-12-13 NOTE — Progress Notes (Signed)
" ° °  Subjective:  Visited with Monica Vazquez in her postpartum room following delivery for ongoing substance exposed newborn consult as part of the [facility] team.     Objective:   Monica Vazquez is well appearing in no acute distress. She has linear thinking and clear communication.     Assessment and Plan:  Monica Vazquez stated that she is doing well and infant is not currently showing overt NOWS symptomology. Infant observed in her bassinet in safe sleep positioning, sleeping well, and self soothed when startled. [name] is feeding formula and stated that infant is feeding well. Discussed Monica Vazquez's desire to continue Soboxone treatment. Will discuss this plan with Monica Vazquez, CNM.   Encouraged Monica Vazquez to contact [facility] consult phone while infant remains inpatient for observation period and in between appointments with any further questions or concerns.    Monica Vazquez, NNP-BC Neonatal Nurse Practitioner Substance Exposed Newborn Consult at the [facility] [phone]   "
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
## 2024-12-13 NOTE — Progress Notes (Signed)
 Patient received last dose of night medication before discharge. MD V. Claudene called and will get patients medication sent to [facility] across the street. Patient stated she would be able to pick up tomorrow 1/12.

## 2024-12-14 ENCOUNTER — Other Ambulatory Visit (HOSPITAL_COMMUNITY): Payer: Self-pay

## 2024-12-14 NOTE — Anesthesia Postprocedure Evaluation (Signed)
"   Anesthesia Post Note  Patient: Monica Vazquez  Procedure(s) Performed: AN AD HOC LABOR EPIDURAL     Anesthesia Type: Epidural Vital Signs Assessment: post-procedure vital signs reviewed and stable Anesthetic complications: no Comments: Patient discharged prior to post op evaluation. VSS, nursing, and physician notes reviewed, no apparent complications   No notable events documented.  Last Vitals: There were no vitals filed for this visit.  Last Pain: There were no vitals filed for this visit.               Rome Ade      "

## 2024-12-15 ENCOUNTER — Encounter: Payer: Self-pay | Admitting: Advanced Practice Midwife

## 2024-12-15 DIAGNOSIS — Z5181 Encounter for therapeutic drug level monitoring: Secondary | ICD-10-CM

## 2024-12-16 ENCOUNTER — Other Ambulatory Visit: Payer: Self-pay | Admitting: Advanced Practice Midwife

## 2024-12-16 ENCOUNTER — Other Ambulatory Visit: Payer: Self-pay

## 2024-12-16 DIAGNOSIS — Z9189 Other specified personal risk factors, not elsewhere classified: Secondary | ICD-10-CM

## 2024-12-16 DIAGNOSIS — Z5181 Encounter for therapeutic drug level monitoring: Secondary | ICD-10-CM

## 2024-12-16 DIAGNOSIS — F1121 Opioid dependence, in remission: Secondary | ICD-10-CM

## 2024-12-16 DIAGNOSIS — F1120 Opioid dependence, uncomplicated: Secondary | ICD-10-CM

## 2024-12-16 MED ORDER — SUBOXONE 8-2 MG SL FILM: 1.0000 | ORAL_FILM | Freq: Two times a day (BID) | SUBLINGUAL | 0 refills | Status: DC

## 2024-12-22 ENCOUNTER — Other Ambulatory Visit: Payer: Self-pay | Admitting: Advanced Practice Midwife

## 2024-12-22 ENCOUNTER — Other Ambulatory Visit: Payer: Self-pay

## 2024-12-22 ENCOUNTER — Telehealth (HOSPITAL_COMMUNITY): Payer: Self-pay | Admitting: *Deleted

## 2024-12-22 ENCOUNTER — Ambulatory Visit: Payer: MEDICAID | Admitting: Family Medicine

## 2024-12-22 DIAGNOSIS — F1121 Opioid dependence, in remission: Secondary | ICD-10-CM

## 2024-12-22 DIAGNOSIS — F1120 Opioid dependence, uncomplicated: Secondary | ICD-10-CM

## 2024-12-22 MED ORDER — SUBOXONE 8-2 MG SL FILM: 1.0000 | ORAL_FILM | Freq: Two times a day (BID) | SUBLINGUAL | 0 refills | Status: DC

## 2024-12-22 NOTE — Telephone Encounter (Signed)
 12/22/2024  Name: Monica Vazquez MRN: [id number] DOB: [birth year]  Reason for Call:  Transition of Care Hospital Discharge Call  Contact Status: Patient Contact Status: Complete  Language assistant needed: Interpreter Mode: Interpreter Not Needed        Follow-Up Questions: Do You Have Any Concerns About Your Health As You Heal From Delivery?: No Do You Have Any Concerns About Your Infants Health?: No  Edinburgh Postnatal Depression Scale:  In the Past 7 Days: I have been able to laugh and see the funny side of things.: As much as I always could I have looked forward with enjoyment to things.: As much as I ever did I have blamed myself unnecessarily when things went wrong.: Yes, some of the time I have been anxious or worried for no good reason.: Hardly ever I have felt scared or panicky for no good reason.: No, not at all Things have been getting on top of me.: No, I have been coping as well as ever I have been so unhappy that I have had difficulty sleeping.: Not at all I have felt sad or miserable.: No, not at all I have been so unhappy that I have been crying.: No, never The thought of harming myself has occurred to me.: Never Edinburgh Postnatal Depression Scale Total: 3  PHQ2-9 Depression Scale:     Discharge Follow-up: Edinburgh score requires follow up?: No Patient was advised of the following resources:: Support Group, Breastfeeding Support Group  Post-discharge interventions: Reviewed Newborn Safe Sleep Practices  Mliss Sieve, RN 12/22/2024 15:03

## 2024-12-23 ENCOUNTER — Ambulatory Visit: Payer: Self-pay | Admitting: Advanced Practice Midwife

## 2024-12-23 DIAGNOSIS — F1121 Opioid dependence, in remission: Secondary | ICD-10-CM

## 2024-12-23 DIAGNOSIS — F1120 Opioid dependence, uncomplicated: Secondary | ICD-10-CM

## 2024-12-23 LAB — TOXASSURE FLEX 15, UR
6-ACETYLMORPHINE IA: NEGATIVE ng/mL
7-aminoclonazepam: NOT DETECTED ng/mg{creat}
AMPHETAMINES IA: NEGATIVE ng/mL
Alpha-hydroxyalprazolam: NOT DETECTED ng/mg{creat}
Alpha-hydroxymidazolam: NOT DETECTED ng/mg{creat}
Alpha-hydroxytriazolam: NOT DETECTED ng/mg{creat}
Alprazolam: NOT DETECTED ng/mg{creat}
BARBITURATES IA: NEGATIVE ng/mL
BUPRENORPHINE: POSITIVE
Benzodiazepines: NEGATIVE
Buprenorphine: 248 ng/mg{creat}
COCAINE METABOLITE IA: NEGATIVE ng/mL
Clonazepam: NOT DETECTED ng/mg{creat}
Creatinine: 31 mg/dL
Desalkylflurazepam: NOT DETECTED ng/mg{creat}
Desmethyldiazepam: NOT DETECTED ng/mg{creat}
Desmethylflunitrazepam: NOT DETECTED ng/mg{creat}
Diazepam: NOT DETECTED ng/mg{creat}
ETHYL ALCOHOL Enzymatic: NEGATIVE g/dL
FENTANYL: NEGATIVE
Fentanyl: NOT DETECTED ng/mg{creat}
Flunitrazepam: NOT DETECTED ng/mg{creat}
Lorazepam: NOT DETECTED ng/mg{creat}
METHADONE IA: NEGATIVE ng/mL
METHADONE MTB IA: NEGATIVE ng/mL
Midazolam: NOT DETECTED ng/mg{creat}
Norbuprenorphine: 571 ng/mg{creat}
Norfentanyl: NOT DETECTED ng/mg{creat}
OPIATE CLASS IA: NEGATIVE ng/mL
OXYCODONE CLASS IA: NEGATIVE ng/mL
Oxazepam: NOT DETECTED ng/mg{creat}
PHENCYCLIDINE IA: NEGATIVE ng/mL
TAPENTADOL, IA: NEGATIVE ng/mL
TRAMADOL IA: NEGATIVE ng/mL
Temazepam: NOT DETECTED ng/mg{creat}

## 2024-12-23 LAB — CANNABINOIDS, MS, UR RFX
Cannabinoids Confirmation: POSITIVE
Carboxy-THC: 48 ng/mg{creat}

## 2024-12-27 LAB — TOXASSURE SELECT 13 (MW), URINE

## 2024-12-28 ENCOUNTER — Ambulatory Visit: Payer: Self-pay | Admitting: Family Medicine

## 2024-12-31 ENCOUNTER — Ambulatory Visit: Payer: Self-pay | Admitting: Clinical

## 2024-12-31 DIAGNOSIS — Z91199 Patient's noncompliance with other medical treatment and regimen due to unspecified reason: Secondary | ICD-10-CM

## 2024-12-31 NOTE — BH Specialist Note (Signed)
 Pt did not arrive to video visit and did not answer the phone; Unable to leave voice message; left MyChart message for patient.

## 2025-01-01 MED ORDER — SUBOXONE 8-2 MG SL FILM: 1.0000 | ORAL_FILM | Freq: Two times a day (BID) | SUBLINGUAL | 0 refills | Status: AC

## 2025-01-05 ENCOUNTER — Ambulatory Visit: Payer: MEDICAID | Admitting: Family Medicine

## 2025-01-12 ENCOUNTER — Ambulatory Visit: Payer: MEDICAID | Admitting: Family Medicine

## 2025-01-19 ENCOUNTER — Ambulatory Visit: Payer: Self-pay | Admitting: Advanced Practice Midwife
